# Patient Record
Sex: Female | Born: 1955 | Race: Black or African American | Hispanic: No | Marital: Married | Smoking: Former smoker
Health system: Southern US, Community
[De-identification: ages and names within clinical notes are randomized; demographics above are authoritative.]

## PROBLEM LIST (undated history)

## (undated) DIAGNOSIS — R001 Bradycardia, unspecified: Secondary | ICD-10-CM

## (undated) DIAGNOSIS — I2510 Atherosclerotic heart disease of native coronary artery without angina pectoris: Secondary | ICD-10-CM

## (undated) DIAGNOSIS — E785 Hyperlipidemia, unspecified: Secondary | ICD-10-CM

## (undated) DIAGNOSIS — M549 Dorsalgia, unspecified: Secondary | ICD-10-CM

## (undated) DIAGNOSIS — I10 Essential (primary) hypertension: Secondary | ICD-10-CM

## (undated) DIAGNOSIS — G4730 Sleep apnea, unspecified: Secondary | ICD-10-CM

## (undated) DIAGNOSIS — H409 Unspecified glaucoma: Secondary | ICD-10-CM

## (undated) DIAGNOSIS — M25552 Pain in left hip: Secondary | ICD-10-CM

## (undated) DIAGNOSIS — R51 Headache: Secondary | ICD-10-CM

## (undated) DIAGNOSIS — M25522 Pain in left elbow: Secondary | ICD-10-CM

## (undated) DIAGNOSIS — G8929 Other chronic pain: Secondary | ICD-10-CM

## (undated) DIAGNOSIS — R519 Headache, unspecified: Secondary | ICD-10-CM

## (undated) DIAGNOSIS — K219 Gastro-esophageal reflux disease without esophagitis: Secondary | ICD-10-CM

## (undated) DIAGNOSIS — M1990 Unspecified osteoarthritis, unspecified site: Secondary | ICD-10-CM

## (undated) DIAGNOSIS — F329 Major depressive disorder, single episode, unspecified: Secondary | ICD-10-CM

## (undated) DIAGNOSIS — F32A Depression, unspecified: Secondary | ICD-10-CM

## (undated) HISTORY — DX: Headache: R51

## (undated) HISTORY — DX: Atherosclerotic heart disease of native coronary artery without angina pectoris: I25.10

## (undated) HISTORY — DX: Hyperlipidemia, unspecified: E78.5

## (undated) HISTORY — DX: Sleep apnea, unspecified: G47.30

## (undated) HISTORY — DX: Unspecified osteoarthritis, unspecified site: M19.90

## (undated) HISTORY — DX: Other chronic pain: G89.29

## (undated) HISTORY — DX: Headache, unspecified: R51.9

## (undated) HISTORY — PX: ABDOMINAL HYSTERECTOMY: SHX81

## (undated) HISTORY — PX: APPENDECTOMY: SHX54

## (undated) HISTORY — DX: Gastro-esophageal reflux disease without esophagitis: K21.9

## (undated) HISTORY — DX: Major depressive disorder, single episode, unspecified: F32.9

## (undated) HISTORY — DX: Depression, unspecified: F32.A

## (undated) HISTORY — DX: Bradycardia, unspecified: R00.1

---

## 1997-08-31 ENCOUNTER — Emergency Department (HOSPITAL_COMMUNITY): Admission: EM | Admit: 1997-08-31 | Discharge: 1997-08-31 | Payer: Self-pay | Admitting: Internal Medicine

## 1997-09-15 ENCOUNTER — Emergency Department (HOSPITAL_COMMUNITY): Admission: EM | Admit: 1997-09-15 | Discharge: 1997-09-15 | Payer: Self-pay | Admitting: Emergency Medicine

## 1997-09-26 ENCOUNTER — Other Ambulatory Visit: Admission: RE | Admit: 1997-09-26 | Discharge: 1997-09-26 | Payer: Self-pay | Admitting: Family Medicine

## 1999-06-27 ENCOUNTER — Encounter: Payer: Self-pay | Admitting: Emergency Medicine

## 1999-06-27 ENCOUNTER — Emergency Department (HOSPITAL_COMMUNITY): Admission: EM | Admit: 1999-06-27 | Discharge: 1999-06-27 | Payer: Self-pay | Admitting: Emergency Medicine

## 2000-05-03 ENCOUNTER — Encounter: Payer: Self-pay | Admitting: Emergency Medicine

## 2000-05-03 ENCOUNTER — Emergency Department (HOSPITAL_COMMUNITY): Admission: EM | Admit: 2000-05-03 | Discharge: 2000-05-03 | Payer: Self-pay | Admitting: Emergency Medicine

## 2000-05-11 ENCOUNTER — Encounter: Admission: RE | Admit: 2000-05-11 | Discharge: 2000-07-02 | Payer: Self-pay | Admitting: Orthopedic Surgery

## 2000-06-23 ENCOUNTER — Ambulatory Visit (HOSPITAL_COMMUNITY): Admission: RE | Admit: 2000-06-23 | Discharge: 2000-06-23 | Payer: Self-pay

## 2001-10-30 ENCOUNTER — Emergency Department (HOSPITAL_COMMUNITY): Admission: EM | Admit: 2001-10-30 | Discharge: 2001-10-30 | Payer: Self-pay | Admitting: Emergency Medicine

## 2001-11-14 ENCOUNTER — Emergency Department (HOSPITAL_COMMUNITY): Admission: EM | Admit: 2001-11-14 | Discharge: 2001-11-14 | Payer: Self-pay | Admitting: Emergency Medicine

## 2001-12-06 ENCOUNTER — Encounter: Payer: Self-pay | Admitting: Family Medicine

## 2001-12-06 ENCOUNTER — Ambulatory Visit (HOSPITAL_COMMUNITY): Admission: RE | Admit: 2001-12-06 | Discharge: 2001-12-06 | Payer: Self-pay | Admitting: Family Medicine

## 2001-12-29 ENCOUNTER — Emergency Department (HOSPITAL_COMMUNITY): Admission: EM | Admit: 2001-12-29 | Discharge: 2001-12-29 | Payer: Self-pay | Admitting: *Deleted

## 2002-06-27 ENCOUNTER — Emergency Department (HOSPITAL_COMMUNITY): Admission: EM | Admit: 2002-06-27 | Discharge: 2002-06-27 | Payer: Self-pay | Admitting: Emergency Medicine

## 2002-06-28 ENCOUNTER — Ambulatory Visit: Admission: RE | Admit: 2002-06-28 | Discharge: 2002-06-28 | Payer: Self-pay | Admitting: Emergency Medicine

## 2002-08-03 ENCOUNTER — Encounter: Payer: Self-pay | Admitting: Emergency Medicine

## 2002-08-04 ENCOUNTER — Inpatient Hospital Stay (HOSPITAL_COMMUNITY): Admission: EM | Admit: 2002-08-04 | Discharge: 2002-08-08 | Payer: Self-pay | Admitting: Emergency Medicine

## 2002-11-05 ENCOUNTER — Emergency Department (HOSPITAL_COMMUNITY): Admission: EM | Admit: 2002-11-05 | Discharge: 2002-11-05 | Payer: Self-pay | Admitting: Emergency Medicine

## 2003-04-04 ENCOUNTER — Ambulatory Visit (HOSPITAL_COMMUNITY): Admission: RE | Admit: 2003-04-04 | Discharge: 2003-04-04 | Payer: Self-pay | Admitting: Family Medicine

## 2003-08-07 ENCOUNTER — Inpatient Hospital Stay (HOSPITAL_COMMUNITY): Admission: EM | Admit: 2003-08-07 | Discharge: 2003-08-10 | Payer: Self-pay | Admitting: Emergency Medicine

## 2003-12-18 ENCOUNTER — Ambulatory Visit: Payer: Self-pay | Admitting: Family Medicine

## 2003-12-26 ENCOUNTER — Ambulatory Visit (HOSPITAL_COMMUNITY): Admission: RE | Admit: 2003-12-26 | Discharge: 2003-12-26 | Payer: Self-pay | Admitting: Family Medicine

## 2004-01-01 ENCOUNTER — Ambulatory Visit: Payer: Self-pay | Admitting: *Deleted

## 2004-02-18 ENCOUNTER — Ambulatory Visit: Payer: Self-pay | Admitting: Family Medicine

## 2004-04-08 ENCOUNTER — Ambulatory Visit: Payer: Self-pay | Admitting: Family Medicine

## 2004-04-10 ENCOUNTER — Ambulatory Visit: Payer: Self-pay | Admitting: *Deleted

## 2004-04-10 ENCOUNTER — Ambulatory Visit: Payer: Self-pay | Admitting: Family Medicine

## 2004-04-23 ENCOUNTER — Ambulatory Visit: Payer: Self-pay | Admitting: Internal Medicine

## 2004-04-23 ENCOUNTER — Ambulatory Visit (HOSPITAL_BASED_OUTPATIENT_CLINIC_OR_DEPARTMENT_OTHER): Admission: RE | Admit: 2004-04-23 | Discharge: 2004-04-23 | Payer: Self-pay | Admitting: Family Medicine

## 2004-04-30 ENCOUNTER — Ambulatory Visit: Payer: Self-pay | Admitting: Family Medicine

## 2004-05-16 ENCOUNTER — Ambulatory Visit: Payer: Self-pay | Admitting: Family Medicine

## 2004-05-26 ENCOUNTER — Ambulatory Visit (HOSPITAL_COMMUNITY): Admission: RE | Admit: 2004-05-26 | Discharge: 2004-05-26 | Payer: Self-pay | Admitting: Family Medicine

## 2004-06-02 ENCOUNTER — Ambulatory Visit: Payer: Self-pay | Admitting: Family Medicine

## 2004-06-02 DIAGNOSIS — L439 Lichen planus, unspecified: Secondary | ICD-10-CM

## 2004-06-23 ENCOUNTER — Ambulatory Visit: Payer: Self-pay | Admitting: Family Medicine

## 2004-09-24 ENCOUNTER — Observation Stay (HOSPITAL_COMMUNITY): Admission: EM | Admit: 2004-09-24 | Discharge: 2004-09-24 | Payer: Self-pay | Admitting: Emergency Medicine

## 2004-10-06 ENCOUNTER — Ambulatory Visit: Payer: Self-pay | Admitting: Family Medicine

## 2004-10-08 ENCOUNTER — Ambulatory Visit: Payer: Self-pay | Admitting: Family Medicine

## 2004-11-17 ENCOUNTER — Ambulatory Visit: Payer: Self-pay | Admitting: Family Medicine

## 2005-01-19 ENCOUNTER — Ambulatory Visit: Payer: Self-pay | Admitting: Family Medicine

## 2005-05-06 ENCOUNTER — Ambulatory Visit: Payer: Self-pay | Admitting: Family Medicine

## 2005-06-04 ENCOUNTER — Ambulatory Visit: Payer: Self-pay | Admitting: Family Medicine

## 2005-07-06 ENCOUNTER — Ambulatory Visit: Payer: Self-pay | Admitting: Family Medicine

## 2005-09-02 ENCOUNTER — Ambulatory Visit: Payer: Self-pay | Admitting: Family Medicine

## 2005-09-03 ENCOUNTER — Ambulatory Visit: Payer: Self-pay | Admitting: *Deleted

## 2005-10-05 ENCOUNTER — Ambulatory Visit: Payer: Self-pay | Admitting: Family Medicine

## 2005-10-27 ENCOUNTER — Ambulatory Visit: Payer: Self-pay | Admitting: Family Medicine

## 2005-10-27 LAB — CONVERTED CEMR LAB: Pap Smear: NORMAL

## 2005-11-05 ENCOUNTER — Ambulatory Visit: Payer: Self-pay | Admitting: Family Medicine

## 2005-11-12 ENCOUNTER — Ambulatory Visit (HOSPITAL_COMMUNITY): Admission: RE | Admit: 2005-11-12 | Discharge: 2005-11-12 | Payer: Self-pay | Admitting: Family Medicine

## 2005-12-14 ENCOUNTER — Ambulatory Visit: Payer: Self-pay | Admitting: Family Medicine

## 2005-12-16 ENCOUNTER — Ambulatory Visit: Payer: Self-pay | Admitting: Family Medicine

## 2006-06-14 ENCOUNTER — Ambulatory Visit: Payer: Self-pay | Admitting: Family Medicine

## 2006-08-30 ENCOUNTER — Ambulatory Visit: Payer: Self-pay | Admitting: Family Medicine

## 2006-12-03 ENCOUNTER — Encounter (INDEPENDENT_AMBULATORY_CARE_PROVIDER_SITE_OTHER): Payer: Self-pay | Admitting: Family Medicine

## 2006-12-03 DIAGNOSIS — I10 Essential (primary) hypertension: Secondary | ICD-10-CM | POA: Insufficient documentation

## 2006-12-08 ENCOUNTER — Encounter (INDEPENDENT_AMBULATORY_CARE_PROVIDER_SITE_OTHER): Payer: Self-pay | Admitting: *Deleted

## 2006-12-21 ENCOUNTER — Ambulatory Visit: Payer: Self-pay | Admitting: Family Medicine

## 2006-12-23 ENCOUNTER — Ambulatory Visit: Payer: Self-pay | Admitting: Family Medicine

## 2007-01-18 ENCOUNTER — Ambulatory Visit: Payer: Self-pay | Admitting: Family Medicine

## 2007-01-18 DIAGNOSIS — J45909 Unspecified asthma, uncomplicated: Secondary | ICD-10-CM | POA: Insufficient documentation

## 2007-01-18 DIAGNOSIS — F329 Major depressive disorder, single episode, unspecified: Secondary | ICD-10-CM

## 2007-01-18 DIAGNOSIS — E119 Type 2 diabetes mellitus without complications: Secondary | ICD-10-CM

## 2007-01-18 DIAGNOSIS — G4733 Obstructive sleep apnea (adult) (pediatric): Secondary | ICD-10-CM

## 2007-03-19 ENCOUNTER — Emergency Department (HOSPITAL_COMMUNITY): Admission: EM | Admit: 2007-03-19 | Discharge: 2007-03-20 | Payer: Self-pay | Admitting: Emergency Medicine

## 2007-05-17 ENCOUNTER — Telehealth (INDEPENDENT_AMBULATORY_CARE_PROVIDER_SITE_OTHER): Payer: Self-pay | Admitting: *Deleted

## 2007-05-18 ENCOUNTER — Ambulatory Visit: Payer: Self-pay | Admitting: Nurse Practitioner

## 2007-05-18 DIAGNOSIS — H6690 Otitis media, unspecified, unspecified ear: Secondary | ICD-10-CM | POA: Insufficient documentation

## 2007-07-25 ENCOUNTER — Ambulatory Visit: Payer: Self-pay | Admitting: Family Medicine

## 2007-07-25 DIAGNOSIS — M2550 Pain in unspecified joint: Secondary | ICD-10-CM | POA: Insufficient documentation

## 2007-07-25 LAB — CONVERTED CEMR LAB
ALT: 20 units/L (ref 0–35)
Basophils Relative: 0 % (ref 0–1)
Blood Glucose, Fingerstick: 101
Chloride: 107 meq/L (ref 96–112)
Cholesterol: 145 mg/dL (ref 0–200)
Eosinophils Absolute: 0.1 10*3/uL (ref 0.0–0.7)
Eosinophils Relative: 2 % (ref 0–5)
Hemoglobin: 12.3 g/dL (ref 12.0–15.0)
Hgb A1c MFr Bld: 6.3 %
Lymphocytes Relative: 44 % (ref 12–46)
Neutro Abs: 2.7 10*3/uL (ref 1.7–7.7)
Neutrophils Relative %: 49 % (ref 43–77)
RBC: 4.34 M/uL (ref 3.87–5.11)
RDW: 15.9 % — ABNORMAL HIGH (ref 11.5–15.5)
Rhuematoid fact SerPl-aCnc: 28 intl units/mL — ABNORMAL HIGH (ref 0–20)
TSH: 1.163 microintl units/mL (ref 0.350–5.50)
Total Bilirubin: 0.3 mg/dL (ref 0.3–1.2)
Total Protein: 7.3 g/dL (ref 6.0–8.3)
VLDL: 13 mg/dL (ref 0–40)

## 2007-08-08 ENCOUNTER — Encounter (INDEPENDENT_AMBULATORY_CARE_PROVIDER_SITE_OTHER): Payer: Self-pay | Admitting: Family Medicine

## 2007-08-29 ENCOUNTER — Ambulatory Visit: Payer: Self-pay | Admitting: Family Medicine

## 2007-08-29 LAB — CONVERTED CEMR LAB: Blood Glucose, Fingerstick: 103

## 2007-09-06 ENCOUNTER — Encounter (INDEPENDENT_AMBULATORY_CARE_PROVIDER_SITE_OTHER): Payer: Self-pay | Admitting: Family Medicine

## 2007-09-12 LAB — CONVERTED CEMR LAB
Sed Rate: 40 mm/hr — ABNORMAL HIGH (ref 0–22)
Total CK: 66 units/L (ref 7–177)

## 2007-12-12 ENCOUNTER — Telehealth (INDEPENDENT_AMBULATORY_CARE_PROVIDER_SITE_OTHER): Payer: Self-pay | Admitting: *Deleted

## 2007-12-13 ENCOUNTER — Ambulatory Visit: Payer: Self-pay | Admitting: Family Medicine

## 2007-12-13 ENCOUNTER — Telehealth (INDEPENDENT_AMBULATORY_CARE_PROVIDER_SITE_OTHER): Payer: Self-pay | Admitting: Family Medicine

## 2007-12-13 DIAGNOSIS — R1084 Generalized abdominal pain: Secondary | ICD-10-CM | POA: Insufficient documentation

## 2007-12-13 DIAGNOSIS — M6740 Ganglion, unspecified site: Secondary | ICD-10-CM | POA: Insufficient documentation

## 2007-12-13 LAB — CONVERTED CEMR LAB: Blood Glucose, Fingerstick: 99

## 2007-12-15 ENCOUNTER — Encounter (INDEPENDENT_AMBULATORY_CARE_PROVIDER_SITE_OTHER): Payer: Self-pay | Admitting: Family Medicine

## 2008-01-11 ENCOUNTER — Ambulatory Visit: Payer: Self-pay | Admitting: Family Medicine

## 2008-01-13 ENCOUNTER — Ambulatory Visit: Payer: Self-pay | Admitting: Family Medicine

## 2008-01-20 ENCOUNTER — Emergency Department (HOSPITAL_COMMUNITY): Admission: EM | Admit: 2008-01-20 | Discharge: 2008-01-21 | Payer: Self-pay | Admitting: Emergency Medicine

## 2008-01-23 ENCOUNTER — Telehealth (INDEPENDENT_AMBULATORY_CARE_PROVIDER_SITE_OTHER): Payer: Self-pay | Admitting: *Deleted

## 2008-04-07 ENCOUNTER — Emergency Department (HOSPITAL_COMMUNITY): Admission: EM | Admit: 2008-04-07 | Discharge: 2008-04-08 | Payer: Self-pay | Admitting: Emergency Medicine

## 2008-04-09 ENCOUNTER — Telehealth (INDEPENDENT_AMBULATORY_CARE_PROVIDER_SITE_OTHER): Payer: Self-pay | Admitting: *Deleted

## 2008-05-22 ENCOUNTER — Ambulatory Visit: Payer: Self-pay | Admitting: Family Medicine

## 2008-05-22 DIAGNOSIS — I498 Other specified cardiac arrhythmias: Secondary | ICD-10-CM | POA: Insufficient documentation

## 2008-05-23 ENCOUNTER — Ambulatory Visit: Payer: Self-pay | Admitting: Internal Medicine

## 2008-05-23 ENCOUNTER — Ambulatory Visit: Payer: Self-pay

## 2008-05-23 ENCOUNTER — Encounter: Payer: Self-pay | Admitting: Internal Medicine

## 2008-05-23 DIAGNOSIS — R5381 Other malaise: Secondary | ICD-10-CM

## 2008-05-23 DIAGNOSIS — R5383 Other fatigue: Secondary | ICD-10-CM

## 2008-05-23 DIAGNOSIS — R002 Palpitations: Secondary | ICD-10-CM | POA: Insufficient documentation

## 2008-05-24 ENCOUNTER — Encounter (INDEPENDENT_AMBULATORY_CARE_PROVIDER_SITE_OTHER): Payer: Self-pay | Admitting: Family Medicine

## 2008-07-25 ENCOUNTER — Ambulatory Visit: Payer: Self-pay | Admitting: Nurse Practitioner

## 2008-07-25 DIAGNOSIS — N760 Acute vaginitis: Secondary | ICD-10-CM | POA: Insufficient documentation

## 2008-07-25 LAB — CONVERTED CEMR LAB
Blood in Urine, dipstick: NEGATIVE
Nitrite: NEGATIVE
Protein, U semiquant: NEGATIVE
Urobilinogen, UA: 0.2
WBC Urine, dipstick: NEGATIVE

## 2008-08-13 ENCOUNTER — Ambulatory Visit: Payer: Self-pay | Admitting: Family Medicine

## 2008-08-14 ENCOUNTER — Encounter (INDEPENDENT_AMBULATORY_CARE_PROVIDER_SITE_OTHER): Payer: Self-pay | Admitting: Family Medicine

## 2008-08-14 LAB — CONVERTED CEMR LAB
AST: 15 units/L (ref 0–37)
Albumin: 4.1 g/dL (ref 3.5–5.2)
Basophils Relative: 0 % (ref 0–1)
CO2: 24 meq/L (ref 19–32)
Chloride: 105 meq/L (ref 96–112)
Cholesterol: 144 mg/dL (ref 0–200)
HCT: 42.7 % (ref 36.0–46.0)
HDL: 42 mg/dL (ref >39)
Hemoglobin: 13.1 g/dL (ref 12.0–15.0)
MCHC: 30.7 g/dL (ref 30.0–36.0)
Neutro Abs: 2.2 10*3/uL (ref 1.7–7.7)
Neutrophils Relative %: 47 % (ref 43–77)
RDW: 15.9 % — ABNORMAL HIGH (ref 11.5–15.5)
Sodium: 141 meq/L (ref 135–145)
Total Bilirubin: 0.3 mg/dL (ref 0.3–1.2)
Total Protein: 6.9 g/dL (ref 6.0–8.3)
Triglycerides: 82 mg/dL (ref <150)
VLDL: 16 mg/dL (ref 0–40)

## 2008-08-17 ENCOUNTER — Ambulatory Visit (HOSPITAL_COMMUNITY): Admission: RE | Admit: 2008-08-17 | Discharge: 2008-08-17 | Payer: Self-pay | Admitting: Family Medicine

## 2008-12-11 ENCOUNTER — Emergency Department (HOSPITAL_COMMUNITY): Admission: EM | Admit: 2008-12-11 | Discharge: 2008-12-12 | Payer: Self-pay | Admitting: Emergency Medicine

## 2008-12-20 ENCOUNTER — Ambulatory Visit: Payer: Self-pay | Admitting: Nurse Practitioner

## 2008-12-20 DIAGNOSIS — R55 Syncope and collapse: Secondary | ICD-10-CM

## 2008-12-20 LAB — CONVERTED CEMR LAB
Bilirubin Urine: NEGATIVE
Blood Glucose, Fingerstick: 82
Glucose, Urine, Semiquant: NEGATIVE
Ketones, urine, test strip: NEGATIVE
Microalb, Ur: 0.75 mg/dL (ref 0.00–1.89)
Nitrite: NEGATIVE
Specific Gravity, Urine: 1.025
Urobilinogen, UA: 0.2
WBC Urine, dipstick: NEGATIVE

## 2008-12-21 ENCOUNTER — Encounter (INDEPENDENT_AMBULATORY_CARE_PROVIDER_SITE_OTHER): Payer: Self-pay | Admitting: Nurse Practitioner

## 2009-01-09 ENCOUNTER — Ambulatory Visit: Payer: Self-pay | Admitting: Nurse Practitioner

## 2009-01-11 ENCOUNTER — Ambulatory Visit: Payer: Self-pay | Admitting: Nurse Practitioner

## 2009-05-01 ENCOUNTER — Telehealth (INDEPENDENT_AMBULATORY_CARE_PROVIDER_SITE_OTHER): Payer: Self-pay | Admitting: Nurse Practitioner

## 2009-05-07 ENCOUNTER — Ambulatory Visit: Payer: Self-pay | Admitting: Nurse Practitioner

## 2009-05-07 DIAGNOSIS — I8390 Asymptomatic varicose veins of unspecified lower extremity: Secondary | ICD-10-CM

## 2009-05-07 LAB — CONVERTED CEMR LAB
Bilirubin Urine: NEGATIVE
Protein, U semiquant: NEGATIVE
Urobilinogen, UA: 0.2

## 2009-05-08 ENCOUNTER — Encounter (INDEPENDENT_AMBULATORY_CARE_PROVIDER_SITE_OTHER): Payer: Self-pay | Admitting: Nurse Practitioner

## 2009-06-27 ENCOUNTER — Telehealth (INDEPENDENT_AMBULATORY_CARE_PROVIDER_SITE_OTHER): Payer: Self-pay | Admitting: Nurse Practitioner

## 2009-07-08 ENCOUNTER — Encounter (INDEPENDENT_AMBULATORY_CARE_PROVIDER_SITE_OTHER): Payer: Self-pay | Admitting: *Deleted

## 2009-07-22 ENCOUNTER — Ambulatory Visit: Payer: Self-pay | Admitting: Nurse Practitioner

## 2009-07-22 DIAGNOSIS — K219 Gastro-esophageal reflux disease without esophagitis: Secondary | ICD-10-CM | POA: Insufficient documentation

## 2009-09-25 ENCOUNTER — Ambulatory Visit: Payer: Self-pay | Admitting: Nurse Practitioner

## 2009-09-25 DIAGNOSIS — M5430 Sciatica, unspecified side: Secondary | ICD-10-CM | POA: Insufficient documentation

## 2009-10-16 ENCOUNTER — Ambulatory Visit: Payer: Self-pay | Admitting: Nurse Practitioner

## 2009-10-16 DIAGNOSIS — E669 Obesity, unspecified: Secondary | ICD-10-CM

## 2009-10-16 LAB — CONVERTED CEMR LAB
ALT: 21 units/L (ref 0–35)
AST: 13 units/L (ref 0–37)
Albumin: 4.2 g/dL (ref 3.5–5.2)
Alkaline Phosphatase: 56 units/L (ref 39–117)
BUN: 15 mg/dL (ref 6–23)
Basophils Absolute: 0 10*3/uL (ref 0.0–0.1)
Basophils Relative: 1 % (ref 0–1)
Blood Glucose, Fingerstick: 107
Eosinophils Absolute: 0.1 10*3/uL (ref 0.0–0.7)
MCHC: 32.3 g/dL (ref 30.0–36.0)
MCV: 90.2 fL (ref 78.0–100.0)
Neutrophils Relative %: 45 % (ref 43–77)
Platelets: 360 10*3/uL (ref 150–400)
Potassium: 4.1 meq/L (ref 3.5–5.3)
RDW: 15.5 % (ref 11.5–15.5)
Sodium: 145 meq/L (ref 135–145)
Total Protein: 7.1 g/dL (ref 6.0–8.3)

## 2009-10-22 ENCOUNTER — Encounter (INDEPENDENT_AMBULATORY_CARE_PROVIDER_SITE_OTHER): Payer: Self-pay | Admitting: Nurse Practitioner

## 2009-11-13 ENCOUNTER — Ambulatory Visit: Payer: Self-pay | Admitting: Nurse Practitioner

## 2009-11-13 LAB — CONVERTED CEMR LAB
HDL: 43 mg/dL (ref >39)
Total CHOL/HDL Ratio: 3.3

## 2009-11-14 ENCOUNTER — Encounter (INDEPENDENT_AMBULATORY_CARE_PROVIDER_SITE_OTHER): Payer: Self-pay | Admitting: Nurse Practitioner

## 2009-11-21 ENCOUNTER — Ambulatory Visit (HOSPITAL_COMMUNITY): Admission: RE | Admit: 2009-11-21 | Discharge: 2009-11-21 | Payer: Self-pay | Admitting: Internal Medicine

## 2010-01-08 ENCOUNTER — Ambulatory Visit: Payer: Self-pay | Admitting: Nurse Practitioner

## 2010-01-10 ENCOUNTER — Ambulatory Visit: Payer: Self-pay | Admitting: Nurse Practitioner

## 2010-01-27 ENCOUNTER — Telehealth (INDEPENDENT_AMBULATORY_CARE_PROVIDER_SITE_OTHER): Payer: Self-pay | Admitting: Nurse Practitioner

## 2010-02-11 ENCOUNTER — Ambulatory Visit: Payer: Self-pay | Admitting: Nurse Practitioner

## 2010-02-11 LAB — CONVERTED CEMR LAB: Blood Glucose, Fingerstick: 97

## 2010-04-13 ENCOUNTER — Encounter: Payer: Self-pay | Admitting: Occupational Therapy

## 2010-04-15 ENCOUNTER — Emergency Department (HOSPITAL_COMMUNITY)
Admission: EM | Admit: 2010-04-15 | Discharge: 2010-04-15 | Payer: Self-pay | Source: Home / Self Care | Admitting: Emergency Medicine

## 2010-04-20 LAB — CONVERTED CEMR LAB: Blood Glucose, Fingerstick: 103

## 2010-04-22 NOTE — Letter (Signed)
Summary: *HSN Results Follow up  [facility]  [address] [place], [place] [address]   Phone: [phone]  Fax: [phone]      07/08/2009   [facility] [address] [place], [place]  [address]   Dear  Ms. Tammy [name],                            ____S.Drinkard,FNP   ____D. Gore,FNP       ____B. McPherson,MD   ____V. Rankins,MD    ____E. Mulberry,MD    ____N. [name], FNP  ____D. Reche Dixon, MD    ____K. Philipp Deputy, MD    ____Other     This letter is to inform you that your recent test(s):  _______Pap Smear    _______Lab Test     _______X-ray    _______ is within acceptable limits  _______ requires a medication change  _______ requires a follow-up lab visit  _______ requires a follow-up visit with your provider   Comments:  We have tried to reach you at [phone].  Please contact the office at your earliest convenience if you have any questions.       _________________________________________________________ If you have any questions, please contact our office                     Sincerely,  [name] [facility]

## 2010-04-22 NOTE — Assessment & Plan Note (Signed)
Summary: HTN/Diabetes   Vital Signs:  Patient profile:   55 year old female Menstrual status:  partial hysterectomy Weight:      243.1 pounds BMI:     42.54 Temp:     97.1 degrees F oral Pulse rate:   80 / minute Pulse rhythm:   regular Resp:     20 per minute BP sitting:   126 / 76  (left arm) Cuff size:   large  Vitals Entered By: [name] (February 11, 2010 9:03 AM)  Nutrition Counseling: Patient's BMI is greater than 25 and therefore counseled on weight management options. CC: follow-up visit, Hypertension Management, Abdominal Pain Is Patient Diabetic? Yes Pain Assessment Patient in pain? no      CBG Result 97 CBG Device ID B  Does patient need assistance? Functional Status Self care Ambulation Normal   Primary Care Provider:  [name]  CC:  follow-up visit, Hypertension Management, and Abdominal Pain.  History of Present Illness:  Pt into the office for follow up on blood pressure. Pt is taking blood pressure medications as ordered.  Pt did not bring meds into office with her - she was advised to bring all meds into the office with her.  Asthma History    Initial Asthma Severity Rating:    Age range: 12+ years    Symptoms: 0-2 days/week    Nighttime Awakenings: 0-2/month    Interferes w/ normal activity: no limitations    SABA use (not for EIB): 0-2 days/week    Exacerbations requiring oral systemic steroids: 0-1/year    Asthma Severity Assessment: Intermittent  Dyspepsia History:      She has no alarm features of dyspepsia including no history of melena, hematochezia, dysphagia, persistent vomiting, or involuntary weight loss > 5%.  There is a prior history of GERD.  The patient does not have a prior history of documented ulcer disease.  The dominant symptom is not heartburn or acid reflux.  An H-2 blocker medication is not currently being taken.  She has no history of a positive H. Pylori serology.    Hypertension History:      She  denies headache, chest pain, and palpitations.  She notes no problems with any antihypertensive medication side effects.  pt admits that she is taking medications as ordered.        Positive major cardiovascular risk factors include diabetes and hypertension.  Negative major cardiovascular risk factors include female age less than 54 years old, negative family history for ischemic heart disease, and non-tobacco-user status.        Further assessment for target organ damage reveals no history of ASHD, cardiac end-organ damage (CHF/LVH), stroke/TIA, peripheral vascular disease, renal insufficiency, or hypertensive retinopathy.       Allergies (verified): No Known Drug Allergies  Review of Systems General:  Complains of fatigue. CV:  Denies chest pain or discomfort. Resp:  Denies cough. GI:  Denies abdominal pain, nausea, and vomiting. MS:  Reports a fall last week while she was walking the dog.  "I didn't hurt anything". Neuro:  Complains of headaches; sometime she wakes with headache.  Physical Exam  General:  alert.  obese Head:  normocephalic.   Ears:  ear piercing(s) noted.   Lungs:  normal breath sounds.   Heart:  normal rate and regular rhythm.   Abdomen:  normal bowel sounds.   Neurologic:  alert & oriented X3.   Skin:  color normal.   Psych:  Oriented X3.  Diabetes Management Exam:    Foot Exam (with socks and/or shoes not present):       Sensory-Monofilament:          Left foot: normal          Right foot: normal   Impression & Recommendations:  Problem # 1:  HYPERTENSION (ICD-401.9) BP is doing much better. Fatigue - ? if due to clonidine Her updated medication list for this problem includes:    Furosemide 40 Mg Tabs (Furosemide) ..... One tablet by mouth daily    Lisinopril 40 Mg Tabs (Lisinopril) ..... One tablet by mouth daily for blood pressure    Clonidine Hcl 0.2 Mg Tabs (Clonidine hcl) ..... One tablet by mouth two times a day for blood pressure  **  Problem # 2:  GERD (ICD-530.81) symptoms stable Her updated medication list for this problem includes:    Protonix 40 Mg Tbec (Pantoprazole sodium) ..... One tablet by mouth daily before breakfast    Bentyl 20 Mg Tabs (Dicyclomine hcl) ..... One tablet by mouth three times a day before meals for stomach  Problem # 3:  OBSTRUCTIVE SLEEP APNEA (ICD-327.23) pt admits that she is still using c-pap machine and recently had it serviced  Problem # 4:  DIABETES MELLITUS, TYPE II (ICD-250.00) Blood sugar is doing much better  Her updated medication list for this problem includes:    Lisinopril 40 Mg Tabs (Lisinopril) ..... One tablet by mouth daily for blood pressure  Orders: Capillary Blood Glucose/CBG (82948) Hemoglobin A1C (83036)  Problem # 5:  ASTHMA NOS W/O STATUS ASTHMATICUS (ICD-493.90) MDI as needed  Her updated medication list for this problem includes:    Proventil Hfa 108 (90 Base) Mcg/act Aers (Albuterol sulfate) .Marland Kitchen... 2 puffs q 4-6h as needed 5 refills  Complete Medication List: 1)  Furosemide 40 Mg Tabs (Furosemide) .... One tablet by mouth daily 2)  Proventil Hfa 108 (90 Base) Mcg/act Aers (Albuterol sulfate) .... 2 puffs q 4-6h as needed 5 refills 3)  Potassium Chloride Crys Cr 20 Meq Cr-tabs (Potassium chloride crys cr) .... One tablet by mouth daily for potassium 4)  Lisinopril 40 Mg Tabs (Lisinopril) .... One tablet by mouth daily for blood pressure 5)  Nasacort Aq 55 Mcg/act Aers (Triamcinolone acetonide(nasal)) .Marland Kitchen.. 1 squirt q nostril once daily 5 refills 6)  Protonix 40 Mg Tbec (Pantoprazole sodium) .... One tablet by mouth daily before breakfast 7)  Entric Coated Aspirin 81mg   .... Once daily 5 refills 8)  Nystatin-triamcinolone 100000-0.1 Unit/gm-% Oint (Nystatin-triamcinolone) .Marland Kitchen.. 1 application topically two times a day for irritation 9)  Clonidine Hcl 0.2 Mg Tabs (Clonidine hcl) .... One tablet by mouth two times a day for blood pressure ** 10)  Bentyl 20 Mg  Tabs (Dicyclomine hcl) .... One tablet by mouth three times a day before meals for stomach 11)  Neurontin 300 Mg Caps (Gabapentin) .... One capsule by mouth nightly for hip pain  Hypertension Assessment/Plan:      The patient's hypertensive risk group is category C: Target organ damage and/or diabetes.  Her calculated 10 year risk of coronary heart disease is 13 %.  Today's blood pressure is 126/76.  Her blood pressure goal is < 130/80.   Patient Instructions: 1)  Blood pressure- much better today. 2)  Fatigue - may actually be due to clonidine (blood pressure medication) This is a side effect of the medication.  Hopefully it will improve.  If fatigue continues or is too much then we can consider  changing medications.  Do not STOP abruptly taking it as it is doing well for your blood pressure but that is a consideration if fatigue does not improve. 3)  Diabetes - doing well.   4)  You have declined the flu vaccine today.  Remember this is recommended for diabetes and asthmatic.  If you change your mind return to this office for nurse visit. 5)  Follow up in 3 months for blood pressure or sooner if necessary   Orders Added: 1)  Capillary Blood Glucose/CBG [82948] 2)  Est. Patient Level III [56213] 3)  Hemoglobin A1C [83036]     Last LDL:                                                 85 (11/13/2009 9:50:00 PM)          Diabetic Foot Exam Last Podiatry Exam Date: 08/13/2008    10-g (5.07) Semmes-Weinstein Monofilament Test Performed by: [name]          Right Foot          Left Foot Visual Inspection               Test Control      normal         normal Site 1         normal         normal Site 2         normal         normal Site 3         normal         normal Site 4         normal         normal Site 5         normal         normal Site 6         normal         normal Site 7         normal         normal Site 8         normal         normal Site 9          normal         normal Site 10         normal         normal  Impression      normal         normal   Laboratory Results   Blood Tests   Date/Time Received: February 11, 2010 9:31 AM   HGBA1C: 5.9%   (Normal Range: Non-Diabetic - 3-6%   Control Diabetic - 6-8%) CBG Random:: 97     Prevention & Chronic Care Immunizations   Influenza vaccine: Refused  (02/11/2010)   Influenza vaccine deferral: Refused  (12/20/2008)    Tetanus booster: 11/22/2003: Historical    Pneumococcal vaccine: Not documented   Pneumococcal vaccine deferral: Refused  (12/20/2008)  Colorectal Screening   Hemoccult: Not documented    Colonoscopy: Not documented  Other Screening   Pap smear:  Specimen Adequacy: Satisfactory for evaluation.   Interpretation/Result:Negative for intraepithelial Lesion or Malignancy.     (10/16/2009)   Pap smear due: 10/2010    Mammogram: ASSESSMENT: Negative - BI-RADS  1^MM DIGITAL SCREENING  (11/21/2009)   Smoking status: quit  (10/16/2009)  Diabetes Mellitus   HgbA1C: 5.9  (02/11/2010)    Eye exam: Not documented    Foot exam: yes  (02/11/2010)   High risk foot: Not documented   Foot care education: Not documented    Urine microalbumin/creatinine ratio: Not documented  Lipids   Total Cholesterol: 140  (11/13/2009)   LDL: 85  (11/13/2009)   LDL Direct: Not documented   HDL: 43  (11/13/2009)   Triglycerides: 58  (11/13/2009)  Hypertension   Last Blood Pressure: 126 / 76  (02/11/2010)   Serum creatinine: 0.51  (10/16/2009)   Serum potassium 4.1  (10/16/2009)  Self-Management Support :    Diabetes self-management support: Not documented    Hypertension self-management support: Not documented

## 2010-04-22 NOTE — Letter (Signed)
Summary: Handout Printed  Printed Handout:  - Sciatica 

## 2010-04-22 NOTE — Letter (Signed)
Summary: *HSN Results Follow up  [facility]  [address] [place], [place] [address]   Phone: [phone]  Fax: [phone]      10/22/2009   [facility] [address] [place], [place]  [address]   Dear  Ms. Ashlley Lefevers,                            ____S.Drinkard,FNP   ____D. Gore,FNP       ____B. McPherson,MD   ____V. Rankins,MD    ____E. Mulberry,MD    _X___N. [name], FNP  ____D. Reche Dixon, MD    ____K. Philipp Deputy, MD    ____Other     This letter is to inform you that your recent test(s):  ___X____Pap Smear    ___X____Lab Test     _______X-ray    ___X____ is within acceptable limits  _______ requires a medication change  _______ requires a follow-up lab visit  _______ requires a follow-up visit with your provider   Comments: Labs and Pap Smear done during recent office visit are normal.        _________________________________________________________ If you have any questions, please contact our office [phone].                    Sincerely,    [name] [facility]

## 2010-04-22 NOTE — Progress Notes (Signed)
Summary: TIGHTNESS IN CHEST  Phone Note Call from Patient   Summary of Call: please call Patient she is having respiratory problems. phone [phone] & [phone] Initial call taken by: Domenic Polite,  January 27, 2010 3:11 PM  Follow-up for Phone Call        called [phone].  The call you are trying to make does not except unidentified callers. [name]  January 27, 2010 5:13 PM   Additional Follow-up for Phone Call Additional follow up Details #1::        Feels better today --  had been having tightness in chest and trouble catching her breath.  Has episodes  on and off recently.   Started about three months ago, is sometimes set off by walking the dogs or just laying down, no set pattern.  Sometimes has chest pain, under her left breast or midsternal, non-radiating.  Has occasional nausea with these episodes.    Is currently asymptomatic.  Advised that if symptoms reoccur, to call 911 -- not to wait for a call back- verbalized understanding.  Confirmed appt. for 02/11/10. Additional Follow-up by: Dutch Quint RN,  January 28, 2010 3:24 PM    Additional Follow-up for Phone Call Additional follow up Details #2::    Review pt's history & meds -sleep apnea: does she have a c-pap and is she wearing it?   -Asthma/reactive airway: does she have her inhaler?  she should be using it when she feel she chest tightness. change of weather may be precipitating this  Follow-up by: [name],  January 29, 2010 8:01 AM  Additional Follow-up for Phone Call Additional follow up Details #3:: Details for Additional Follow-up Action Taken: Line is busy.  Dutch Quint RN  January 29, 2010 9:47 AM  Left message with female for pt. to return call.  Dutch Quint RN  February 04, 2010 12:54 PM  Using c-pap, having no problems with it.  Advised of provider's response -- Does not know where her inhaler is, hasn't been using it.  I called [place] pharmacy -- still has refills, requested  filling for pt.  Pt. aware of refill available.  Dutch Quint RN  February 05, 2010 10:55 AM

## 2010-04-22 NOTE — Assessment & Plan Note (Signed)
Summary: 4 WEEK FU ON BP///KT   Nurse Visit   Vital Signs:  Patient profile:   55 year old female Menstrual status:  partial hysterectomy Temp:     97 degrees F oral Pulse rate:   80 / minute Pulse rhythm:   regular Resp:     20 per minute BP sitting:   144 / 80  (right arm)  Vitals Entered By: Dutch Quint RN (November 13, 2009 3:25 PM)  Patient Instructions: 1)  Reviewed with Jesse Fall FNP 2)  Blood pressure is much better -- was 144/80 today; goal is less than 140/85. 3)  Watch salt intake, decrease as much as possible 4)  Increase intentional exercise 5)  No change in medications, continue to take them as ordered. 6)  Follow-up with Jesse Fall in 3 months.   Primary Care Provider:  [name]  CC:  f/u BP check and labs.  History of Present Illness: States she took her meds this morning and is fasting.  Last visit had clonidine increased, denies any problems.     Review of Systems       states she has swollen ankles intermittently.  Says headaches come and go, no precipitating factors noted. General:  Complains of sleep disorder; has sleep apnea.   Physical Exam  General:  alert, well-developed, well-nourished, well-hydrated, and overweight-appearing.     Impression & Recommendations:  Problem # 1:  HYPERTENSION (ICD-401.9)  Her updated medication list for this problem includes:    Furosemide 40 Mg Tabs (Furosemide) ..... One tablet by mouth daily    Lisinopril 40 Mg Tabs (Lisinopril) ..... One tablet by mouth daily for blood pressure    Clonidine Hcl 0.2 Mg Tabs (Clonidine hcl) ..... One tablet by mouth two times a day for blood pressure **  Complete Medication List: 1)  Furosemide 40 Mg Tabs (Furosemide) .... One tablet by mouth daily 2)  Proventil Hfa 108 (90 Base) Mcg/act Aers (Albuterol sulfate) .... 2 puffs q 4-6h as needed 5 refills 3)  Potassium Chloride Crys Cr 20 Meq Cr-tabs (Potassium chloride crys cr) .... One tablet by mouth daily for  potassium 4)  Lisinopril 40 Mg Tabs (Lisinopril) .... One tablet by mouth daily for blood pressure 5)  Nasacort Aq 55 Mcg/act Aers (Triamcinolone acetonide(nasal)) .Marland Kitchen.. 1 squirt q nostril once daily 5 refills 6)  Protonix 40 Mg Tbec (Pantoprazole sodium) .... One tablet by mouth daily before breakfast 7)  Entric Coated Aspirin 81mg   .... Once daily 5 refills 8)  Nystatin-triamcinolone 100000-0.1 Unit/gm-% Oint (Nystatin-triamcinolone) .Marland Kitchen.. 1 application topically two times a day for irritation 9)  Clonidine Hcl 0.2 Mg Tabs (Clonidine hcl) .... One tablet by mouth two times a day for blood pressure ** 10)  Bentyl 20 Mg Tabs (Dicyclomine hcl) .... One tablet by mouth three times a day before meals for stomach 11)  Neurontin 300 Mg Caps (Gabapentin) .... One capsule by mouth nightly for hip pain  Other Orders: T-Lipid Profile (04540-98119)  CC: f/u BP check and labs Pain Assessment Patient in pain? yes     Location: rt leg, hip Intensity: 5 Type: throbbing Onset of pain  Constant  Does patient need assistance? Functional Status Self care Ambulation Normal   Allergies: No Known Drug Allergies  Orders Added: 1)  Est. Patient Level I [14782] 2)  T-Lipid Profile [[id number]]

## 2010-04-22 NOTE — Letter (Signed)
Summary: Lipid Letter  [facility]  [address] [place], [place] [address]   Phone: [phone]  Fax: [phone]    11/14/2009  [facility] D[facility] [address] [place], [place]  [address]  Dear Nolberto Hanlon:  We have carefully reviewed your last lipid profile from 11/13/2009 and the results are noted below with a summary of recommendations for lipid management.    Cholesterol:       140     Goal: less than 200   HDL "good" Cholesterol:   43     Goal: greater than 40   LDL "bad" Cholesterol:   85     Goal: less than 130   Triglycerides:       58     Goal: less than 150    Cholesterol labs are ok.     Current Medications: 1)    Furosemide 40 Mg  Tabs (Furosemide) .... One tablet by mouth daily 2)    Proventil Hfa 108 (90 Base) Mcg/act  Aers (Albuterol sulfate) .... 2 puffs q 4-6h as needed 5 refills 3)    Potassium Chloride Crys Cr 20 Meq Cr-tabs (Potassium chloride crys cr) .... One tablet by mouth daily for potassium 4)    Lisinopril 40 Mg Tabs (Lisinopril) .... One tablet by mouth daily for blood pressure 5)    Nasacort Aq 55 Mcg/act  Aers (Triamcinolone acetonide(nasal)) .Marland Kitchen.. 1 squirt q nostril once daily 5 refills 6)    Protonix 40 Mg  Tbec (Pantoprazole sodium) .... One tablet by mouth daily before breakfast 7)    Entric Coated Aspirin 81mg   .... Once daily 5 refills 8)    Nystatin-triamcinolone 100000-0.1 Unit/gm-% Oint (Nystatin-triamcinolone) .Marland Kitchen.. 1 application topically two times a day for irritation 9)    Clonidine Hcl 0.2 Mg Tabs (Clonidine hcl) .... One tablet by mouth two times a day for blood pressure ** 10)    Bentyl 20 Mg Tabs (Dicyclomine hcl) .... One tablet by mouth three times a day before meals for stomach 11)    Neurontin 300 Mg Caps (Gabapentin) .... One capsule by mouth nightly for hip pain  If you have any questions, please call. We appreciate being able to work with you.   Sincerely,    [facility] [name]

## 2010-04-22 NOTE — Progress Notes (Signed)
Summary: Office Visit//DEPRESSION SCREENING  Office Visit//DEPRESSION SCREENING   Imported By: Arta Bruce 10/17/2009 15:38:41  _____________________________________________________________________  External Attachment:    Type:   Image     Comment:   External Document

## 2010-04-22 NOTE — Progress Notes (Signed)
Summary: BLEEDING   Phone Note Call from Patient Call back at Home Phone [phone] Call back at [phone] CELL   Summary of Call: Tammy [name] PT. Tammy [name] SAYS THAT SHE HAD A HYSTEROCTEMY SOME YEARS AGO AND JUST TODAY SHE WAS FEELING PRESSURE AND SHE WENT TO THE BATHROOM AND SHE IS NOW BLEEDING. THE PRESSURE IS AT THE BOTTOM OF HER STOMACH IT FEELS LIKE SHE HAS TO PEE Initial call taken by: Leodis Rains,  May 01, 2009 1:03 PM  Follow-up for Phone Call        [name]  May 02, 2009 4:31 PM   tried calling pt but no answer .Marland KitchenMarland KitchenArmenia Shannon  May 03, 2009 4:28 PM  [name]  May 06, 2009 12:57 PM  called [phone] no answer.  called [phone] the number you have entered is not valid.  Pt is coming in tomorrow for this issue @ 11:45  Additional Follow-up for Phone Call Additional follow up Details #1::        will see pt at appt on 05/07/2009 since unable to reach by phone Additional Follow-up by: [name],  May 06, 2009 1:49 PM

## 2010-04-22 NOTE — Progress Notes (Signed)
Summary: Lasix refill  Medications Added FUROSEMIDE 40 MG  TABS (FUROSEMIDE) One tablet by mouth daily       Phone Note Call from Patient Call back at [facility] Phone [phone] Call back at [phone]   Summary of Call: The pt needs more refills from thefurosemide 40 mg [facility]) [name] FNP Initial call taken by: [name],  June 27, 2009 2:54 PM  Follow-up for Phone Call        forward to N. [name], fnp last filled 08/13/2008 #30 with 5 refills Follow-up by: [name],  June 28, 2009 8:44 AM  Additional Follow-up for Phone Call Additional follow up Details #1::        refill sent to the pharmacy  notify pt to check there for availability Additional Follow-up by: [name],  June 28, 2009 10:31 AM    Additional Follow-up for Phone Call Additional follow up Details #2::    no answer................Marland KitchenMikey College CMA  June 28, 2009 11:37 AM no answer unable to leae msg. [name]  July 04, 2009 10:19 AM   [name]  July 08, 2009 5:10 PM called [phone] no answer will mail letter. Follow-up by: [name],  July 08, 2009 5:10 PM  New/Updated Medications: FUROSEMIDE 40 MG  TABS (FUROSEMIDE) One tablet by mouth daily Prescriptions: FUROSEMIDE 40 MG  TABS (FUROSEMIDE) One tablet by mouth daily  #30 x 5   Entered and Authorized by:   [name]   Signed by:   [name] on 06/28/2009   Method used:   Faxed to ...       [facility] (retail)       [address] [place], [place]  [address]       Ph: [phone]       Fax: [phone]   RxID:   [id number]

## 2010-04-22 NOTE — Assessment & Plan Note (Signed)
Summary: Acute - Sciatica   Vital Signs:  Patient profile:   55 year old female Menstrual status:  partial hysterectomy Weight:      245.9 pounds BMI:     43.03 Temp:     97.6 degrees F oral Pulse rate:   74 / minute Pulse rhythm:   regular Resp:     16 per minute BP sitting:   181 / 86  (left arm) Cuff size:   large  Vitals Entered By: [name] (September 25, 2009 3:22 PM) CC: pain in upper thigh into buttocks, stinging and burning. x 3 weeks...lower back numbness and tightness feels as if she is swelling, Hypertension Management, Abdominal Pain Is Patient Diabetic? No Pain Assessment Patient in pain? no       Does patient need assistance? Functional Status Self care Ambulation Normal   Primary Care Provider:  [name]  CC:  pain in upper thigh into buttocks, stinging and burning. x 3 weeks...lower back numbness and tightness feels as if she is swelling, Hypertension Management, and Abdominal Pain.  History of Present Illness:  Pt into the office with complaints of r right leg pain started 3 weeks ago sharp pain that starts in the buttocks (right) and radiates down the leg Never had this problems before but pt has had low back pain Left leg is not affected Pt is employed in healthcare and position requires bending.  no lifing of pt Admits she does sleep on her right side at night Denies crossing legs Denies wallet of placing anything in her back pockets  Dyspepsia History:      She has no alarm features of dyspepsia including no history of melena, hematochezia, dysphagia, persistent vomiting, or involuntary weight loss > 5%.  There is a prior history of GERD.  The patient does not have a prior history of documented ulcer disease.  The dominant symptom is heartburn or acid reflux.  An H-2 blocker medication is not currently being taken.  She has no history of a positive H. Pylori serology.  No previous upper endoscopy has been done.    Hypertension  History:      She denies headache, chest pain, and palpitations.  She notes no problems with any antihypertensive medication side effects.  Pt reports that she has been takig her blood pressure medications as ordered.        Positive major cardiovascular risk factors include diabetes and hypertension.  Negative major cardiovascular risk factors include female age less than 7 years old, negative family history for ischemic heart disease, and non-tobacco-user status.      Allergies (verified): No Known Drug Allergies  Review of Systems CV:  Denies chest pain or discomfort. Resp:  Denies cough. GI:  Denies abdominal pain, nausea, and vomiting. Neuro:  Complains of tingling; right posterior thigh with radiation down into the leg.  Physical Exam  General:  alert.   Head:  normocephalic.   Eyes:  pupils reactive to light.   Lungs:  normal breath sounds.   Heart:  normal rate and regular rhythm.   Abdomen:  normal bowel sounds.   Msk:  up to the exam table no limits Neurologic:  alert & oriented X3.   limping gait   Detailed Back/Spine Exam  General:    obese.    Lumbosacral Exam:  Sitting Straight Leg Raise:    Right:  positive    Left:  negative   Impression & Recommendations:  Problem # 1:  SCIATICA, RIGHT (  ICD-724.3) advised pt of dx will need to start neurontin read handout  Problem # 2:  HYPERTENSION (ICD-401.9) BP is very elevated today pt to come for recheck on next week Her updated medication list for this problem includes:    Furosemide 40 Mg Tabs (Furosemide) ..... One tablet by mouth daily    Lisinopril 40 Mg Tabs (Lisinopril) ..... One tablet by mouth daily for blood pressure    Clonidine Hcl 0.1 Mg Tabs (Clonidine hcl) .Marland Kitchen... Take 1 tablet by mouth every 12 hours  Complete Medication List: 1)  Furosemide 40 Mg Tabs (Furosemide) .... One tablet by mouth daily 2)  Proventil Hfa 108 (90 Base) Mcg/act Aers (Albuterol sulfate) .... 2 puffs q 4-6h as needed  5 refills 3)  Potassium Chloride Crys Cr 20 Meq Cr-tabs (Potassium chloride crys cr) .... One tablet by mouth daily for potassium 4)  Lisinopril 40 Mg Tabs (Lisinopril) .... One tablet by mouth daily for blood pressure 5)  Nasacort Aq 55 Mcg/act Aers (Triamcinolone acetonide(nasal)) .Marland Kitchen.. 1 squirt q nostril once daily 5 refills 6)  Protonix 40 Mg Tbec (Pantoprazole sodium) .... One tablet by mouth daily before breakfast 7)  Entric Coated Aspirin 81mg   .... Once daily 5 refills 8)  Nystatin-triamcinolone 100000-0.1 Unit/gm-% Oint (Nystatin-triamcinolone) .Marland Kitchen.. 1 application topically two times a day for irritation 9)  Clonidine Hcl 0.1 Mg Tabs (Clonidine hcl) .... Take 1 tablet by mouth every 12 hours 10)  Bentyl 20 Mg Tabs (Dicyclomine hcl) .... One tablet by mouth three times a day before meals for stomach 11)  Neurontin 300 Mg Caps (Gabapentin) .... One capsule by mouth nightly for hip pain  Hypertension Assessment/Plan:      The patient's hypertensive risk group is category C: Target organ damage and/or diabetes.  Her calculated 10 year risk of coronary heart disease is 20 %.  Today's blood pressure is 181/86.  Her blood pressure goal is < 130/80.  Patient Instructions: 1)  Schedule an appointment for a complete physical exam 2)  Come fasting before this appointment for labs. 3)  will need vaginal exam, mammogram, u/a, PHQ-9 score 4)  Follow up in 1 week for a blood pressure check. Take your medications before this visit Prescriptions: NEURONTIN 300 MG CAPS (GABAPENTIN) One capsule by mouth nightly for hip pain  #30 x 0   Entered and Authorized by:   [name]   Signed by:   [name] on 09/25/2009   Method used:   Print then Give to Patient   RxID:   [id number]

## 2010-04-22 NOTE — Assessment & Plan Note (Signed)
Summary: Acute Cystitis   Vital Signs:  Patient profile:   55 year old female Menstrual status:  partial hysterectomy Weight:      237.9 pounds BMI:     41.63 BSA:     2.10 Temp:     97.7 degrees F oral Pulse rate:   67 / minute Pulse rhythm:   regular Resp:     16 per minute BP sitting:   149 / 93  (left arm) Cuff size:   large  Vitals Entered By: [name] (May 07, 2009 11:54 AM) CC: refill on medications...last week pt had blood come from vagina area with clots and feeling pressure felt like she had to urinate with no urine..pain inleft knee Is Patient Diabetic? No Pain Assessment Patient in pain? no       Does patient need assistance? Functional Status Self care Ambulation Normal     Menstrual Status partial hysterectomy Last PAP Result Normal   Primary Care Provider:  Beverley Fiedler MD  CC:  refill on medications...last week pt had blood come from vagina area with clots and feeling pressure felt like she had to urinate with no urine..pain inleft knee.  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 CV:  Denies chest pain or discomfort. Resp:  Denies cough. GI:  Complains of abdominal pain, nausea, and vomiting. GU:  Complains of dysuria; denies incontinence and nocturia. Derm:  Complains of itching and lesion(s); lower  extremities.  Physical Exam  General:  alert.   Head:  normocephalic.   Lungs:  normal breath sounds.   Heart:  normal rate and regular rhythm.   Abdomen:  normal bowel sounds.  no tenderness with palpation Skin:  varicosities to lower extremities Psych:  Oriented X3.     Impression & Recommendations:  Problem # 1:  ACUTE CYSTITIS (ICD-595.0) pt instructed to drink plenty of water will send urine for culture if continues advised pt that she will need a vaginal exam Her updated medication list for this problem includes:    Macrobid 100 Mg Caps (Nitrofurantoin monohyd macro) ..... One capsule by mouth two times a day for infection  Orders: UA Dipstick w/o Micro (automated)  (81003) T-Culture, Urine ([id number])  Problem # 2:  VARICOSE VEINS, LOWER EXTREMITIES (ICD-454.9) ted hose (large) given to pt with instructions for use  Complete Medication List: 1)  Furosemide 40 Mg Tabs (Furosemide) .... Once daily 5 refills 2)  Proventil Hfa 108 (90 Base) Mcg/act Aers (Albuterol sulfate) .... 2 puffs q 4-6h as needed 5 refills 3)  Potassium Chloride Crys Cr 20 Meq Cr-tabs (Potassium chloride crys cr) .... One tablet by mouth daily for potassium 4)  Lisinopril 40 Mg Tabs (Lisinopril) .... One tablet by mouth daily for blood pressure 5)  Nasacort Aq 55 Mcg/act Aers (Triamcinolone acetonide(nasal)) .Marland Kitchen.. 1 squirt q nostril once daily 5 refills 6)  Protonix 40 Mg Tbec (Pantoprazole sodium) .... One tablet by mouth daily before breakfast 7)  Entric Coated Aspirin 81mg   .... Once daily 5 refills 8)  Nystatin-triamcinolone 100000-0.1 Unit/gm-% Oint (Nystatin-triamcinolone) .Marland Kitchen.. 1 application topically two times a day for irritation 9)  Clonidine Hcl 0.1 Mg Tabs (Clonidine hcl) .... Take 1 tablet by mouth every 12 hours (per dr.klein). 10)  Bentyl 20 Mg Tabs (Dicyclomine hcl) .... One tablet by mouth three times a day before meals for stomach 11)  Macrobid 100 Mg Caps (Nitrofurantoin monohyd  macro) .... One capsule by mouth two times a day for infection  Patient Instructions: 1)  Your urine will be sent to the lab to determine the type of infection. 2)  If you need additional medication you will be notified. 3)  Wear the TED hose daily while you are up working. You may remove at night 4)  Follow up if bleeding continues or worsens after you take the medication Prescriptions: MACROBID 100 MG CAPS (NITROFURANTOIN MONOHYD MACRO) One capsule by mouth two times a day for infection  #14 x 0   Entered and Authorized by:   [name]   Signed by:   [name] on 05/07/2009   Method used:   Faxed to ...       [facility] (retail)       [address] [place], [place]  [address]       Ph: [phone]       Fax: [phone]   RxID:   [id number]   Laboratory Results   Urine Tests  Date/Time Received: May 07, 2009 12:10 PM  Date/Time Reported: May 07, 2009 12:10 PM   Routine Urinalysis   Color: lt. yellow Appearance: Clear Glucose: negative   (Normal Range: Negative) Bilirubin: negative   (Normal Range: Negative) Ketone: negative   (Normal Range: Negative) Spec. Gravity: 1.020   (Normal Range: 1.003-1.035) Blood: trace-lysed   (Normal Range: Negative) pH: 5.0   (Normal Range: 5.0-8.0) Protein: negative   (Normal Range: Negative) Urobilinogen: 0.2   (Normal Range: 0-1) Nitrite: negative   (Normal Range: Negative) Leukocyte Esterace: trace   (Normal Range: Negative)

## 2010-04-22 NOTE — Assessment & Plan Note (Signed)
Summary: Complete Physical Exam   Vital Signs:  Patient profile:   55 year old female Menstrual status:  partial hysterectomy Height:      63.50 inches Weight:      244.3 pounds BMI:     42.75 Temp:     97.8 degrees F oral Pulse rate:   69 / minute Pulse rhythm:   regular Resp:     16 per minute BP sitting:   158 / 88  (left arm) Cuff size:   large  Vitals Entered By: [name] (October 16, 2009 3:34 PM)  Nutrition Counseling: Patient's BMI is greater than 25 and therefore counseled on weight management options. CC: CPP, Hypertension Management, Abdominal Pain Is Patient Diabetic? Yes Pain Assessment Patient in pain? no      CBG Result 107 CBG Device ID A  Does patient need assistance? Functional Status Self care Ambulation Normal   Primary Care Provider:  [name]  CC:  CPP, Hypertension Management, and Abdominal Pain.  History of Present Illness:  Pt into the office for a complete physical exam  PAP - many years since she had a pap smear no family hx of cervical cancer Post menopausal   Mammogram - no mammogram in many years No family history of breast cancer  Optho - last eye exam over 2 years ago. she was prescribed glasses but she does not wear them.  Dental - no recent dental exam  Diabetes Management History:      The patient is a 55 years old female who comes in for evaluation of DM Type 2.  She has not been enrolled in the "Diabetic Education Program".  She states understanding of dietary principles and is following her diet appropriately.  No sensory loss is reported.  Self foot exams are not being performed.  She is checking home blood sugars.  She says that she is not exercising regularly.        Hypoglycemic symptoms are not occurring.  No hyperglycemic symptoms are reported.        No changes have been made to her treatment plan since last visit.    Dyspepsia History:      She has no alarm features of dyspepsia including no  history of melena, hematochezia, dysphagia, persistent vomiting, or involuntary weight loss > 5%.  There is a prior history of GERD.  The patient does not have a prior history of documented ulcer disease.  The dominant symptom is heartburn or acid reflux.  An H-2 blocker medication is not currently being taken.  She has no history of a positive H. Pylori serology.  No previous upper endoscopy has been done.    Hypertension History:      She denies headache, chest pain, and palpitations.  She notes no problems with any antihypertensive medication side effects.        Positive major cardiovascular risk factors include diabetes and hypertension.  Negative major cardiovascular risk factors include female age less than 71 years old, negative family history for ischemic heart disease, and non-tobacco-user status.        Further assessment for target organ damage reveals no history of ASHD, cardiac end-organ damage (CHF/LVH), stroke/TIA, peripheral vascular disease, renal insufficiency, or hypertensive retinopathy.       Habits & Providers  Alcohol-Tobacco-Diet     Alcohol drinks/day: 0     Tobacco Status: quit     Year Quit: 2004     Passive Smoke Exposure: yes  Exercise-Depression-Behavior  Does Patient Exercise: no     Drug Use: no     Seat Belt Use: 100     Sun Exposure: occasionally  Comments: PHQ-9 score = 0  Allergies: No Known Drug Allergies  Review of Systems General:  Denies fever. Eyes:  Denies blurring. ENT:  Denies earache. CV:  Denies chest pain or discomfort. Resp:  Denies cough. GI:  Denies abdominal pain, nausea, and vomiting. GU:  Denies dysuria. MS:  Denies joint pain. Derm:  Denies itching. Neuro:  Denies headaches. Psych:  Denies depression.  Physical Exam  General:  alert and overweight-appearing.   Head:  normocephalic.   Eyes:  pupils equal, pupils round, and pupils reactive to light.   Ears:  bil TM with bony landmarks present Nose:  no nasal  discharge.   Mouth:  pharynx pink and moist and fair dentition.   Neck:  supple.   Chest Wall:  no mass.   Breasts:  skin/areolae normal, no masses, and no abnormal thickening.   Lungs:  normal breath sounds.   Heart:  normal rate and regular rhythm.   Abdomen:  soft, non-tender, and normal bowel sounds.  obese Rectal:  REFUSED Msk:  normal ROM.   Pulses:  R dorsalis pedis normal and L dorsalis pedis normal.   Extremities:  no edema Neurologic:  alert & oriented X3 and gait normal.   Skin:  color normal.   Psych:  Oriented X3.    Pelvic Exam  Vulva:      normal appearance.   Urethra and Bladder:      Urethra--normal.  Bladder--normal.   Vagina:      physiologic discharge, post-menopausal.   Cervix:      midposition.   Uterus:      smooth.   Adnexa:      nontender bilaterally.   Rectum:      REFUSED  Diabetes Management Exam:    Foot Exam (with socks and/or shoes not present):       Inspection:          Left foot: normal          Right foot: normal       Nails:          Left foot: normal          Right foot: normal    Impression & Recommendations:  Problem # 1:  ROUTINE GYNECOLOGICAL EXAMINATION (ICD-V72.31) labs done except fasting PAP done rec optho exam and dental exam Orders: Hemoccult Guaiac-1 spec.(in office) ([address]2270) Rapid HIV  ([address]) T- GC Chlamydia ([address])  Problem # 2:  UNSPECIFIED BREAST SCREENING (ICD-V76.10) self breast exam placcard given mammogram given Orders: Mammogram (Screening) (Mammo)  Problem # 3:  OBESITY (ICD-278.00)  Orders: T-TSH ([id number])  Problem # 4:  HYPERTENSION (ICD-401.9) BP elevated will increase clonidine Her updated medication list for this problem includes:    Furosemide 40 Mg Tabs (Furosemide) ..... One tablet by mouth daily    Lisinopril 40 Mg Tabs (Lisinopril) ..... One tablet by mouth daily for blood pressure    Clonidine Hcl 0.2 Mg Tabs (Clonidine hcl) ..... One tablet by mouth two times a day for  blood pressure **  Orders: T-Comprehensive Metabolic Panel ([id number]) T-CBC w/Diff ([id number])  Complete Medication List: 1)  Furosemide 40 Mg Tabs (Furosemide) .... One tablet by mouth daily 2)  Proventil Hfa 108 (90 Base) Mcg/act Aers (Albuterol sulfate) .... 2 puffs q 4-6h as needed 5 refills 3)  Potassium Chloride Crys Cr 20 Meq  Cr-tabs (Potassium chloride crys cr) .... One tablet by mouth daily for potassium 4)  Lisinopril 40 Mg Tabs (Lisinopril) .... One tablet by mouth daily for blood pressure 5)  Nasacort Aq 55 Mcg/act Aers (Triamcinolone acetonide(nasal)) .Marland Kitchen.. 1 squirt q nostril once daily 5 refills 6)  Protonix 40 Mg Tbec (Pantoprazole sodium) .... One tablet by mouth daily before breakfast 7)  Entric Coated Aspirin 81mg   .... Once daily 5 refills 8)  Nystatin-triamcinolone 100000-0.1 Unit/gm-% Oint (Nystatin-triamcinolone) .Marland Kitchen.. 1 application topically two times a day for irritation 9)  Clonidine Hcl 0.2 Mg Tabs (Clonidine hcl) .... One tablet by mouth two times a day for blood pressure ** 10)  Bentyl 20 Mg Tabs (Dicyclomine hcl) .... One tablet by mouth three times a day before meals for stomach 11)  Neurontin 300 Mg Caps (Gabapentin) .... One capsule by mouth nightly for hip pain  Diabetes Management Assessment/Plan:      Her blood pressure goal is < 130/80.    Hypertension Assessment/Plan:      The patient's hypertensive risk group is category C: Target organ damage and/or diabetes.  Her calculated 10 year risk of coronary heart disease is 17 %.  Today's blood pressure is 158/88.  Her blood pressure goal is < 130/80.  Patient Instructions: 1)  Blood pressure is still elevated. 2)  Will increase clonidine to 0.2mg  by mouth daily 3)  All other medications will stay the same 4)  Keep your appointment for mammogram 5)  Follow up for Nurse Triage with Aggie Cosier - Blood pressure check and labs in 4 weeks. Do not eat after midnight before this appointment.  You will need  lipids. 6)  Take your medications before this visit 7)  Goal <140/85 Prescriptions: PROVENTIL HFA 108 (90 BASE) MCG/ACT  AERS (ALBUTEROL SULFATE) 2 puffs q 4-6h as needed 5 refills  #1 x 2   Entered and Authorized by:   [name]   Signed by:   [name] on 10/16/2009   Method used:   Faxed to ...       [facility] - [facility] (retail)       [address] [place], [place]  [address]       Ph: [phone]       Fax: [phone]   RxID:   [id number] BENTYL 20 MG TABS (DICYCLOMINE HCL) One tablet by mouth three times a day before meals for stomach  #90 x 5   Entered and Authorized by:   [name]   Signed by:   [name] on 10/16/2009   Method used:   Faxed to ...       [facility] - [facility] (retail)       [address] [place], [place]  [address]       Ph: [phone]       Fax: [phone]   RxID:   [id number] PROTONIX 40 MG  TBEC (PANTOPRAZOLE SODIUM) One tablet by mouth daily before breakfast  #30 x 5   Entered and Authorized by:   [name]   Signed by:   [name] on 10/16/2009   Method used:   Faxed to ...       [facility] - [facility] (retail)       [address] [place], [place]  [address]  Ph: [phone]       Fax: [phone]   RxID:   [id number] LISINOPRIL 40 MG TABS (LISINOPRIL) One tablet by mouth daily for blood pressure  #30 x 5   Entered and Authorized by:   [name]   Signed by:   [name] on 10/16/2009   Method used:   Faxed to ...       [facility] - Pharmac (retail)       [address] [place], [place]  [address]       Ph: [phone]       Fax: [phone]   RxID:   [id number] POTASSIUM CHLORIDE CRYS CR 20 MEQ CR-TABS (POTASSIUM CHLORIDE CRYS CR) One tablet by mouth daily for potassium  #30 x 5    Entered and Authorized by:   [name]   Signed by:   [name] on 10/16/2009   Method used:   Faxed to ...       [facility] - Pharmac (retail)       [address] [place], [place]  [address]       Ph: [phone]       Fax: [phone]   RxID:   [id number] CLONIDINE HCL 0.2 MG TABS (CLONIDINE HCL) One tablet by mouth two times a day for blood pressure **  #60 x 5   Entered and Authorized by:   [name]   Signed by:   [name] on 10/16/2009   Method used:   Faxed to ...       [facility] - Pharmac (retail)       [address] [place], [place]  [address]       Ph: [phone]       Fax: [phone]   RxID:   [id number]   Appended Document: Complete Physical Exam     Clinical Lists Changes  Observations: Added new observation of LAB RESULTS: REFUSED RECTAL EXAM (10/17/2009 13:43) Added new observation of KOH PREP: Negative (10/17/2009 13:43)      Laboratory Results    [place] Source: vaginal WBC/hpf: 1-5 Bacteria/hpf: rare Clue cells/hpf: none Yeast/hpf: none [place] KOH: Negative Trichomonas/hpf: none Comments: REFUSED RECTAL EXAM   Appended Document: Complete Physical Exam     Allergies: No Known Drug Allergies   Complete Medication List: 1)  Furosemide 40 Mg Tabs (Furosemide) .... One tablet by mouth daily 2)  Proventil Hfa 108 (90 Base) Mcg/act Aers (Albuterol sulfate) .... 2 puffs q 4-6h as needed 5 refills 3)  Potassium Chloride Crys Cr 20 Meq Cr-tabs (Potassium chloride crys cr) .... One tablet by mouth daily for potassium 4)  Lisinopril 40 Mg Tabs (Lisinopril) .... One tablet by mouth daily for blood pressure 5)  Nasacort Aq 55 Mcg/act Aers (Triamcinolone acetonide(nasal)) .Marland Kitchen.. 1 squirt q nostril once daily 5 refills 6)  Protonix 40 Mg Tbec (Pantoprazole sodium) .... One tablet by mouth daily before breakfast 7)   Entric Coated Aspirin 81mg   .... Once daily 5 refills 8)  Nystatin-triamcinolone 100000-0.1 Unit/gm-% Oint (Nystatin-triamcinolone) .Marland Kitchen.. 1 application topically two times a day for irritation 9)  Clonidine Hcl 0.2 Mg Tabs (Clonidine hcl) .... One tablet by mouth two times a day for blood pressure ** 10)  Bentyl 20 Mg Tabs (Dicyclomine hcl) .... One tablet by mouth three times a day  before meals for stomach 11)  Neurontin 300 Mg Caps (Gabapentin) .... One capsule by mouth nightly for hip pain   Laboratory Results  Date/Time Received: October 17, 2009 3:32 PM   Other Tests  Rapid HIV: negative

## 2010-04-22 NOTE — Assessment & Plan Note (Signed)
Summary: Chest pain   Vital Signs:  Patient profile:   55 year old female Menstrual status:  partial hysterectomy Weight:      239.1 pounds BMI:     41.84 BSA:     2.10 Temp:     98.0 degrees F oral Pulse rate:   72 / minute Pulse rhythm:   regular Resp:     16 per minute BP sitting:   114 / 73  (left arm) Cuff size:   large  Vitals Entered By: [name] (Jul 22, 2009 11:44 AM) CC: hurting in chest area every now and then, it feels like it is a little lump in her chest x February that is getting worse, Hypertension Management, Abdominal Pain Is Patient Diabetic? No Pain Assessment Patient in pain? yes     Location: chest  Intensity: 3 CBG Result 103  Does patient need assistance? Functional Status Self care Ambulation Normal   Primary Care Provider:  Beverley Fiedler MD  CC:  hurting in chest area every now and then, it feels like it is a little lump in her chest x February that is getting worse, Hypertension Management, and Abdominal Pain.  History of Present Illness:  Pt into the office with complaints of mid-chest tenderness. Started back in February 2011 but is increasing. no radiation into the arms Denies any spicy foods No tomato sauces No Tobacco No ETOH  Dyspepsia History:      She has no alarm features of dyspepsia including no history of melena, hematochezia, dysphagia, persistent vomiting, or involuntary weight loss > 5%.  The patient does not have a prior history of documented ulcer disease.  An H-2 blocker medication is not currently being taken.  No previous upper endoscopy has been done.    Hypertension History:      She denies headache, chest pain, and palpitations.  She notes no problems with any antihypertensive medication side effects.  Pt is taking medications as ordered.        Positive major cardiovascular risk factors include diabetes and hypertension.  Negative major cardiovascular risk factors include female age less than 2 years  old, negative family history for ischemic heart disease, and non-tobacco-user status.       Habits & Providers  Alcohol-Tobacco-Diet     Alcohol drinks/day: 0     Tobacco Status: quit     Year Quit: 2004     Passive Smoke Exposure: yes  Exercise-Depression-Behavior     Does Patient Exercise: no     Have you felt down or hopeless? no     Have you felt little pleasure in things? no     Drug Use: no     Seat Belt Use: 100     Sun Exposure: occasionally  Allergies (verified): No Known Drug Allergies  Review of Systems CV:  Complains of chest pain or discomfort. Resp:  Denies cough. GI:  Denies abdominal pain, nausea, and vomiting. GU:  Complains of dysuria and urinary frequency. Neuro:  Complains of headaches.  Physical Exam  General:  alert.  obese Head:  normocephalic.   Chest Wall:  mid-sternal tenderness Lungs:  normal breath sounds.   Heart:  normal rate and regular rhythm.   Neurologic:  alert & oriented X3.   Skin:  color normal.   Psych:  Oriented X3.     Impression & Recommendations:  Problem # 1:  DIABETES MELLITUS, TYPE II (ICD-250.00) diet controlled hgba1c = 6.3 today; continue current management Her updated medication list  for this problem includes:    Lisinopril 40 Mg Tabs (Lisinopril) ..... One tablet by mouth daily for blood pressure  Orders: Capillary Blood Glucose/CBG (82948) Hemoglobin A1C (62952)  Problem # 2:  GERD (ICD-530.81) advised pt to take protonix twice daily for one week then back to once daily booklet given on foods to avoid Her updated medication list for this problem includes:    Protonix 40 Mg Tbec (Pantoprazole sodium) ..... One tablet by mouth daily before breakfast    Bentyl 20 Mg Tabs (Dicyclomine hcl) ..... One tablet by mouth three times a day before meals for stomach  Problem # 3:  HYPERTENSION (ICD-401.9) stable continue current meds Her updated medication list for this problem includes:    Furosemide 40 Mg Tabs  (Furosemide) ..... One tablet by mouth daily    Lisinopril 40 Mg Tabs (Lisinopril) ..... One tablet by mouth daily for blood pressure    Clonidine Hcl 0.1 Mg Tabs (Clonidine hcl) .Marland Kitchen... Take 1 tablet by mouth every 12 hours  Orders: EKG w/ Interpretation (93000)  Complete Medication List: 1)  Furosemide 40 Mg Tabs (Furosemide) .... One tablet by mouth daily 2)  Proventil Hfa 108 (90 Base) Mcg/act Aers (Albuterol sulfate) .... 2 puffs q 4-6h as needed 5 refills 3)  Potassium Chloride Crys Cr 20 Meq Cr-tabs (Potassium chloride crys cr) .... One tablet by mouth daily for potassium 4)  Lisinopril 40 Mg Tabs (Lisinopril) .... One tablet by mouth daily for blood pressure 5)  Nasacort Aq 55 Mcg/act Aers (Triamcinolone acetonide(nasal)) .Marland Kitchen.. 1 squirt q nostril once daily 5 refills 6)  Protonix 40 Mg Tbec (Pantoprazole sodium) .... One tablet by mouth daily before breakfast 7)  Entric Coated Aspirin 81mg   .... Once daily 5 refills 8)  Nystatin-triamcinolone 100000-0.1 Unit/gm-% Oint (Nystatin-triamcinolone) .Marland Kitchen.. 1 application topically two times a day for irritation 9)  Clonidine Hcl 0.1 Mg Tabs (Clonidine hcl) .... Take 1 tablet by mouth every 12 hours 10)  Bentyl 20 Mg Tabs (Dicyclomine hcl) .... One tablet by mouth three times a day before meals for stomach  Hypertension Assessment/Plan:      The patient's hypertensive risk group is category C: Target organ damage and/or diabetes.  Her calculated 10 year risk of coronary heart disease is 8 %.  Today's blood pressure is 114/73.  Her blood pressure goal is < 130/80.  Patient Instructions: 1)  EKG is ok. 2)  Pre-diabetes is still borderline.  No need for medications at this time just continue to monitor 3)  Chest pain - EKG is ok.   4)  Monitor spicy foods, caffiene (soda,coffee, tea), chocholate 5)  Take protonix twice daily for one week then return to once daily 6)  Follow up as needed  Laboratory Results   Blood Tests   Date/Time Received:  Jul 22, 2009 12:57 PM   HGBA1C: 6.3%   (Normal Range: Non-Diabetic - 3-6%   Control Diabetic - 6-8%) CBG Random:: 103mg /dL       EKG  Procedure date:  07/22/2009  Findings:      NSR

## 2010-04-24 ENCOUNTER — Encounter (INDEPENDENT_AMBULATORY_CARE_PROVIDER_SITE_OTHER): Payer: Self-pay | Admitting: Nurse Practitioner

## 2010-04-24 ENCOUNTER — Ambulatory Visit (HOSPITAL_COMMUNITY)
Admission: RE | Admit: 2010-04-24 | Discharge: 2010-04-24 | Disposition: A | Discharge status: Home / Self Care | Payer: No Typology Code available for payment source | Source: Ambulatory Visit | Attending: Nurse Practitioner | Admitting: Nurse Practitioner

## 2010-04-24 ENCOUNTER — Encounter: Payer: Self-pay | Admitting: Nurse Practitioner

## 2010-04-24 ENCOUNTER — Other Ambulatory Visit: Payer: Self-pay | Admitting: Nurse Practitioner

## 2010-04-24 DIAGNOSIS — R51 Headache: Secondary | ICD-10-CM | POA: Insufficient documentation

## 2010-04-24 DIAGNOSIS — M629 Disorder of muscle, unspecified: Secondary | ICD-10-CM | POA: Insufficient documentation

## 2010-04-24 DIAGNOSIS — M542 Cervicalgia: Secondary | ICD-10-CM | POA: Insufficient documentation

## 2010-04-24 DIAGNOSIS — R519 Headache, unspecified: Secondary | ICD-10-CM | POA: Insufficient documentation

## 2010-04-24 DIAGNOSIS — S139XXA Sprain of joints and ligaments of unspecified parts of neck, initial encounter: Secondary | ICD-10-CM

## 2010-04-24 DIAGNOSIS — M2420 Disorder of ligament, unspecified site: Secondary | ICD-10-CM | POA: Insufficient documentation

## 2010-04-24 LAB — CONVERTED CEMR LAB
Blood in Urine, dipstick: NEGATIVE
Ketones, urine, test strip: NEGATIVE
Nitrite: NEGATIVE
Urobilinogen, UA: 1

## 2010-04-25 ENCOUNTER — Telehealth (INDEPENDENT_AMBULATORY_CARE_PROVIDER_SITE_OTHER): Payer: Self-pay | Admitting: Nurse Practitioner

## 2010-04-28 ENCOUNTER — Encounter (INDEPENDENT_AMBULATORY_CARE_PROVIDER_SITE_OTHER): Payer: Self-pay | Admitting: Nurse Practitioner
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
## 2010-04-30 NOTE — Letter (Signed)
Summary: Handout Printed  Printed Handout:  - Cervical Sprain and Strain 

## 2010-05-08 ENCOUNTER — Ambulatory Visit: Payer: No Typology Code available for payment source | Attending: Nurse Practitioner | Admitting: Physical Therapy

## 2010-05-08 DIAGNOSIS — M25519 Pain in unspecified shoulder: Secondary | ICD-10-CM | POA: Insufficient documentation

## 2010-05-08 DIAGNOSIS — IMO0001 Reserved for inherently not codable concepts without codable children: Secondary | ICD-10-CM | POA: Insufficient documentation

## 2010-05-08 DIAGNOSIS — M2560 Stiffness of unspecified joint, not elsewhere classified: Secondary | ICD-10-CM | POA: Insufficient documentation

## 2010-05-08 DIAGNOSIS — M542 Cervicalgia: Secondary | ICD-10-CM | POA: Insufficient documentation
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
## 2010-05-13 ENCOUNTER — Ambulatory Visit: Payer: No Typology Code available for payment source

## 2010-05-14 ENCOUNTER — Encounter (INDEPENDENT_AMBULATORY_CARE_PROVIDER_SITE_OTHER): Payer: Self-pay | Admitting: Nurse Practitioner

## 2010-05-14 ENCOUNTER — Encounter: Payer: Self-pay | Admitting: Nurse Practitioner

## 2010-05-14 DIAGNOSIS — R229 Localized swelling, mass and lump, unspecified: Secondary | ICD-10-CM | POA: Insufficient documentation

## 2010-05-14 LAB — CONVERTED CEMR LAB
Blood Glucose, Fingerstick: 122
Blood in Urine, dipstick: NEGATIVE
Ketones, urine, test strip: NEGATIVE
Nitrite: NEGATIVE
Protein, U semiquant: NEGATIVE
Urobilinogen, UA: 0.2
pH: 6.5

## 2010-05-15 ENCOUNTER — Ambulatory Visit: Payer: No Typology Code available for payment source

## 2010-05-20 ENCOUNTER — Ambulatory Visit: Payer: No Typology Code available for payment source | Admitting: Physical Therapy

## 2010-05-20 NOTE — Letter (Signed)
Summary: *Referral Letter  [facility]  [address] [place], [place] [address]   Phone: [phone]  Fax: [phone]    05/14/2010   Kayliee Atienza [address] [place], [place]  [address]  Phone: [phone]  Tammy [name] was seen and examined in the office. She has full range of motion to the right upper extremity.  There is a palpable enlargement of the biceps but it is nontender.  Brusing also apparent.  She has been diagnosed with a superficial throbosis.  This should resolve over time.  She has been instructed to continue her current treatment regimen. I would advise no direct pressure, lifting or extreme exercise to the right upper arm for at least the next week.   Current Medical Problems: 1)  LOCALIZED SUPERFICIAL SWELLING MASS OR LUMP (ICD-782.2)   Thank you again for agreeing to see our patient; please contact us if you have any further questions or need additional information.  Sincerely,   [name] [facility]

## 2010-05-20 NOTE — Letter (Signed)
Summary: Handout Printed  Printed Handout:  - Thrombophlebitis, Superficial
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
## 2010-05-20 NOTE — Miscellaneous (Signed)
Summary: Rehab Report//INITIAL SUMMARY//FAXED  Rehab Report//INITIAL SUMMARY//FAXED   Imported By: Arta Bruce 05/14/2010 08:38:23  _____________________________________________________________________  External Attachment:    Type:   Image     Comment:   External Document

## 2010-05-22 ENCOUNTER — Ambulatory Visit: Payer: No Typology Code available for payment source | Attending: Nurse Practitioner | Admitting: Physical Therapy

## 2010-05-22 DIAGNOSIS — M25519 Pain in unspecified shoulder: Secondary | ICD-10-CM | POA: Insufficient documentation

## 2010-05-22 DIAGNOSIS — M542 Cervicalgia: Secondary | ICD-10-CM | POA: Insufficient documentation

## 2010-05-22 DIAGNOSIS — M2560 Stiffness of unspecified joint, not elsewhere classified: Secondary | ICD-10-CM | POA: Insufficient documentation

## 2010-05-22 DIAGNOSIS — IMO0001 Reserved for inherently not codable concepts without codable children: Secondary | ICD-10-CM | POA: Insufficient documentation

## 2010-05-27 ENCOUNTER — Ambulatory Visit: Payer: No Typology Code available for payment source | Admitting: Physical Therapy

## 2010-05-28 ENCOUNTER — Emergency Department (HOSPITAL_COMMUNITY): Payer: No Typology Code available for payment source

## 2010-05-28 ENCOUNTER — Emergency Department (HOSPITAL_COMMUNITY)
Admission: EM | Admit: 2010-05-28 | Discharge: 2010-05-29 | Disposition: A | Discharge status: Home / Self Care | Payer: No Typology Code available for payment source | Attending: Emergency Medicine | Admitting: Emergency Medicine

## 2010-05-28 ENCOUNTER — Telehealth (INDEPENDENT_AMBULATORY_CARE_PROVIDER_SITE_OTHER): Payer: Self-pay | Admitting: Nurse Practitioner

## 2010-05-28 DIAGNOSIS — I10 Essential (primary) hypertension: Secondary | ICD-10-CM | POA: Insufficient documentation

## 2010-05-28 DIAGNOSIS — M62838 Other muscle spasm: Secondary | ICD-10-CM | POA: Insufficient documentation

## 2010-05-28 DIAGNOSIS — E119 Type 2 diabetes mellitus without complications: Secondary | ICD-10-CM | POA: Insufficient documentation

## 2010-05-28 DIAGNOSIS — R51 Headache: Secondary | ICD-10-CM | POA: Insufficient documentation

## 2010-05-29 ENCOUNTER — Encounter (HOSPITAL_COMMUNITY): Payer: Self-pay

## 2010-05-29 ENCOUNTER — Ambulatory Visit: Payer: No Typology Code available for payment source | Admitting: Physical Therapy

## 2010-06-03 ENCOUNTER — Ambulatory Visit: Payer: No Typology Code available for payment source | Admitting: Physical Therapy

## 2010-06-05 ENCOUNTER — Encounter (INDEPENDENT_AMBULATORY_CARE_PROVIDER_SITE_OTHER): Payer: Self-pay | Admitting: Nurse Practitioner

## 2010-06-05 ENCOUNTER — Ambulatory Visit: Payer: No Typology Code available for payment source | Admitting: Physical Therapy

## 2010-06-05 ENCOUNTER — Encounter: Payer: Self-pay | Admitting: Nurse Practitioner

## 2010-06-05 DIAGNOSIS — M25519 Pain in unspecified shoulder: Secondary | ICD-10-CM | POA: Insufficient documentation

## 2010-06-05 LAB — CONVERTED CEMR LAB: Hgb A1c MFr Bld: 6.1 %

## 2010-06-10 ENCOUNTER — Ambulatory Visit: Payer: No Typology Code available for payment source
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
## 2010-06-10 NOTE — Progress Notes (Signed)
Summary: Knot in neck  Phone Note Call from Patient   Caller: Patient Reason for Call: Talk to Nurse Summary of Call: Patient left message on voicemail stating she has a knot on the back of her neck (left side). Patient would like a call back. Initial call taken by: Sharen Heck RN,  May 28, 2010 6:18 PM  Follow-up for Phone Call        No answer.  Dutch Quint RN  June 02, 2010 6:11 PM  Left message with female at home for pt. to return call.  Dutch Quint RN  June 03, 2010 3:55 PM  No answer. Pt. has OV 06/05/10.  Dutch Quint RN  June 04, 2010 2:16 PM

## 2010-06-10 NOTE — Miscellaneous (Signed)
Summary: Rehab Report//PROGRESS NOTE//FAXED  Rehab Report//PROGRESS NOTE//FAXED   Imported By: Arta Bruce 06/05/2010 15:47:42  _____________________________________________________________________  External Attachment:    Type:   Image     Comment:   External Document

## 2010-06-12 ENCOUNTER — Encounter (INDEPENDENT_AMBULATORY_CARE_PROVIDER_SITE_OTHER): Payer: Self-pay | Admitting: Nurse Practitioner

## 2010-06-13 ENCOUNTER — Ambulatory Visit: Payer: No Typology Code available for payment source | Admitting: Physical Therapy

## 2010-06-17 ENCOUNTER — Ambulatory Visit: Payer: No Typology Code available for payment source | Admitting: Physical Therapy

## 2010-06-19 ENCOUNTER — Ambulatory Visit: Payer: No Typology Code available for payment source | Admitting: Physical Therapy

## 2010-06-19 NOTE — Letter (Signed)
Summary: MEDICAL MODALITIES//FAXED  MEDICAL MODALITIES//FAXED   Imported By: Arta Bruce 06/12/2010 10:54:16  _____________________________________________________________________  External Attachment:    Type:   Image     Comment:   External Document

## 2010-06-27 LAB — POCT I-STAT, CHEM 8
Calcium, Ion: 1.1 mmol/L — ABNORMAL LOW (ref 1.12–1.32)
Glucose, Bld: 116 mg/dL — ABNORMAL HIGH (ref 70–99)
HCT: 40 % (ref 36.0–46.0)
Hemoglobin: 13.6 g/dL (ref 12.0–15.0)
TCO2: 24 mmol/L (ref 0–100)

## 2010-06-27 LAB — CBC
MCHC: 33.5 g/dL (ref 30.0–36.0)
RBC: 4.4 MIL/uL (ref 3.87–5.11)
RDW: 15.7 % — ABNORMAL HIGH (ref 11.5–15.5)

## 2010-06-27 LAB — CK TOTAL AND CKMB (NOT AT ARMC)
Relative Index: INVALID (ref 0.0–2.5)
Total CK: 78 U/L (ref 7–177)

## 2010-06-27 LAB — GLUCOSE, CAPILLARY: Glucose-Capillary: 114 mg/dL — ABNORMAL HIGH (ref 70–99)

## 2010-06-27 LAB — URINALYSIS, ROUTINE W REFLEX MICROSCOPIC
Bilirubin Urine: NEGATIVE
Nitrite: NEGATIVE
Specific Gravity, Urine: 1.017 (ref 1.005–1.030)
Urobilinogen, UA: 0.2 mg/dL (ref 0.0–1.0)

## 2010-06-27 LAB — POCT CARDIAC MARKERS
CKMB, poc: <1 ng/mL — ABNORMAL LOW (ref 1.0–8.0)
Myoglobin, poc: 38.9 ng/mL (ref 12–200)

## 2010-06-27 LAB — DIFFERENTIAL
Basophils Absolute: 0 10*3/uL (ref 0.0–0.1)
Basophils Relative: 1 % (ref 0–1)
Eosinophils Relative: 2 % (ref 0–5)
Monocytes Absolute: 0.3 10*3/uL (ref 0.1–1.0)
Neutro Abs: 2.4 10*3/uL (ref 1.7–7.7)

## 2010-07-07 LAB — DIFFERENTIAL
Basophils Relative: 1 % (ref 0–1)
Lymphs Abs: 2.8 10*3/uL (ref 0.7–4.0)
Monocytes Relative: 5 % (ref 3–12)
Neutro Abs: 3.1 10*3/uL (ref 1.7–7.7)
Neutrophils Relative %: 49 % (ref 43–77)

## 2010-07-07 LAB — POCT I-STAT, CHEM 8
Chloride: 107 mEq/L (ref 96–112)
Glucose, Bld: 106 mg/dL — ABNORMAL HIGH (ref 70–99)
HCT: 41 % (ref 36.0–46.0)
Potassium: 3.8 mEq/L (ref 3.5–5.1)
Sodium: 144 mEq/L (ref 135–145)

## 2010-07-07 LAB — CBC
MCHC: 33.2 g/dL (ref 30.0–36.0)
RBC: 4.34 MIL/uL (ref 3.87–5.11)
WBC: 6.3 10*3/uL (ref 4.0–10.5)

## 2010-08-08 NOTE — Cardiovascular Report (Signed)
   NAME:  Tammy [name], Tammy [name]                        ACCOUNT NO.:  [id number]   MEDICAL RECORD NO.:  [id number]                   PATIENT TYPE:  OUT   LOCATION:  CATH                                 FACILITY:  [facility]   PHYSICIAN:  Rollene Rotunda, M.D.                DATE OF BIRTH:  1955/09/15   DATE OF PROCEDURE:  08/07/2002  DATE OF DISCHARGE:                              CARDIAC CATHETERIZATION   PRIMARY CARE PHYSICIAN:  Turkey R. Rankins, M.D., [facility].   PROCEDURE:  Left heart catheterization/coronary arteriography   INDICATIONS:  Evaluate patient with chest pain.   DESCRIPTION OF PROCEDURE:  The left heart catheterization performed via the  right femoral artery.  The artery was cannulated with a Smart needle.  The  sheath was inserted via the modified Seldinger technique.  Preformed Judkins  and a pigtail catheter were utilized.  The patient tolerated the procedure  well and left the lab in stable condition.   HEMODYNAMICS:  1. LV:  163/15.  2. AO:  159/87.   CORONARIES:  1. The left main is normal.  2. The LAD is normal.  There are two small diagonals.  3. The circumflex has ostial 25% stenosis.  The vessel is essentially a     large mid-obtuse marginal.  4. The right coronary artery is a dominant vessel.  There is a long proximal     30% stenosis.  There was diffuse mild spasm.  After intravenous     nitroglycerin, the vessel was seen to be at least moderate in size.   LEFT VENTRICULOGRAM:  The left ventriculogram was obtained in the RAO  projection.  EF 65% with normal wall motion.   CONCLUSION:  1. Nonobstructive coronary disease.  2. Normal left ventricular function.    PLAN:  The patient will have medical management.  We will add Norvasc for  the possibility of coronary spasm, although her pain is somewhat atypical  for this.  She needs to have primary risk reduction and will follow with Dr.  Barbaraann Barthel for management of her hypertension and diabetes.                                                Rollene Rotunda, M.D.    JH/MEDQ  D:  08/07/2002  T:  08/07/2002  Job:  [id number]   cc:   Fanny Dance. Rankins, M.D.  [address]  [place]  [place] [address]  Fax: [phone]

## 2010-08-08 NOTE — Procedures (Signed)
[name], Tammy              ACCOUNT NO.:  [id number]   MEDICAL RECORD NO.:  [id number]          PATIENT TYPE:  OUT   LOCATION:  SLEEP CENTER                 FACILITY[facility]  [facility]   PHYSICIAN:  Clinton D. Maple Hudson, M.D. DATE OF BIRTH:  1955/11/20   DATE OF STUDY:  04/23/2004                              NOCTURNAL POLYSOMNOGRAM   REFERRING PHYSICIAN:  Beverley Fiedler, MD   INDICATIONS FOR STUDY:  Hypersomnia with sleep apnea. Epworth sleepiness  score 14/24, BMI 39, weight 243 pounds.   SLEEP ARCHITECTURE:  Short total sleep time of 272 minutes with sleep  efficiency of 62%. Stage I was 12%, stage II was 67%, stages III and IV were  12%, REM was 9% of total sleep time. Latency to sleep onset 9 minutes.  Latency to REM 348 minutes. Awake after sleep onset 160 minutes. Arousal  index 18.   RESPIRATORY DATA:  Split study protocol. RDI 28.6 obstructive events per  hour indicating moderate obstructive sleep apnea/hypopnea syndrome before  CPAP. This included 14 obstructive apneas and 44 hypopneas before CPAP. The  events were not positional. REM RDI 5 per hour. CPAP was titrated to 15 CWP,  RDI 1.7 per hour using a small Comfort Gel mask with heated humidifier. The  technician also added a chin strap initially, but this is not likely to be  necessary at home.   OXYGEN DATA:  Moderate to loud snoring with oxygen desaturation to a nadir  of 78% before CPAP. After CPAP control, saturation held 95% to 98% on room  air.   CARDIAC DATA:  Normal sinus rhythm.   MOVEMENT/PARASOMNIA:  Occasional leg jerk with insignificant effect on  sleep. Bathroom times three.   IMPRESSION/RECOMMENDATIONS:  1.  Moderate obstructive sleep apnea/hypopnea syndrome, respiratory      disturbance index 28.6 per hour with moderate to loud snoring and oxygen      desaturation to 78%.  2.  Successful CPAP titration to 15 CWP, RDI 1.7 per hour using a small      Comfort Gel mask and heated humidifier.      CDY/MEDQ  D:  05/04/2004 09:52:24  T:  05/04/2004 18:56:51  Job:  [id number]

## 2010-08-08 NOTE — H&P (Signed)
NAME:  Tammy [name], Tammy [name]                        ACCOUNT NO.:  [id number]   MEDICAL RECORD NO.:  [id number]                   PATIENT TYPE:  INP   LOCATION:  0164                                 FACILITY:  [facility]   PHYSICIAN:  Jesse Sans. Wall, M.D.                DATE OF BIRTH:  [birth year]   DATE OF ADMISSION:  08/03/2002  DATE OF DISCHARGE:                                HISTORY & PHYSICAL   HISTORY OF PRESENTING ILLNESS:  I was asked by the emergency room physician  Dr. Margretta Ditty at [facility] to evaluate the patient, a 55 year old  black female with substernal chest pressure since about 8:30 this evening.  She came to the emergency room around 10 o'clock.  Her discomfort was  relieved partially with sublingual nitroglycerin, heparin and aspirin.  Her  EKG was normal. Chest x-ray shows no acute disease.  Initial enzymes x1 are  negative.   CARDIAC RISK FACTORS:  1. Hypertension.  2. Obesity.  3. Sedentary lifestyle.  4. Positive family history.   ALLERGIES:  No known drug allergies.   MEDICATIONS AT HOME:  1. Lasix 40 mg daily.  2. Protonix 40 mg daily.  3. Potassium 20 mEq daily.  4. Lisinopril 5 mg daily.   PAST MEDICAL HISTORY:  She has no history of diabetes, does not smoke, and  does not know her lipid status.  She has a history of gastroesophageal  reflux disease.   PAST SURGICAL HISTORY:  She has no previous surgeries.   SOCIAL HISTORY:  She lives in [place].  Her family is with her tonight.   FAMILY HISTORY:  Positive for brothers who have had coronary disease.   PHYSICAL EXAMINATION:  GENERAL:  She is complaining of chest discomfort,  though she is partially lethargic, I guess from sedation.  VITAL SIGNS:  Initial blood pressure was 172/99, pulse 86 and regular,  respiratory rate 28, O2 saturation 100%.  SKIN:  Warm and dry.  HEENT:  Exam is unremarkable.  NECK:  Carotid upstrokes are equal bilaterally without bruits.  There is no  thyromegaly.  LUNGS:  Clear to auscultation.  HEART:  Reveals a soft S1, S2 without murmurs, rubs or gallops.  S2 does  split.  ABDOMEN:  Very protuberant and makes organomegaly difficult to assess.  There is no obvious tenderness.  Bowel sounds are present.  EXTREMITIES:  Reveal good pulses, dorsalis pedis, posterior tibial.  There  is no edema.  There is no sign of calf tenderness and no sign of any DVT.   LABORATORY DATA:  EKG is essentially normal.  Her other blood work is  normal.   ASSESSMENT:  1. Chest pain rule out unstable angina versus non-Q wave myocardial     infarction.  2. Hypertension.  3. Obesity.  4. Positive family history.   PLAN:  1. Check serial enzymes.  2. TSH.  3. EKG in the morning.  4. Fasting lipid panel and hemoglobin A1c.  5. Treat with heparin, nitroglycerin, aspirin and beta-blocker.  6. May need catheterization.  7. Encouraged weight loss and increased activity.                                               Thomas C. Wall, M.D.    TCW/MEDQ  D:  08/03/2002  T:  08/04/2002  Job:  [id number]   cc:   Fanny Dance. Rankins, M.D.  [address]  [place]  [place] [address]  Fax: [phone]   Jesse Sans. Wall, M.D.
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
## 2010-08-08 NOTE — Discharge Summary (Signed)
NAME:  Tammy [name], Tammy [name] NO.:  [id number]   MEDICAL RECORD NO.:  [id number]                   PATIENT TYPE:  INP   LOCATION:                                       FACILITY:  [facility]   PHYSICIAN:  Rollene Rotunda, M.D.                DATE OF BIRTH:  [birth year]   DATE OF ADMISSION:  08/04/2002  DATE OF DISCHARGE:  08/08/2002                           DISCHARGE SUMMARY - REFERRING   SUMMARY OF HISTORY:  The patient is a 55 year old black female who presented  to the emergency room at [facility] for evaluation of substernal  chest discomfort.  She came to the emergency room approximately 10 p.m.  because of prolonged chest discomfort since approximately 8:30 p.m.  Her  discomfort was partially relieved with sublingual nitroglycerin, heparin and  aspirin.  Initial EKG and enzymes were negative.   PAST MEDICAL HISTORY:  1. Hypertension.  2. Obesity.  3. Sedentary lifestyle.  4. Early family history.   LABORATORY DATA:  TSH on admission was 2.894.  Fasting lipids showed a total  cholesterol of 100, triglycerides of 85, HDL of 38, LDL of 45.  CK's and  Troponin's times four were negative for a myocardial infarction.  Hemoglobin  A1c was slightly elevated at 6.8.  Admission sodium 139, potassium 3.7, BUN  12, creatinine 0.8, glucose 101 and normal LFTs.  Subsequent chemistries  were essentially unremarkable except for occasional elevated glucose in the  120's.  Admission hemoglobin and hematocrit were 12.6 and 37.0 with normal  indices, platelets 358,000, WBC 7.2.  Subsequent hematologies were  unremarkable.  PT was 12.7 and PTT was 27.0.   Chest x-ray did not show any active disease.  Cardiac size is accentuated by  technique and patient body habitus.   EKG's showed normal sinus rhythm, left axis deviation and early R wave  progression.   HOSPITAL COURSE:  The patient was admitted to [facility] to rule  out a myocardial infarction.   Overnight, she continued to have chest  discomfort with some mild shortness of breath.  However, enzymes, Troponin's  and EKG's were negative for myocardial infarction.  It was noted that her  hemoglobin A1c was slightly elevated.  It was felt with her multiple risk  factors that she should undergo cardiac catheterization and this was  scheduled for Monday.  Dr. Tenny Craw noted that she complained of several  different types of chest discomfort.  Case management also participated in  her care.  Over the weekend she had continued mild episodes of chest  discomfort with normal EKG's.  Lorre Munroe, PA-C discussed diet and  weight loss with the patient in regards to her hyperglycemia and elevated  hemoglobin A1c.  It was felt by Dr. Antoine Poche that she should begin a strict  ADA diet and hopefully she will not need to be placed on medications for her  diabetes.  On Aug 07, 2002 she underwent cardiac catheterization by Dr.  Antoine Poche.  According to his note, she had an ostial 25% circumflex lesion  and a 30% proximal RCA lesion with diffuse mild spasm.  Her ejection  fraction was 65% without wall motion abnormalities.  Dr. Antoine Poche felt that  she had non-obstructive coronary artery disease and normal left ventricular  function and that continued medical management and risk factor modification  should be pursued. On May 18 she had not had any further problems.  The  catheterization site did show some slight ecchymosis.  She was ambulating in  the halls without difficulty and after review with Dr. Antoine Poche, it was felt  that she could be discharged home.  Prior to discharge we will have diabetes  management arranged, dietary teaching and outpatient meetings.    DISCHARGE DIAGNOSES:  1. Non-cardiac chest discomfort.  2. Non-obstructive coronary artery disease.  3. New diagnosis of adult onset diabetes mellitus, anticipate diet control.  4. History as previously.   DISPOSITION:  She was discharged  home.   DISCHARGE MEDICATIONS:  1. Lasix 40 mg daily.  2. Protonix 40 mg daily.  3. K-Dur 20 mEq daily.  4. Lisinopril 5 mg daily.  5. Enteric coated aspirin 325 mg daily.   ACTIVITY:  She was advised no lifting, driving, sexual activity or heavy  exertion for two days.   DIET:  Maintain low salt, low fat, low cholesterol, ADA diet.   SPECIAL INSTRUCTIONS:  1. If she notes problems with her catheterization site she is asked to call     the office.  2. She was asked to consider an exercise weight loss program.  Her husband     states that she does not wish to follow-up with [facility] and that he     will establish her with a primary care physician.  If he does not do this     in the next one to two weeks, she was asked to follow-up with [facility]     for further diabetic management and aggressive cardiac risk factor     modifications.  At the time of follow-up with her permanent  physician or     [facility], consideration should be given to placing the patient on a     statin with non-obstructive coronary artery disease and low HDL.     Joellyn Rued, P.A. [facility]                    Rollene Rotunda, M.D.    EW/MEDQ  D:  08/08/2002  T:  08/08/2002  Job:  [id number]   cc:   [facility]

## 2010-08-08 NOTE — Op Note (Signed)
Tammy [name], BRACH              ACCOUNT NO.:  [id number]   MEDICAL RECORD NO.:  [id number]          PATIENT TYPE:  INP   LOCATION:  1319                         FACILITY:  [facility]   PHYSICIAN:  Burnard Bunting, M.D.    DATE OF BIRTH:  1955/09/02   DATE OF PROCEDURE:  09/24/2004  DATE OF DISCHARGE:                                 OPERATIVE REPORT   PREOPERATIVE DIAGNOSIS:  Right foot foreign body, deep.   POSTOPERATIVE DIAGNOSIS:  Right foot foreign body, deep.   PROCEDURE:  Removal of right foot foreign body with debridement.   ANESTHESIA:  General endotracheal.   ESTIMATED BLOOD LOSS:  5 cc.   DRAINS:  Iodoform gauze 1/4 inch x1.   CULTURES:  One.   PROCEDURE IN DETAIL:  Patient was brought to the operating room were general  endotracheal anesthesia was induced.  Preoperative antibiotics were  administered.  The right foot was prepped with DuraPrep solution and draped  in a sterile manner.  Under fluoroscopic guidance, the location of the  foreign body was localized in two planes.  After applying an ankle esmarch,  the incision was made on the medial border over the MTP joint.  Skin and  subcutaneous tissue was sharply divided, including muscle and fascia.  The  tip of the needle was identified.  Necrotic and potentially infected-  appearing tissue was debrided.  Cultures were obtained.  The foreign body  was removed.  Confirmation was made under fluoroscopy.  The incision was  thoroughly irrigated and closed using two 3-0 nylon sutures over 1/4 inch  iodoform gauze. Bulky dressing well-appearing applied.  The patient  tolerated the procedure well without any complications.       GSD/MEDQ  D:  09/24/2004  T:  09/24/2004  Job:  [id number]

## 2010-08-14 ENCOUNTER — Inpatient Hospital Stay (INDEPENDENT_AMBULATORY_CARE_PROVIDER_SITE_OTHER)
Admission: RE | Admit: 2010-08-14 | Discharge: 2010-08-14 | Disposition: A | Discharge status: Home / Self Care | Payer: Self-pay | Source: Ambulatory Visit | Attending: Emergency Medicine | Admitting: Emergency Medicine

## 2010-08-14 DIAGNOSIS — S61209A Unspecified open wound of unspecified finger without damage to nail, initial encounter: Secondary | ICD-10-CM

## 2010-11-24 ENCOUNTER — Emergency Department (HOSPITAL_COMMUNITY): Payer: Medicaid Other

## 2010-11-24 ENCOUNTER — Inpatient Hospital Stay (HOSPITAL_COMMUNITY)
Admission: EM | Admit: 2010-11-24 | Discharge: 2010-11-26 | DRG: 312 | Disposition: A | Discharge status: Home / Self Care | Payer: Medicaid Other | Attending: Internal Medicine | Admitting: Internal Medicine

## 2010-11-24 DIAGNOSIS — T7492XA Unspecified child maltreatment, confirmed, initial encounter: Secondary | ICD-10-CM | POA: Diagnosis present

## 2010-11-24 DIAGNOSIS — R55 Syncope and collapse: Principal | ICD-10-CM | POA: Diagnosis present

## 2010-11-24 DIAGNOSIS — S0083XA Contusion of other part of head, initial encounter: Secondary | ICD-10-CM | POA: Diagnosis present

## 2010-11-24 DIAGNOSIS — IMO0002 Reserved for concepts with insufficient information to code with codable children: Secondary | ICD-10-CM | POA: Diagnosis present

## 2010-11-24 DIAGNOSIS — E876 Hypokalemia: Secondary | ICD-10-CM | POA: Diagnosis present

## 2010-11-24 DIAGNOSIS — T7411XA Adult physical abuse, confirmed, initial encounter: Secondary | ICD-10-CM | POA: Diagnosis present

## 2010-11-24 DIAGNOSIS — I2510 Atherosclerotic heart disease of native coronary artery without angina pectoris: Secondary | ICD-10-CM | POA: Diagnosis present

## 2010-11-24 DIAGNOSIS — I10 Essential (primary) hypertension: Secondary | ICD-10-CM | POA: Diagnosis present

## 2010-11-24 DIAGNOSIS — S0003XA Contusion of scalp, initial encounter: Secondary | ICD-10-CM | POA: Diagnosis present

## 2010-11-24 DIAGNOSIS — Y92009 Unspecified place in unspecified non-institutional (private) residence as the place of occurrence of the external cause: Secondary | ICD-10-CM

## 2010-11-24 DIAGNOSIS — T7491XA Unspecified adult maltreatment, confirmed, initial encounter: Secondary | ICD-10-CM | POA: Diagnosis present

## 2010-11-24 DIAGNOSIS — E669 Obesity, unspecified: Secondary | ICD-10-CM | POA: Diagnosis present

## 2010-11-24 LAB — CBC
HCT: 34.2 % — ABNORMAL LOW (ref 36.0–46.0)
Hemoglobin: 11.4 g/dL — ABNORMAL LOW (ref 12.0–15.0)
MCH: 29.1 pg (ref 26.0–34.0)
MCHC: 33.3 g/dL (ref 30.0–36.0)
MCV: 87.2 fL (ref 78.0–100.0)
Platelets: 309 10*3/uL (ref 150–400)
RBC: 3.92 MIL/uL (ref 3.87–5.11)
RDW: 14.5 % (ref 11.5–15.5)
WBC: 8.2 10*3/uL (ref 4.0–10.5)

## 2010-11-24 LAB — BASIC METABOLIC PANEL
BUN: 16 mg/dL (ref 6–23)
CO2: 24 mEq/L (ref 19–32)
Calcium: 8.8 mg/dL (ref 8.4–10.5)
Chloride: 109 mEq/L (ref 96–112)
Creatinine, Ser: 0.58 mg/dL (ref 0.50–1.10)
GFR calc Af Amer: >60 mL/min (ref >60)
GFR calc non Af Amer: >60 mL/min (ref >60)
Glucose, Bld: 153 mg/dL — ABNORMAL HIGH (ref 70–99)
Potassium: 3.2 mEq/L — ABNORMAL LOW (ref 3.5–5.1)
Sodium: 142 mEq/L (ref 135–145)

## 2010-11-24 LAB — DIFFERENTIAL
Basophils Absolute: 0 10*3/uL (ref 0.0–0.1)
Basophils Relative: 0 % (ref 0–1)
Eosinophils Absolute: 0 10*3/uL (ref 0.0–0.7)
Eosinophils Relative: 0 % (ref 0–5)
Lymphocytes Relative: 22 % (ref 12–46)
Lymphs Abs: 1.8 10*3/uL (ref 0.7–4.0)
Monocytes Absolute: 0.3 10*3/uL (ref 0.1–1.0)
Monocytes Relative: 4 % (ref 3–12)
Neutro Abs: 6 10*3/uL (ref 1.7–7.7)
Neutrophils Relative %: 74 % (ref 43–77)

## 2010-11-24 LAB — COMPREHENSIVE METABOLIC PANEL
ALT: 19 U/L (ref 0–35)
Alkaline Phosphatase: 52 U/L (ref 39–117)
CO2: 22 mEq/L (ref 19–32)
Glucose, Bld: 93 mg/dL (ref 70–99)
Potassium: 4 mEq/L (ref 3.5–5.1)
Sodium: 143 mEq/L (ref 135–145)
Total Protein: 6.7 g/dL (ref 6.0–8.3)

## 2010-11-24 LAB — POCT I-STAT TROPONIN I: Troponin i, poc: 0 ng/mL (ref 0.00–0.08)

## 2010-11-24 LAB — RAPID URINE DRUG SCREEN, HOSP PERFORMED
Amphetamines: NOT DETECTED
Barbiturates: NOT DETECTED
Benzodiazepines: NOT DETECTED
Cocaine: NOT DETECTED
Tetrahydrocannabinol: NOT DETECTED
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
## 2010-11-25 LAB — COMPREHENSIVE METABOLIC PANEL
ALT: 18 U/L (ref 0–35)
AST: 14 U/L (ref 0–37)
Albumin: 2.9 g/dL — ABNORMAL LOW (ref 3.5–5.2)
Alkaline Phosphatase: 47 U/L (ref 39–117)
Glucose, Bld: 104 mg/dL — ABNORMAL HIGH (ref 70–99)
Potassium: 3.3 mEq/L — ABNORMAL LOW (ref 3.5–5.1)
Sodium: 140 mEq/L (ref 135–145)
Total Protein: 6.1 g/dL (ref 6.0–8.3)

## 2010-11-25 LAB — MRSA PCR SCREENING: MRSA by PCR: NEGATIVE

## 2010-11-25 LAB — CARDIAC PANEL(CRET KIN+CKTOT+MB+TROPI)
CK, MB: 2.3 ng/mL (ref 0.3–4.0)
Relative Index: 1.9 (ref 0.0–2.5)

## 2010-11-26 LAB — BASIC METABOLIC PANEL
Chloride: 106 mEq/L (ref 96–112)
GFR calc Af Amer: >60 mL/min (ref >60)
Potassium: 3.6 mEq/L (ref 3.5–5.1)
Sodium: 140 mEq/L (ref 135–145)
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
## 2010-12-04 ENCOUNTER — Emergency Department (HOSPITAL_COMMUNITY)
Admission: EM | Admit: 2010-12-04 | Discharge: 2010-12-04 | Disposition: A | Discharge status: Home / Self Care | Payer: Medicaid Other | Attending: Emergency Medicine | Admitting: Emergency Medicine

## 2010-12-04 DIAGNOSIS — Z4802 Encounter for removal of sutures: Secondary | ICD-10-CM | POA: Insufficient documentation

## 2010-12-26 LAB — RAPID STREP SCREEN (MED CTR MEBANE ONLY): Streptococcus, Group A Screen (Direct): NEGATIVE

## 2011-03-24 HISTORY — PX: CARDIAC CATHETERIZATION: SHX172

## 2011-04-17 ENCOUNTER — Encounter (HOSPITAL_COMMUNITY): Payer: Self-pay | Admitting: *Deleted

## 2011-04-17 ENCOUNTER — Emergency Department (HOSPITAL_COMMUNITY)
Admission: EM | Admit: 2011-04-17 | Discharge: 2011-04-17 | Disposition: A | Discharge status: Home / Self Care | Payer: Self-pay | Source: Home / Self Care | Attending: Emergency Medicine | Admitting: Emergency Medicine

## 2011-04-17 DIAGNOSIS — Z4802 Encounter for removal of sutures: Secondary | ICD-10-CM

## 2011-04-17 HISTORY — DX: Essential (primary) hypertension: I10

## 2011-04-17 NOTE — ED Provider Notes (Signed)
History     CSN: [id number]  Arrival date & time 04/17/11  4098   First MD Initiated Contact with Patient 04/17/11 1810      Chief Complaint  Patient presents with  . Suture / Staple Removal    (Consider location/radiation/quality/duration/timing/severity/associated sxs/prior treatment) HPI Comments: Sustained 8 cm scalp laceration on 9/3. Lac repaired with running prolene stitch but also with staples. Staples removed on 9/13. Patient states running stitch and staples were removed however, states that a "deep stitch" was still left in place. Per procedure note, 4-0 vicryl stitch was placed to ligate off a bleeding vessel. Patient today has no complaints. States that a "scab" is coming off from the wound. No fevers, redness, tenderness.    Patient is a 56 y.o. female presenting with suture removal. The history is provided by the patient.  Suture / Staple Removal  The sutures were placed more than 14 days ago. Treatments since wound repair include antibiotic ointment use. There has been no drainage from the wound. There is no redness present. There is no swelling present. The pain has no pain.    Past Medical History  Diagnosis Date  . Hypertension   . Asthma     History reviewed. No pertinent past surgical history.  History reviewed. No pertinent family history.  History  Substance Use Topics  . Smoking status: Not on file  . Smokeless tobacco: Not on file  . Alcohol Use: No    OB History    Grav Para Term Preterm Abortions TAB SAB Ect Mult Living                  Review of Systems  Constitutional: Negative for fever.  Neurological: Negative for headaches.    Allergies  Review of patient's allergies indicates no known allergies.  Home Medications   Current Outpatient Rx  Name Route Sig Dispense Refill  . ALBUTEROL SULFATE HFA 108 (90 BASE) MCG/ACT IN AERS Inhalation Inhale 2 puffs into the lungs every 6 (six) hours as needed.    . ASPIRIN 81 MG PO TABS Oral  Take 160 mg by mouth daily.    Marland Kitchen CLONIDINE HCL 0.2 MG PO TABS Oral Take 0.2 mg by mouth 2 (two) times daily.    Marland Kitchen GABAPENTIN 100 MG PO CAPS Oral Take 100 mg by mouth 3 (three) times daily.    Marland Kitchen LISINOPRIL 40 MG PO TABS Oral Take 40 mg by mouth daily.    Marland Kitchen PANTOPRAZOLE SODIUM 40 MG PO TBEC Oral Take 40 mg by mouth daily.    Marland Kitchen POTASSIUM CHLORIDE 20 MEQ/15ML (10%) PO LIQD Oral Take 20 mEq by mouth daily.      BP 158/90  Pulse 80  Temp(Src) 98.2 F (36.8 C) (Oral)  Resp 20  SpO2 97%  Physical Exam  Nursing note and vitals reviewed. Constitutional: She is oriented to person, place, and time. She appears well-developed and well-nourished. No distress.  HENT:  Head:         healed 8 cm laceration on scalp. Scab with prolene stitch.   Eyes: Conjunctivae and EOM are normal.  Neck: Normal range of motion.  Cardiovascular: Regular rhythm.   Pulmonary/Chest: Effort normal.  Abdominal: She exhibits no distension.  Musculoskeletal: Normal range of motion.  Neurological: She is alert and oriented to person, place, and time.  Skin: Skin is warm and dry.  Psychiatric: She has a normal mood and affect. Her behavior is normal. Judgment and thought content normal.  ED Course  Procedures (including critical care time)  Labs Reviewed - No data to display No results found.   1. Visit for suture removal    Pulled scab off, removed stitch with it. No signs of infection. Will have pt continue bacitracin prn.    MDM  Previous charts reviewed. As noted in hpi  Luiz Blare, MD 04/17/11 1925

## 2011-04-17 NOTE — ED Notes (Signed)
Here   For  Suture  Removal -   One  Suture  In place      Pt  States  It has  Been in  Since  Sept        Suture  Is  On  Her  Scalp     -  She  Verbalizes  No  Other  Symptoms

## 2011-05-09 ENCOUNTER — Encounter (HOSPITAL_COMMUNITY): Payer: Self-pay | Admitting: *Deleted

## 2011-05-09 ENCOUNTER — Inpatient Hospital Stay (HOSPITAL_COMMUNITY)
Admission: EM | Admit: 2011-05-09 | Discharge: 2011-05-12 | DRG: 948 | Disposition: A | Discharge status: Home / Self Care | Payer: Self-pay | Attending: Internal Medicine | Admitting: Internal Medicine

## 2011-05-09 ENCOUNTER — Emergency Department (HOSPITAL_COMMUNITY): Payer: Self-pay

## 2011-05-09 ENCOUNTER — Inpatient Hospital Stay (HOSPITAL_COMMUNITY): Payer: Self-pay

## 2011-05-09 DIAGNOSIS — M6740 Ganglion, unspecified site: Secondary | ICD-10-CM

## 2011-05-09 DIAGNOSIS — R229 Localized swelling, mass and lump, unspecified: Secondary | ICD-10-CM

## 2011-05-09 DIAGNOSIS — K219 Gastro-esophageal reflux disease without esophagitis: Secondary | ICD-10-CM

## 2011-05-09 DIAGNOSIS — R1084 Generalized abdominal pain: Secondary | ICD-10-CM

## 2011-05-09 DIAGNOSIS — G4733 Obstructive sleep apnea (adult) (pediatric): Secondary | ICD-10-CM

## 2011-05-09 DIAGNOSIS — N760 Acute vaginitis: Secondary | ICD-10-CM

## 2011-05-09 DIAGNOSIS — F3289 Other specified depressive episodes: Secondary | ICD-10-CM

## 2011-05-09 DIAGNOSIS — M5430 Sciatica, unspecified side: Secondary | ICD-10-CM

## 2011-05-09 DIAGNOSIS — I498 Other specified cardiac arrhythmias: Secondary | ICD-10-CM

## 2011-05-09 DIAGNOSIS — E119 Type 2 diabetes mellitus without complications: Secondary | ICD-10-CM

## 2011-05-09 DIAGNOSIS — M25522 Pain in left elbow: Secondary | ICD-10-CM

## 2011-05-09 DIAGNOSIS — M25552 Pain in left hip: Secondary | ICD-10-CM

## 2011-05-09 DIAGNOSIS — IMO0002 Reserved for concepts with insufficient information to code with codable children: Secondary | ICD-10-CM | POA: Diagnosis present

## 2011-05-09 DIAGNOSIS — M25559 Pain in unspecified hip: Secondary | ICD-10-CM | POA: Diagnosis present

## 2011-05-09 DIAGNOSIS — R51 Headache: Secondary | ICD-10-CM

## 2011-05-09 DIAGNOSIS — M2550 Pain in unspecified joint: Secondary | ICD-10-CM

## 2011-05-09 DIAGNOSIS — I252 Old myocardial infarction: Secondary | ICD-10-CM

## 2011-05-09 DIAGNOSIS — S139XXA Sprain of joints and ligaments of unspecified parts of neck, initial encounter: Secondary | ICD-10-CM

## 2011-05-09 DIAGNOSIS — R55 Syncope and collapse: Secondary | ICD-10-CM

## 2011-05-09 DIAGNOSIS — M25529 Pain in unspecified elbow: Secondary | ICD-10-CM | POA: Diagnosis present

## 2011-05-09 DIAGNOSIS — E669 Obesity, unspecified: Secondary | ICD-10-CM

## 2011-05-09 DIAGNOSIS — J45909 Unspecified asthma, uncomplicated: Secondary | ICD-10-CM

## 2011-05-09 DIAGNOSIS — M25519 Pain in unspecified shoulder: Secondary | ICD-10-CM

## 2011-05-09 DIAGNOSIS — R002 Palpitations: Secondary | ICD-10-CM

## 2011-05-09 DIAGNOSIS — R5381 Other malaise: Secondary | ICD-10-CM

## 2011-05-09 DIAGNOSIS — L439 Lichen planus, unspecified: Secondary | ICD-10-CM

## 2011-05-09 DIAGNOSIS — Z87891 Personal history of nicotine dependence: Secondary | ICD-10-CM

## 2011-05-09 DIAGNOSIS — I10 Essential (primary) hypertension: Secondary | ICD-10-CM

## 2011-05-09 DIAGNOSIS — I8390 Asymptomatic varicose veins of unspecified lower extremity: Secondary | ICD-10-CM

## 2011-05-09 DIAGNOSIS — H6690 Otitis media, unspecified, unspecified ear: Secondary | ICD-10-CM

## 2011-05-09 DIAGNOSIS — R4182 Altered mental status, unspecified: Principal | ICD-10-CM | POA: Diagnosis present

## 2011-05-09 DIAGNOSIS — F329 Major depressive disorder, single episode, unspecified: Secondary | ICD-10-CM

## 2011-05-09 LAB — CBC
HCT: 40.1 % (ref 36.0–46.0)
Hemoglobin: 13.3 g/dL (ref 12.0–15.0)
MCH: 28.1 pg (ref 26.0–34.0)
MCHC: 33.2 g/dL (ref 30.0–36.0)
MCV: 84.6 fL (ref 78.0–100.0)
Platelets: 363 10*3/uL (ref 150–400)
RBC: 4.74 MIL/uL (ref 3.87–5.11)
RDW: 16.1 % — ABNORMAL HIGH (ref 11.5–15.5)
WBC: 6.4 10*3/uL (ref 4.0–10.5)

## 2011-05-09 LAB — URINALYSIS, ROUTINE W REFLEX MICROSCOPIC
Bilirubin Urine: NEGATIVE
Glucose, UA: NEGATIVE mg/dL
Hgb urine dipstick: NEGATIVE
Ketones, ur: NEGATIVE mg/dL
Leukocytes, UA: NEGATIVE
Nitrite: NEGATIVE
Protein, ur: NEGATIVE mg/dL
Specific Gravity, Urine: 1.029 (ref 1.005–1.030)
Urobilinogen, UA: 0.2 mg/dL (ref 0.0–1.0)
pH: 6 (ref 5.0–8.0)

## 2011-05-09 LAB — RAPID URINE DRUG SCREEN, HOSP PERFORMED
Amphetamines: NOT DETECTED
Barbiturates: NOT DETECTED
Benzodiazepines: NOT DETECTED
Cocaine: NOT DETECTED
Opiates: NOT DETECTED
Tetrahydrocannabinol: NOT DETECTED

## 2011-05-09 LAB — BASIC METABOLIC PANEL WITH GFR
CO2: 28 meq/L (ref 19–32)
Chloride: 103 meq/L (ref 96–112)
GFR calc non Af Amer: >90 mL/min (ref >90)
Glucose, Bld: 121 mg/dL — ABNORMAL HIGH (ref 70–99)

## 2011-05-09 LAB — DIFFERENTIAL
Basophils Absolute: 0 10*3/uL (ref 0.0–0.1)
Basophils Relative: 1 % (ref 0–1)
Eosinophils Absolute: 0.1 10*3/uL (ref 0.0–0.7)
Eosinophils Relative: 2 % (ref 0–5)
Lymphocytes Relative: 44 % (ref 12–46)
Lymphs Abs: 2.8 10*3/uL (ref 0.7–4.0)
Monocytes Absolute: 0.5 K/uL (ref 0.1–1.0)
Monocytes Relative: 8 % (ref 3–12)
Neutro Abs: 3 K/uL (ref 1.7–7.7)
Neutrophils Relative %: 46 % (ref 43–77)

## 2011-05-09 LAB — BASIC METABOLIC PANEL
BUN: 13 mg/dL (ref 6–23)
Calcium: 10.2 mg/dL (ref 8.4–10.5)
Creatinine, Ser: 0.57 mg/dL (ref 0.50–1.10)
GFR calc Af Amer: >90 mL/min (ref >90)
Potassium: 3.7 mEq/L (ref 3.5–5.1)
Sodium: 141 mEq/L (ref 135–145)

## 2011-05-09 LAB — TROPONIN I: Troponin I: <0.3 ng/mL (ref <0.30)

## 2011-05-09 LAB — GLUCOSE, CAPILLARY: Glucose-Capillary: 133 mg/dL — ABNORMAL HIGH (ref 70–99)

## 2011-05-09 MED ORDER — LISINOPRIL 40 MG PO TABS
40.0000 mg | ORAL_TABLET | Freq: Every day | ORAL | Status: DC
  Administered 2011-05-09 – 2011-05-12 (×4): 40 mg via ORAL
  Filled 2011-05-09 (×5): qty 1

## 2011-05-09 MED ORDER — POLYETHYLENE GLYCOL 3350 17 G PO PACK
17.0000 g | PACK | Freq: Every day | ORAL | Status: DC | PRN
  Filled 2011-05-09: qty 1

## 2011-05-09 MED ORDER — HYDROCODONE-ACETAMINOPHEN 5-325 MG PO TABS
1.0000 | ORAL_TABLET | ORAL | Status: DC | PRN
  Administered 2011-05-09: 2 via ORAL
  Administered 2011-05-09: 1 via ORAL
  Administered 2011-05-10 (×2): 2 via ORAL
  Administered 2011-05-11: 1 via ORAL
  Administered 2011-05-12: 2 via ORAL
  Filled 2011-05-09 (×2): qty 2
  Filled 2011-05-09 (×2): qty 1
  Filled 2011-05-09 (×2): qty 2

## 2011-05-09 MED ORDER — GABAPENTIN 100 MG PO CAPS
100.0000 mg | ORAL_CAPSULE | Freq: Three times a day (TID) | ORAL | Status: DC
  Administered 2011-05-09 – 2011-05-12 (×10): 100 mg via ORAL
  Filled 2011-05-09 (×14): qty 1

## 2011-05-09 MED ORDER — ALBUTEROL SULFATE HFA 108 (90 BASE) MCG/ACT IN AERS
2.0000 | INHALATION_SPRAY | Freq: Four times a day (QID) | RESPIRATORY_TRACT | Status: DC | PRN
  Filled 2011-05-09: qty 6.7

## 2011-05-09 MED ORDER — PANTOPRAZOLE SODIUM 40 MG PO TBEC
40.0000 mg | DELAYED_RELEASE_TABLET | Freq: Every day | ORAL | Status: DC
  Administered 2011-05-09 – 2011-05-12 (×4): 40 mg via ORAL
  Filled 2011-05-09 (×5): qty 1

## 2011-05-09 MED ORDER — LABETALOL HCL 5 MG/ML IV SOLN
20.0000 mg | Freq: Once | INTRAVENOUS | Status: DC
  Filled 2011-05-09: qty 4

## 2011-05-09 MED ORDER — POTASSIUM CHLORIDE 20 MEQ/15ML (10%) PO LIQD
20.0000 meq | Freq: Every day | ORAL | Status: DC
  Administered 2011-05-09 – 2011-05-12 (×4): 20 meq via ORAL
  Filled 2011-05-09 (×5): qty 15

## 2011-05-09 MED ORDER — LABETALOL HCL 5 MG/ML IV SOLN
10.0000 mg | Freq: Once | INTRAVENOUS | Status: AC
  Administered 2011-05-09: 10 mg via INTRAVENOUS
  Filled 2011-05-09: qty 4

## 2011-05-09 MED ORDER — CLONIDINE HCL 0.2 MG PO TABS
0.2000 mg | ORAL_TABLET | Freq: Two times a day (BID) | ORAL | Status: DC
  Administered 2011-05-09 – 2011-05-12 (×6): 0.2 mg via ORAL
  Filled 2011-05-09 (×9): qty 1

## 2011-05-09 MED ORDER — HEPARIN SODIUM (PORCINE) 5000 UNIT/ML IJ SOLN
5000.0000 [IU] | Freq: Three times a day (TID) | INTRAMUSCULAR | Status: DC
  Administered 2011-05-09 – 2011-05-12 (×9): 5000 [IU] via SUBCUTANEOUS
  Filled 2011-05-09 (×14): qty 1

## 2011-05-09 MED ORDER — SODIUM CHLORIDE 0.9 % IV SOLN
INTRAVENOUS | Status: DC
  Administered 2011-05-09: 19:00:00 via INTRAVENOUS
  Administered 2011-05-10: 75 mL/h via INTRAVENOUS
  Administered 2011-05-11 – 2011-05-12 (×2): via INTRAVENOUS

## 2011-05-09 MED ORDER — LABETALOL HCL 5 MG/ML IV SOLN
10.0000 mg | INTRAVENOUS | Status: DC | PRN
  Filled 2011-05-09: qty 4

## 2011-05-09 MED ORDER — ASPIRIN 81 MG PO TABS
160.0000 mg | ORAL_TABLET | Freq: Every day | ORAL | Status: DC
  Administered 2011-05-09 – 2011-05-12 (×4): 162 mg via ORAL
  Filled 2011-05-09 (×5): qty 2

## 2011-05-09 MED ORDER — ACETAMINOPHEN 650 MG RE SUPP: 650.0000 mg | Freq: Four times a day (QID) | RECTAL | Status: DC | PRN

## 2011-05-09 MED ORDER — ACETAMINOPHEN 325 MG PO TABS
650.0000 mg | ORAL_TABLET | Freq: Four times a day (QID) | ORAL | Status: DC | PRN
  Administered 2011-05-11 (×2): 650 mg via ORAL
  Filled 2011-05-09 (×2): qty 2

## 2011-05-09 MED ORDER — SODIUM CHLORIDE 0.9 % IV SOLN
Freq: Once | INTRAVENOUS | Status: AC
  Administered 2011-05-09: 12:00:00 via INTRAVENOUS

## 2011-05-09 NOTE — ED Provider Notes (Signed)
History     CSN: [id number]  Arrival date & time 05/09/11  4098   First MD Initiated Contact with Patient 05/09/11 1008      Chief Complaint  Patient presents with  . Altered Mental Status  . Weakness    (Consider location/radiation/quality/duration/timing/severity/associated sxs/prior treatment) HPI Comments: Level 5 caveat due to either altered mental status or patient uncooperativeness.  Also, spouse is very upset and seems exacerbated by patient's condition and her not wanting to come to the hospital for past 2 weeks and also his belief that the medications that she has been on for past 10 years are causing these episodes.  The patient has a history of diabetes and hypertension poorly controlled. She has a history of a small heart attack per the spouse which did not require any intervention. The patient quit smoking about 15 years ago, does not do drugs or alcohol. Socially, the brother of the patient's spouse passed away last night from a heart attack. Spouse reports that the patient has had similar passing out episodes with generalized feeling "sick" and not eating much over the last one to 2 weeks. She has also been complaining of left upper extremity discomfort. He notes that she has been more diaphoretic than usual over the last several weeks. She denies a headache or chest pain or upper extremity pain to me at this moment. Patient with mumbling speech with occasionally clear speech. She does try to follow commands and will try to answer questions but will halt her speech and grimace at times.  Pt's daughter called spouse to come home to see her and he found her passed on on the floor.  She was breathing on her own.  He did manage to get her into his car and drove her here.    Patient is a 56 y.o. female presenting with altered mental status and weakness. The history is provided by the patient and the spouse. The history is limited by the condition of the patient.  Altered Mental  Status  Weakness The primary symptoms include altered mental status.  Additional symptoms include weakness.    Past Medical History  Diagnosis Date  . Hypertension   . Asthma   . Diabetes mellitus   . MI, old     Past Surgical History  Procedure Date  . Abdominal hysterectomy   . Appendectomy     History reviewed. No pertinent family history.  History  Substance Use Topics  . Smoking status: Former Games developer  . Smokeless tobacco: Not on file  . Alcohol Use: No    OB History    Grav Para Term Preterm Abortions TAB SAB Ect Mult Living                  Review of Systems  Unable to perform ROS Neurological: Positive for weakness.  Psychiatric/Behavioral: Positive for altered mental status.    Allergies  Review of patient's allergies indicates no known allergies.  Home Medications   Current Outpatient Rx  Name Route Sig Dispense Refill  . ALBUTEROL SULFATE HFA 108 (90 BASE) MCG/ACT IN AERS Inhalation Inhale 2 puffs into the lungs every 6 (six) hours as needed.    . ASPIRIN 81 MG PO TABS Oral Take 160 mg by mouth daily.    Marland Kitchen CLONIDINE HCL 0.2 MG PO TABS Oral Take 0.2 mg by mouth 2 (two) times daily.    Marland Kitchen GABAPENTIN 100 MG PO CAPS Oral Take 100 mg by mouth 3 (three) times daily.    Marland Kitchen  LISINOPRIL 40 MG PO TABS Oral Take 40 mg by mouth daily.    Marland Kitchen PANTOPRAZOLE SODIUM 40 MG PO TBEC Oral Take 40 mg by mouth daily.    Marland Kitchen POTASSIUM CHLORIDE 20 MEQ/15ML (10%) PO LIQD Oral Take 20 mEq by mouth daily.      BP 163/92  Pulse 70  SpO2 96%  Physical Exam  Vitals reviewed. Constitutional: She appears well-developed and well-nourished. She appears listless. She is uncooperative.  HENT:  Head: Normocephalic and atraumatic.  Eyes: Conjunctivae are normal. Pupils are equal, round, and reactive to light.  Cardiovascular: Normal rate, regular rhythm, S1 normal, S2 normal and normal pulses.   No extrasystoles are present. Exam reveals no gallop.   No murmur  heard. Pulmonary/Chest: Effort normal and breath sounds normal.  Abdominal: Soft. Normal appearance and bowel sounds are normal. There is no tenderness.  Neurological: She appears listless. GCS eye subscore is 2. GCS verbal subscore is 5. GCS motor subscore is 6.       No facial droop. Patient will keep arms raised in the air when I placed in an air. I question the patient's effort. While the patient was appearing to be passed out, the patient did display enough muscle tone not to cause self injury to her self.    ED Course  Procedures (including critical care time)  Labs Reviewed  GLUCOSE, CAPILLARY - Abnormal; Notable for the following:    Glucose-Capillary 133 (*)    All other components within normal limits  CBC - Abnormal; Notable for the following:    RDW 16.1 (*)    All other components within normal limits  BASIC METABOLIC PANEL - Abnormal; Notable for the following:    Glucose, Bld 121 (*)    All other components within normal limits  DIFFERENTIAL  TROPONIN I  URINALYSIS, ROUTINE W REFLEX MICROSCOPIC  URINE RAPID DRUG SCREEN (HOSP PERFORMED)   Ct Head Wo Contrast  05/09/2011  *RADIOLOGY REPORT*  Clinical Data: Altered mental status, hypertension.  CT HEAD WITHOUT CONTRAST  Technique:  Contiguous axial images were obtained from the base of the skull through the vertex without contrast.  Comparison: 11/24/2010  Findings: Tiny hypodensity within the right thalamus is nonspecific. There are mild subcortical and periventricular white matter hypodensities, a nonspecific finding most often seen with chronic microangiopathic changes.  There is no evidence for acute hemorrhage, overt hydrocephalus, mass lesion, or abnormal extra-axial fluid collection.  No definite CT evidence for acute cortical based (large artery) infarction.  IMPRESSION: Mild white matter hypodensities, a nonspecific finding most often seen with chronic microangiopathic change.  Tiny right thalamus hypodensity may  represent an age indeterminate lacunar infarction.  Otherwise, no definite acute intracranial abnormality identified.  Original Report Authenticated By: Waneta Martins, M.D.   I reviewed the above CT myself.  1. Altered mental status   2. Hypertension   3. Syncope     RA sat is 96% and normal.   Date: 05/09/2011  Rate: 76  Rhythm: normal sinus rhythm  QRS Axis: normal  Intervals: normal  ST/T Wave abnormalities: normal  Conduction Disutrbances: none  Narrative Interpretation:   Old EKG Reviewed: No significant changes noted from 12/11/2008     MDM  Pt with hypertension, will need head CT to assess for hemorrhage.  Clinically, I suspect that there may be a psychiatric component to symptoms, but will noto be able to fully exclude without labs, CT and likely brief admission for period of observation.  Pt did not  take meds yet today.  BP has decreased some since arrival to the ED.  I reviewed the pt's last few ED visits.       11:50 AM  I reviewed pt's CT scan.  Troponin and ECG are ok.  Pt continues to be drowsy and not fully coherent, will give a small dose of labetalol since BP is still elevated, although improved.  Will admit.    12:10 PM [facility] to see and admit.  Gavin Pound. Altha Sweitzer, MD 05/09/11 1610

## 2011-05-09 NOTE — ED Notes (Signed)
Pt states yesterday morning felt "trembly". Husband states pt has been sick at home for past 2 wks n/v/d, and c/o left arm pain. Pt denies pain at present.  Husband states his brother died yesterday at [facility] of heart attack

## 2011-05-09 NOTE — ED Notes (Addendum)
Pt in with husband, c/o n/v and weakness x2 weeks, pt unable to left head or answer all questions, able to follow commands, states her CBG this AM was 136, per husband pt has been altered to this level x1 week but refused to come to hospital, pt has been c/o left arm pain x1 week to husband
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
## 2011-05-10 ENCOUNTER — Inpatient Hospital Stay (HOSPITAL_COMMUNITY): Payer: Self-pay

## 2011-05-10 LAB — COMPREHENSIVE METABOLIC PANEL
ALT: 20 U/L (ref 0–35)
AST: 14 U/L (ref 0–37)
Albumin: 3.4 g/dL — ABNORMAL LOW (ref 3.5–5.2)
Alkaline Phosphatase: 61 U/L (ref 39–117)
CO2: 29 mEq/L (ref 19–32)
Chloride: 107 mEq/L (ref 96–112)
Creatinine, Ser: 0.56 mg/dL (ref 0.50–1.10)
GFR calc non Af Amer: >90 mL/min (ref >90)
Potassium: 4.5 mEq/L (ref 3.5–5.1)
Sodium: 141 mEq/L (ref 135–145)
Total Bilirubin: 0.1 mg/dL — ABNORMAL LOW (ref 0.3–1.2)

## 2011-05-10 LAB — RPR: RPR Ser Ql: NONREACTIVE

## 2011-05-10 LAB — T4, FREE: Free T4: 0.78 ng/dL — ABNORMAL LOW (ref 0.80–1.80)

## 2011-05-10 LAB — MAGNESIUM: Magnesium: 1.8 mg/dL (ref 1.5–2.5)

## 2011-05-10 LAB — HEMOGLOBIN A1C
Hgb A1c MFr Bld: 6.6 % — ABNORMAL HIGH (ref <5.7)
Mean Plasma Glucose: 143 mg/dL — ABNORMAL HIGH (ref <117)

## 2011-05-10 MED ORDER — ONDANSETRON HCL 4 MG/2ML IJ SOLN
4.0000 mg | Freq: Four times a day (QID) | INTRAMUSCULAR | Status: DC | PRN
  Administered 2011-05-10 – 2011-05-12 (×5): 4 mg via INTRAVENOUS
  Filled 2011-05-10 (×5): qty 2

## 2011-05-10 MED ORDER — GADOBENATE DIMEGLUMINE 529 MG/ML IV SOLN
20.0000 mL | Freq: Once | INTRAVENOUS | Status: AC | PRN
  Administered 2011-05-10: 20 mL via INTRAVENOUS
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
## 2011-05-10 NOTE — Progress Notes (Signed)
Subjective: She denies any history of seizures, wanted to go home, I explained to mother the patient needs to be evaluated with an EEG. She has now agreed to stay  Objective: Vital signs in last 24 hours: Filed Vitals:   05/09/11 1705 05/09/11 2118 05/10/11 0655 05/10/11 1005  BP: 145/84 137/80 117/74 136/85  Pulse: 70 67 82 61  Temp: 98 F (36.7 C) 97.6 F (36.4 C) 97.5 F (36.4 C) 98.5 F (36.9 C)  TempSrc: Oral Oral Oral Oral  Resp: 22 20 20 24   Height: 5\' 8"  (1.727 m)     Weight: 107.049 kg (236 lb)     SpO2: 95% 96% 97% 97%    Intake/Output Summary (Last 24 hours) at 05/10/11 1049 Last data filed at 05/10/11 0500  Gross per 24 hour  Intake   1080 ml  Output    301 ml  Net    779 ml    Weight change:   Lungs:  Clear to auscultation bilaterally, respirations unlabored   Chest Wall:  No tenderness or deformity   Heart:  Regular rate and rhythm, S1 and S2 normal, no murmur, rub or gallop   Breast Exam:  No tenderness, masses, or nipple abnormality   Abdomen:  Soft, non-tender, bowel sounds active all four quadrants,  no masses, no organomegaly   Genitalia:  Normal female without lesion, discharge or tenderness   Rectal:  Normal tone, normal prostate, no masses or tenderness;  guaiac negative stool   Extremities:  Extremities normal, atraumatic, no cyanosis or edema. No pain with external rotation of the left or right hip.   Pulses:  2+ and symmetric all extremities   Skin:  Skin color, texture, turgor normal, no rashes or lesions   Lymph nodes:  Cervical, supraclavicular, and axillary nodes normal   Neurologic:  moving all 4 extremities without any difficulties. Reflexes are intact. Cannot perform a complete neurological exam as she is not cooperating. Her left leg has limited movement secondary to pain.   Lab results:   Lab Results: Results for orders placed during the hospital encounter of 05/09/11 (from the past 24 hour(s))  URINE RAPID DRUG SCREEN (HOSP  PERFORMED)     Status: Normal   Collection Time   05/09/11 11:12 AM      Component Value Range   Opiates NONE DETECTED  NONE DETECTED    Cocaine NONE DETECTED  NONE DETECTED    Benzodiazepines NONE DETECTED  NONE DETECTED    Amphetamines NONE DETECTED  NONE DETECTED    Tetrahydrocannabinol NONE DETECTED  NONE DETECTED    Barbiturates NONE DETECTED  NONE DETECTED   HEMOGLOBIN A1C     Status: Abnormal   Collection Time   05/09/11  6:54 PM      Component Value Range   Hemoglobin A1C 6.6 (*) <5.7 (%)   Mean Plasma Glucose 143 (*) <117 (mg/dL)     Micro: No results found for this or any previous visit (from the past 240 hour(s)).  Studies/Results: Dg Hip Complete Left  05/10/2011  *RADIOLOGY REPORT*  Clinical Data: Bilateral hip pain.  LEFT HIP - COMPLETE 2+ VIEW  Comparison: None.  Findings: There is no evidence of fracture or dislocation.  Both femoral heads are seated normally within their respective acetabula.  The proximal left femur is unremarkable in appearance. No significant degenerative change is appreciated.  The sacroiliac joints are unremarkable in appearance.  The visualized bowel gas pattern is grossly unremarkable in appearance.  IMPRESSION: No evidence  of fracture or dislocation.  Original Report Authenticated By: Tonia Ghent, M.D.   Ct Head Wo Contrast  05/09/2011  *RADIOLOGY REPORT*  Clinical Data: Altered mental status, hypertension.  CT HEAD WITHOUT CONTRAST  Technique:  Contiguous axial images were obtained from the base of the skull through the vertex without contrast.  Comparison: 11/24/2010  Findings: Tiny hypodensity within the right thalamus is nonspecific. There are mild subcortical and periventricular white matter hypodensities, a nonspecific finding most often seen with chronic microangiopathic changes.  There is no evidence for acute hemorrhage, overt hydrocephalus, mass lesion, or abnormal extra-axial fluid collection.  No definite CT evidence for acute cortical  based (large artery) infarction.  IMPRESSION: Mild white matter hypodensities, a nonspecific finding most often seen with chronic microangiopathic change.  Tiny right thalamus hypodensity may represent an age indeterminate lacunar infarction.  Otherwise, no definite acute intracranial abnormality identified.  Original Report Authenticated By: Waneta Martins, M.D.   Dg Chest Port 1 View  05/09/2011  *RADIOLOGY REPORT*  Clinical Data: Shortness of breath and upper chest pain.  PORTABLE CHEST - 1 VIEW  Comparison: 12/11/2008  Findings: Single view of the chest demonstrates clear lungs. Stable appearance of the heart and mediastinum.  Trachea is midline.  Negative for a pneumothorax.  IMPRESSION: No acute chest findings.  Original Report Authenticated By: Richarda Overlie, M.D.    Medications:  Scheduled Meds:   . sodium chloride   Intravenous Once  . aspirin  162 mg Oral Daily  . cloNIDine  0.2 mg Oral BID  . gabapentin  100 mg Oral TID  . heparin  5,000 Units Subcutaneous Q8H  . labetalol  10 mg Intravenous Once  . lisinopril  40 mg Oral Daily  . pantoprazole  40 mg Oral Daily  . potassium chloride  20 mEq Oral Daily  . DISCONTD: labetalol  20 mg Intravenous Once   Continuous Infusions:   . sodium chloride 75 mL/hr (05/10/11 0316)   PRN Meds:.acetaminophen, acetaminophen, albuterol, gadobenate dimeglumine, HYDROcodone-acetaminophen, labetalol, ondansetron, polyethylene glycol   Assessment:   Altered mental state (05/09/2011) The CT scan of the head was done that showed no acute processes. As per history it seems that she is acting the same way she was last time she was in the hospital. An MRI was done results are pending at this time, she will need an EEG as well  HYPERTENSION (12/03/2006) There might be a component of pain causing her blood pressure to be high. She is complaining of left hip pain. At this time we'll continue her home meds are labetalol when necessary.   SYNCOPE  (12/20/2008) I am not sure of the reason she syncopized. She did not stool or urineted on herself. At we'll admit her to telemetry to monitor for any kind of arrhythmias we'll cycle her cardiac enzymes. CT of the head was negative. Also the possibility of seizures, but unlikely as she did not bite her tongue or lost control of her sphincters. Blood glucose with in normal. Check a HBg A1c.   Left hip pain (05/09/2011) she keeps complaining of left hip pain and her neurological exam was limited secondary to pain. We'll get an x-ray.   Elbow pain (05/09/2011) x-ray of the elbow she is able to move it but complained of pain.     LOS: 1 day   [facility] 05/10/2011, 10:49 AM

## 2011-05-10 NOTE — Progress Notes (Signed)
05-10-11  Nsg;  Pt is very withdrawn, quiet c flat affect when husband is in room.  Husband is irritable and speaking for and over pt even when I ask a direct question to pt.  Pt c/o of occassional nausea and pain in l hip.  CMS on ble is wnl, but pt requiers full one person assist to get oob to walk to br.   Pain relieved with pain medication.  Urine is clear but dark amber and ivf are running at 75 cc/hr.  Pt does not have an order for nausea medication.  May we have an anti-emetic order please.

## 2011-05-11 ENCOUNTER — Other Ambulatory Visit (HOSPITAL_COMMUNITY): Payer: Self-pay

## 2011-05-11 ENCOUNTER — Inpatient Hospital Stay (HOSPITAL_COMMUNITY): Payer: Self-pay

## 2011-05-11 MED ORDER — METHOCARBAMOL 500 MG PO TABS: 500.0000 mg | ORAL_TABLET | Freq: Four times a day (QID) | ORAL | Status: AC | PRN

## 2011-05-11 MED ORDER — GADOBENATE DIMEGLUMINE 529 MG/ML IV SOLN
20.0000 mL | Freq: Once | INTRAVENOUS | Status: AC | PRN
  Administered 2011-05-11: 20 mL via INTRAVENOUS
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
## 2011-05-11 NOTE — Evaluation (Signed)
Physical Therapy Evaluation Patient Details Name: Tammy [name] MRN: [id number] DOB: 02/09/1956 Today's Date: 05/11/2011  Problem List:  Patient Active Problem List  Diagnoses  . DIABETES MELLITUS, TYPE II  . OBESITY  . DEPRESSION  . OBSTRUCTIVE SLEEP APNEA  . OTITIS MEDIA, ACUTE, RIGHT  . HYPERTENSION  . BRADYCARDIA  . VARICOSE VEINS, LOWER EXTREMITIES  . ASTHMA NOS W/O STATUS ASTHMATICUS  . GERD  . VAGINITIS  . LICHEN PLANUS  . ARTHRALGIA  . SCIATICA, RIGHT  . GANGLION CYST, WRIST, RIGHT  . SYNCOPE  . FATIGUE  . PALPITATIONS  . ABDOMINAL PAIN, GENERALIZED  . HEADACHE  . CERVICAL STRAIN  . LOCALIZED SUPERFICIAL SWELLING MASS OR LUMP  . SHOULDER PAIN, RIGHT  . Left hip pain  . Left elbow pain  . Altered mental state    Past Medical History:  Past Medical History  Diagnosis Date  . Hypertension   . Asthma   . Diabetes mellitus   . MI, old    Past Surgical History:  Past Surgical History  Procedure Date  . Abdominal hysterectomy   . Appendectomy     PT Assessment/Plan/Recommendation PT Assessment Clinical Impression Statement: Pt presents with diagnosis of HTN, syncope, left hip pain. At time of eval pt c/o bil hip pain. Pt's mobility is significantly limited by pain but pt was able to ambulate with much effort. Pt repeatedly stating she is leaving hospital today, regardless of recommendations. Pt is reluctant to follow therapist's cues/recommendations.  Pt will benefit from skilled PT in the acute care setting to maximize independence, activity tolerance and to improve gait.  PT Recommendation/Assessment: Patient will need skilled PT in the acute care venue PT Problem List: Decreased activity tolerance;Decreased mobility;Pain;Decreased knowledge of use of DME;Decreased strength;Decreased range of motion PT Therapy Diagnosis : Difficulty walking;Abnormality of gait;Acute pain PT Plan PT Frequency: Min 3X/week PT Treatment/Interventions: DME instruction;Gait  training;Functional mobility training;Therapeutic activities;Therapeutic exercise;Patient/family education PT Recommendation Recommendations for Other Services: OT consult. May benefit from Ortho consult.  Follow Up Recommendations: Home health PT vs Outpatient PT;Supervision/Assistance - 24 hour, at least initially. Pt initially refusing follow-up PT but reluctantly agreed to HHPT. (Outpatient PT may be more beneficial, depending on progress). Equipment Recommended: Rolling walker with 5" wheels PT Goals  Acute Rehab PT Goals PT Goal Formulation: With patient Time For Goal Achievement: 7 days Pt will go Sit to Stand: with modified independence PT Goal: Sit to Stand - Progress: Goal set today Pt will go Stand to Sit: with modified independence PT Goal: Stand to Sit - Progress: Goal set today Pt will Ambulate: 51 - 150 feet;with modified independence;with least restrictive assistive device PT Goal: Ambulate - Progress: Goal set today Pt will Go Up / Down Stairs: 1-2 stairs;with least restrictive assistive device;with min assist PT Goal: Up/Down Stairs - Progress: Goal set today Pt will Perform Home Exercise Program: with supervision, verbal cues required/provided PT Goal: Perform Home Exercise Program - Progress: Goal set today  PT Evaluation Precautions/Restrictions  Precautions Precautions: Fall Prior Functioning  Home Living Lives With: Daughter Receives Help From: Family Type of Home: House Home Layout: One level Home Access: Stairs to enter Entrance Stairs-Rails: None Entrance Stairs-Number of Steps: 2 Additional Comments: has access to [facility]. Prior Function Level of Independence: Independent with basic ADLs;Independent with gait;Independent with homemaking with ambulation Cognition   Sensation/Coordination Sensation Light Touch: Appears Intact Coordination Gross Motor Movements are Fluid and Coordinated: Yes Extremity Assessment RLE Strength RLE Overall Strength  Comments: Pt able  to raise extremities against gravity-did not apply resistance due to pain LLE Strength LLE Overall Strength Comments: Able to raise extremities against gravity-did not apply resistance due to pain Mobility (including Balance) Bed Mobility Bed Mobility: Yes Supine to Sit: 6: Modified independent (Device/Increase time);HOB elevated (Comment degrees);With rails Supine to Sit Details (indicate cue type and reason): Increased time due to pain.  Sit to Supine: 6: Modified independent (Device/Increase time);HOB elevated (comment degrees);With rail Sit to Supine - Details (indicate cue type and reason): Increased time due to pain Transfers Transfers: Yes Sit to Stand Details (indicate cue type and reason): Min-guard assist. VCs safety, technique, hand placement.  Stand to Sit Details: Min-guard assist. VCs safety, technique, hand placement.  Ambulation/Gait Ambulation/Gait: Yes Ambulation/Gait Assistance Details (indicate cue type and reason): Min-guard assist. VCs safety. Increased time, slow gait speed. Antalgic gait pattern with heavy reliance on bil UEs. Fatigues easily. 4-5 standing rest breaks.  Ambulation Distance (Feet): 50 Feet Assistive device: Rolling walker Gait Pattern: Trunk flexed;Step-through pattern;Decreased step length - right;Decreased step length - left;Decreased stride length;Antalgic  Posture/Postural Control Posture/Postural Control: Postural limitations Postural Limitations: Flexed posturing intermittently but pt able to self correct Exercise    End of Session PT - End of Session Equipment Utilized During Treatment: Gait belt Activity Tolerance: Patient limited by pain Patient left: with call bell in reach;in bed;with family/visitor present General Behavior During Session: Agitated Cognition: WFL for tasks performed  Rebeca Alert [facility] 05/11/2011, 4:30 PM [phone]
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
## 2011-05-12 ENCOUNTER — Inpatient Hospital Stay (HOSPITAL_COMMUNITY)
Admit: 2011-05-12 | Discharge: 2011-05-12 | Disposition: A | Discharge status: Home / Self Care | Payer: Self-pay | Attending: Internal Medicine | Admitting: Internal Medicine

## 2011-05-12 MED ORDER — HYDROCODONE-ACETAMINOPHEN 5-325 MG PO TABS: 1.0000 | ORAL_TABLET | ORAL | Status: AC | PRN

## 2011-05-12 MED ORDER — PROMETHAZINE HCL 12.5 MG PO TABS: 12.5000 mg | ORAL_TABLET | Freq: Four times a day (QID) | ORAL | Status: DC | PRN

## 2011-05-12 MED ORDER — ASPIRIN EC 81 MG PO TBEC
162.0000 mg | DELAYED_RELEASE_TABLET | Freq: Every day | ORAL | Status: DC
  Filled 2011-05-12: qty 2

## 2011-05-12 NOTE — Progress Notes (Signed)
EEG completed.

## 2011-05-12 NOTE — Discharge Summary (Signed)
Physician Discharge Summary  Tammy [name] MRN: [id number] DOB/AGE: 05-05-1955 56 y.o.  PCP: [name], NP, NP   Admit date: 05/09/2011 Discharge date: 05/12/2011   EEG results are still pending at this time  Updated medication list  Medication List  As of 05/12/2011  1:48 PM   TAKE these medications         albuterol 108 (90 BASE) MCG/ACT inhaler   Commonly known as: PROVENTIL HFA;VENTOLIN HFA   Inhale 2 puffs into the lungs every 6 (six) hours as needed. For shortness of breath.      aspirin EC 81 MG tablet   Take 81 mg by mouth daily.      cloNIDine 0.2 MG tablet   Commonly known as: CATAPRES   Take 0.2 mg by mouth 2 (two) times daily.      furosemide 40 MG tablet   Commonly known as: LASIX   Take 40 mg by mouth daily.      gabapentin 100 MG capsule   Commonly known as: NEURONTIN   Take 100 mg by mouth 3 (three) times daily.      HYDROcodone-acetaminophen 5-325 MG per tablet   Commonly known as: NORCO   Take 1-2 tablets by mouth every 4 (four) hours as needed.      lisinopril 40 MG tablet   Commonly known as: PRINIVIL,ZESTRIL   Take 40 mg by mouth daily.      methocarbamol 500 MG tablet   Commonly known as: ROBAXIN   Take 1 tablet (500 mg total) by mouth 4 (four) times daily as needed.      pantoprazole 40 MG tablet   Commonly known as: PROTONIX   Take 40 mg by mouth daily.      potassium chloride SA 20 MEQ tablet   Commonly known as: K-DUR,KLOR-CON   Take 20 mEq by mouth daily.      promethazine 12.5 MG tablet   Commonly known as: PHENERGAN   Take 1 tablet (12.5 mg total) by mouth every 6 (six) hours as needed for nausea.          Discharge Exam:  Blood pressure 147/78, pulse 53, temperature 98.2 F (36.8 C), temperature source Oral, resp. rate 18, height 5\' 8"  (1.727 m), weight 101.5 kg (223 lb 12.3 oz), SpO2 97.00%.  Lungs:  Clear to auscultation bilaterally, respirations unlabored   Chest Wall:  No tenderness or deformity     Heart:  Regular rate and rhythm, S1 and S2 normal, no murmur, rub or gallop   Breast Exam:  No tenderness, masses, or nipple abnormality   Abdomen:  Soft, non-tender, bowel sounds active all four quadrants,  no masses, no organomegaly   Genitalia:  Normal female without lesion, discharge or tenderness   Rectal:  Normal tone, normal prostate, no masses or tenderness;  guaiac negative stool   Extremities:  Extremities normal, atraumatic, no cyanosis or edema. No pain with external rotation of the left or right hip.   Pulses:  2+ and symmetric all extremities   Skin:  Skin color, texture, turgor normal, no rashes or lesions   Lymph nodes:  Cervical, supraclavicular, and axillary nodes normal   Neurologic:  moving all 4 extremities without any difficulties. Reflexes are intact. Cannot perform a complete neurological exam as she is not cooperating. Her left leg has limited movement secondary to pain.         Discharge Orders    Future Orders Please Complete By Expires  Diet - low sodium heart healthy      Increase activity slowly      Call MD for:  severe uncontrolled pain      Call MD for:  temperature >100.4      Call MD for:  persistant nausea and vomiting         Follow-up Information    Follow up with MARTIN,NYKEDTRA, NP in 1 week.         SignedRicharda Overlie 05/12/2011, 1:48 PM

## 2011-05-13 NOTE — Procedures (Signed)
EEG NUMBER:  [id number].  HISTORY:  A 56 year old woman with syncope.  MEDICATIONS:  Neurontin.  CONDITION OF RECORDING:  This 16-lead EEG was recorded with the patient in the awake and drowsy states.  Background rhythm: background patterns in wakefulness were well organized with a well-sustained posterior dominant rhythm of 10 Hz, symmetrical and reactive to eye opening and closing.  Drowsiness was associated with mild attenuation of voltage and slowing frequencies.  Abnormal potentials: no epileptiform activity or focal slowing was noted.  ACTIVATION PROCEDURES:  Hyperventilation was not performed.  Photic stimulation was not performed.  EKG:  Single-channel of EKG monitoring was unremarkable.  IMPRESSION:  This was a normal awake and drowsy EEG.  A normal EEG does not rule out the clinical diagnosis of epilepsy.  If clinically warranted, a repeat extended EEG or ambulatory recording may be obtained for prolonged recording times and sleep capture, which may increase the  diagnostic yield.  Clinical correlation is suggested.          ______________________________ Carmell Austria, MD    OZ:HYQM D:  05/12/2011 13:13:17  T:  05/13/2011 01:38:04  Job #:  [id number]

## 2011-05-23 ENCOUNTER — Emergency Department (HOSPITAL_COMMUNITY)
Admission: EM | Admit: 2011-05-23 | Discharge: 2011-05-24 | Disposition: A | Discharge status: Home / Self Care | Payer: Self-pay | Attending: Emergency Medicine | Admitting: Emergency Medicine

## 2011-05-23 ENCOUNTER — Emergency Department (HOSPITAL_COMMUNITY): Payer: Self-pay

## 2011-05-23 ENCOUNTER — Other Ambulatory Visit: Payer: Self-pay

## 2011-05-23 ENCOUNTER — Encounter (HOSPITAL_COMMUNITY): Payer: Self-pay | Admitting: *Deleted

## 2011-05-23 DIAGNOSIS — R112 Nausea with vomiting, unspecified: Secondary | ICD-10-CM | POA: Insufficient documentation

## 2011-05-23 DIAGNOSIS — E876 Hypokalemia: Secondary | ICD-10-CM | POA: Insufficient documentation

## 2011-05-23 DIAGNOSIS — I10 Essential (primary) hypertension: Secondary | ICD-10-CM | POA: Insufficient documentation

## 2011-05-23 DIAGNOSIS — R109 Unspecified abdominal pain: Secondary | ICD-10-CM | POA: Insufficient documentation

## 2011-05-23 DIAGNOSIS — R0789 Other chest pain: Secondary | ICD-10-CM | POA: Insufficient documentation

## 2011-05-23 DIAGNOSIS — Z79899 Other long term (current) drug therapy: Secondary | ICD-10-CM | POA: Insufficient documentation

## 2011-05-23 LAB — GLUCOSE, CAPILLARY: Glucose-Capillary: 101 mg/dL — ABNORMAL HIGH (ref 70–99)

## 2011-05-23 LAB — CBC
MCH: 28.1 pg (ref 26.0–34.0)
MCHC: 33.2 g/dL (ref 30.0–36.0)
Platelets: 383 10*3/uL (ref 150–400)
RDW: 16.2 % — ABNORMAL HIGH (ref 11.5–15.5)

## 2011-05-23 LAB — POCT I-STAT TROPONIN I: Troponin i, poc: 0 ng/mL (ref 0.00–0.08)

## 2011-05-23 MED ORDER — ONDANSETRON HCL 4 MG/2ML IJ SOLN
4.0000 mg | INTRAMUSCULAR | Status: DC | PRN
  Administered 2011-05-23: 4 mg via INTRAVENOUS
  Filled 2011-05-23 (×2): qty 2

## 2011-05-23 MED ORDER — SODIUM CHLORIDE 0.9 % IV BOLUS (SEPSIS)
750.0000 mL | INTRAVENOUS | Status: AC
  Administered 2011-05-23: 750 mL via INTRAVENOUS

## 2011-05-23 MED ORDER — FAMOTIDINE IN NACL 20-0.9 MG/50ML-% IV SOLN
20.0000 mg | Freq: Once | INTRAVENOUS | Status: AC
  Administered 2011-05-24: 20 mg via INTRAVENOUS
  Filled 2011-05-23: qty 50

## 2011-05-23 MED ORDER — SODIUM CHLORIDE 0.9 % IV SOLN
INTRAVENOUS | Status: DC
  Administered 2011-05-24: 01:00:00 via INTRAVENOUS

## 2011-05-23 NOTE — ED Notes (Signed)
MD at bedside. 

## 2011-05-23 NOTE — ED Notes (Signed)
Pt c/o central CP, substernal, slightly reproducible w/ palpation. Pt w/ hx of acid reflux. Pt states n/v/d since Thursday. Pt is short of breath, presents w/ c/o nausea. Pt has hypo bowel sounds w/ firm stomach.

## 2011-05-23 NOTE — ED Provider Notes (Signed)
History     CSN: [id number]  Arrival date & time 05/23/11  2251   First MD Initiated Contact with Patient 05/23/11 2303      Chief Complaint  Patient presents with  . Chest Pain  . Nausea  . Emesis    (Consider location/radiation/quality/duration/timing/severity/associated sxs/prior treatment) HPI  Patient relates 2 nights ago she started having nausea and vomiting. She states she's had hard stools alternating with soft stools. She denies any abdominal pain. She states today about 2 PM she started having intermittent chest pain that only lasted a few seconds. She describes it as sharp or burning. She also started having some mild shortness of breath today. She denies cough, fever. She states she's had vomiting 4-6 times a day. She relates she was admitted to the hospital recently for evaluation of her chest pain that was normal. Her friend who is in the room states she's actually had nausea for the past year. She states she's never seen a gastroenterologist, she's never had endoscopy or colonoscopy.  PCP [facility], Dr. Clelia Croft has an appointment in 2 days for hospital followup for hypertension and chest pain  Past Medical History  Diagnosis Date  . Hypertension   . Acid reflux     Past Surgical History  Procedure Date  . Appendectomy     History reviewed. No pertinent family history.  History  Substance Use Topics  . Smoking status: No  . Smokeless tobacco: Not on file  . Alcohol Use: No  Lives at home with her daughter  OB History    Grav Para Term Preterm Abortions TAB SAB Ect Mult Living                  Review of Systems  All other systems reviewed and are negative.    Allergies  Review of patient's allergies indicates no known allergies.  Home Medications   Current Outpatient Rx  Name Route Sig Dispense Refill  . ALBUTEROL SULFATE HFA 108 (90 BASE) MCG/ACT IN AERS Inhalation Inhale 2 puffs into the lungs every 6 (six) hours as  needed.    . ASPIRIN EC 81 MG PO TBEC Oral Take 81 mg by mouth daily.    Marland Kitchen CLONIDINE HCL 0.2 MG PO TABS Oral Take 0.2 mg by mouth 2 (two) times daily.    . FUROSEMIDE 40 MG PO TABS Oral Take 40 mg by mouth daily.    Marland Kitchen GABAPENTIN 100 MG PO CAPS Oral Take 100 mg by mouth 3 (three) times daily.    Marland Kitchen LISINOPRIL 40 MG PO TABS Oral Take 40 mg by mouth daily.    Marland Kitchen PANTOPRAZOLE SODIUM 40 MG PO TBEC Oral Take 40 mg by mouth daily.    Marland Kitchen POTASSIUM CHLORIDE CRYS ER 20 MEQ PO TBCR Oral Take 20 mEq by mouth daily.      BP 170/99  Pulse 91  Resp 27  SpO2 99%  Vital signs normal except hypertension   Physical Exam  Nursing note and vitals reviewed. Constitutional: She is oriented to person, place, and time. She appears well-developed and well-nourished.  Non-toxic appearance. She does not appear ill. No distress.  HENT:  Head: Normocephalic and atraumatic.  Right Ear: External ear normal.  Left Ear: External ear normal.  Nose: Nose normal. No mucosal edema or rhinorrhea.  Mouth/Throat: Mucous membranes are normal. No dental abscesses or uvula swelling.       Mucous membranes dry  Eyes: Conjunctivae and EOM are normal.  Pupils are equal, round, and reactive to light.  Neck: Normal range of motion and full passive range of motion without pain. Neck supple.  Cardiovascular: Normal rate, regular rhythm and normal heart sounds.  Exam reveals no gallop and no friction rub.   No murmur heard. Pulmonary/Chest: Effort normal and breath sounds normal. No respiratory distress. She has no wheezes. She has no rhonchi. She has no rales. She exhibits no tenderness and no crepitus.  Abdominal: Soft. Normal appearance and bowel sounds are normal. She exhibits no distension. There is no tenderness. There is no rebound and no guarding.       Obese  Musculoskeletal: Normal range of motion. She exhibits no edema and no tenderness.       Moves all extremities well.   Neurological: She is alert and oriented to person,  place, and time. She has normal strength. No cranial nerve deficit.  Skin: Skin is warm, dry and intact. No rash noted. No erythema. No pallor.  Psychiatric: Her speech is normal and behavior is normal. Her mood appears not anxious.       Affect flat    ED Course  Procedures (including critical care time)   Medications  0.9 %  sodium chloride infusion (  Intravenous New Bag/Given 05/24/11 0103)  ondansetron (ZOFRAN) injection 4 mg (4 mg Intravenous Given 05/23/11 2352)  sodium chloride 0.9 % bolus 750 mL (750 mL Intravenous Given 05/23/11 2351)  famotidine (PEPCID) IVPB 20 mg (20 mg Intravenous Given 05/24/11 0102)  potassium chloride SA (K-DUR,KLOR-CON) CR tablet 40 mEq (40 mEq Oral Given 05/24/11 0150)   Pt is feeling better and is ready to go home   Results for orders placed during the hospital encounter of 05/23/11  CBC      Component Value Range   WBC 5.7  4.0 - 10.5 (K/uL)   RBC 4.59  3.87 - 5.11 (MIL/uL)   Hemoglobin 12.9  12.0 - 15.0 (g/dL)   HCT 16.1  09.6 - 04.5 (%)   MCV 84.7  78.0 - 100.0 (fL)   MCH 28.1  26.0 - 34.0 (pg)   MCHC 33.2  30.0 - 36.0 (g/dL)   RDW 40.9 (*) 81.1 - 15.5 (%)   Platelets 383  150 - 400 (K/uL)  GLUCOSE, CAPILLARY      Component Value Range   Glucose-Capillary 101 (*) 70 - 99 (mg/dL)  COMPREHENSIVE METABOLIC PANEL      Component Value Range   Sodium 139  135 - 145 (mEq/L)   Potassium 3.2 (*) 3.5 - 5.1 (mEq/L)   Chloride 105  96 - 112 (mEq/L)   CO2 24  19 - 32 (mEq/L)   Glucose, Bld 106 (*) 70 - 99 (mg/dL)   BUN 7  6 - 23 (mg/dL)   Creatinine, Ser 9.14 (*) 0.50 - 1.10 (mg/dL)   Calcium 8.9  8.4 - 78.2 (mg/dL)   Total Protein 7.4  6.0 - 8.3 (g/dL)   Albumin 3.6  3.5 - 5.2 (g/dL)   AST 20  0 - 37 (U/L)   ALT 38 (*) 0 - 35 (U/L)   Alkaline Phosphatase 55  39 - 117 (U/L)   Total Bilirubin 0.2 (*) 0.3 - 1.2 (mg/dL)   GFR calc non Af Amer >90  >90 (mL/min)   GFR calc Af Amer >90  >90 (mL/min)  LIPASE, BLOOD      Component Value Range   Lipase  22  11 - 59 (U/L)  URINALYSIS, ROUTINE W REFLEX MICROSCOPIC  Component Value Range   Color, Urine YELLOW  YELLOW    APPearance CLEAR  CLEAR    Specific Gravity, Urine 1.014  1.005 - 1.030    pH 6.0  5.0 - 8.0    Glucose, UA NEGATIVE  NEGATIVE (mg/dL)   Hgb urine dipstick NEGATIVE  NEGATIVE    Bilirubin Urine NEGATIVE  NEGATIVE    Ketones, ur NEGATIVE  NEGATIVE (mg/dL)   Protein, ur NEGATIVE  NEGATIVE (mg/dL)   Urobilinogen, UA 0.2  0.0 - 1.0 (mg/dL)   Nitrite NEGATIVE  NEGATIVE    Leukocytes, UA NEGATIVE  NEGATIVE   POCT I-STAT TROPONIN I      Component Value Range   Troponin i, poc 0.00  0.00 - 0.08 (ng/mL)   Comment 3           POCT I-STAT TROPONIN I      Component Value Range   Troponin i, poc 0.00  0.00 - 0.08 (ng/mL)   Comment 3            Dg Abd Acute W/chest  05/23/2011  *RADIOLOGY REPORT*  Clinical Data: Vomiting, nausea and abdominal pain.  ACUTE ABDOMEN SERIES (ABDOMEN 2 VIEW & CHEST 1 VIEW)  Comparison: None.  Findings: The lungs are well-aerated and clear.  There is no evidence of focal opacification, pleural effusion or pneumothorax. The cardiomediastinal silhouette is within normal limits.  The visualized bowel gas pattern is unremarkable.  Scattered fluid and air are seen within the colon; there is no evidence of small bowel dilatation to suggest obstruction.  A small amount of air and fluid are seen within the stomach.  No free intra-abdominal air is identified on the provided upright view.  No acute osseous abnormalities are seen; the sacroiliac joints are unremarkable in appearance.  IMPRESSION:  1.  Unremarkable bowel gas pattern; no free intra-abdominal air seen. 2.  No acute cardiopulmonary process identified.  Original Report Authenticated By: Tonia Ghent, M.D.     Date: 05/23/2011  Rate: 85  Rhythm: normal sinus rhythm  QRS Axis: left  Intervals: normal  ST/T Wave abnormalities: normal  Conduction Disutrbances:none  Narrative Interpretation:   Old EKG  Reviewed: none available     1. Nausea and vomiting   2. Atypical chest pain   3. Hypokalemia    New Prescriptions   POTASSIUM CHLORIDE SA (K-DUR,KLOR-CON) 20 MEQ TABLET    Take 1 tablet (20 mEq total) by mouth 2 (two) times daily.   PROMETHAZINE (PHENERGAN) 25 MG TABLET    Take 1 tablet (25 mg total) by mouth every 6 (six) hours as needed for nausea.   Plan discharge Devoria Albe, MD, FACEP    MDM          Ward Givens, MD 05/24/11 [id number]

## 2011-05-23 NOTE — ED Notes (Signed)
Patient transported to X-ray 

## 2011-05-24 LAB — URINALYSIS, ROUTINE W REFLEX MICROSCOPIC
Glucose, UA: NEGATIVE mg/dL
Leukocytes, UA: NEGATIVE
Protein, ur: NEGATIVE mg/dL
Specific Gravity, Urine: 1.014 (ref 1.005–1.030)

## 2011-05-24 LAB — COMPREHENSIVE METABOLIC PANEL
ALT: 38 U/L — ABNORMAL HIGH (ref 0–35)
AST: 20 U/L (ref 0–37)
Albumin: 3.6 g/dL (ref 3.5–5.2)
CO2: 24 mEq/L (ref 19–32)
Calcium: 8.9 mg/dL (ref 8.4–10.5)
Creatinine, Ser: 0.46 mg/dL — ABNORMAL LOW (ref 0.50–1.10)
GFR calc non Af Amer: >90 mL/min (ref >90)
Sodium: 139 mEq/L (ref 135–145)
Total Protein: 7.4 g/dL (ref 6.0–8.3)

## 2011-05-24 LAB — POCT I-STAT TROPONIN I

## 2011-05-24 MED ORDER — PROMETHAZINE HCL 25 MG PO TABS: 25.0000 mg | ORAL_TABLET | Freq: Four times a day (QID) | ORAL | Status: DC | PRN

## 2011-05-24 MED ORDER — POTASSIUM CHLORIDE CRYS ER 20 MEQ PO TBCR
40.0000 meq | EXTENDED_RELEASE_TABLET | Freq: Once | ORAL | Status: AC
  Administered 2011-05-24: 40 meq via ORAL
  Filled 2011-05-24: qty 2

## 2011-05-24 MED ORDER — POTASSIUM CHLORIDE CRYS ER 20 MEQ PO TBCR: 20.0000 meq | EXTENDED_RELEASE_TABLET | Freq: Two times a day (BID) | ORAL | Status: DC

## 2011-05-24 NOTE — ED Notes (Signed)
MD at bedside. 

## 2011-05-25 ENCOUNTER — Encounter (HOSPITAL_COMMUNITY): Payer: Self-pay | Admitting: *Deleted

## 2011-06-01 ENCOUNTER — Encounter: Payer: Self-pay | Admitting: Gastroenterology

## 2011-06-05 ENCOUNTER — Encounter: Payer: Self-pay | Admitting: *Deleted

## 2011-06-08 ENCOUNTER — Ambulatory Visit (INDEPENDENT_AMBULATORY_CARE_PROVIDER_SITE_OTHER): Payer: Self-pay | Admitting: Physician Assistant

## 2011-06-08 ENCOUNTER — Encounter: Payer: Self-pay | Admitting: Physician Assistant

## 2011-06-08 DIAGNOSIS — I10 Essential (primary) hypertension: Secondary | ICD-10-CM

## 2011-06-08 DIAGNOSIS — E119 Type 2 diabetes mellitus without complications: Secondary | ICD-10-CM

## 2011-06-08 DIAGNOSIS — K219 Gastro-esophageal reflux disease without esophagitis: Secondary | ICD-10-CM

## 2011-06-08 DIAGNOSIS — R079 Chest pain, unspecified: Secondary | ICD-10-CM

## 2011-06-08 NOTE — Patient Instructions (Signed)
Your physician has requested that you have en exercise stress myoview DX 786.50 CHEST PAIN. For further information please visit [email or web address] Please follow instruction sheet, as given.  Your physician recommends that you schedule a follow-up appointment in: 3-4 MONTHS WITH DR. Graciela Husbands  NO CHANGES TODAY

## 2011-06-08 NOTE — Progress Notes (Signed)
[address] [place], [place]  [address] Phone: [phone] Fax:  [phone]  Date:  06/08/2011   Name:  Tammy [name]       DOB:  Oct 25, 1955 MRN:  [id number]  PCP:  Dr. Norberto Sorenson at [facility] Primary Cardiologist:  Dr. Sherryl Manges    History of Present Illness: Tammy [name] is a 56 y.o. female who presents for evaluation of chest pain.    She has a history of minimal nonobstructive CAD by cardiac catheterization 5/04: ostial circumflex 25%, proximal RCA 30% with spasm and EF 65%.  Other history includes hypertension, diet-controlled diabetes, sleep apnea, asthma, depression and GERD.  She has seen Dr. Graciela Husbands in the past with bradycardia.  This was in the setting of beta blocker therapy.  Beta blocker was discontinued with resolution of her bradycardia.  Dr. Odessa Fleming plan was to see her back p.r.n.  Last echo 9/12: EF 50-55%, mild LAE.  She was admitted 04/2011 with altered mental status.  MRI of the brain was unremarkable and she underwent an EEG to assess for possible seizure disorder.  I am not aware of those results.  She was seen in the emergency room with nausea, vomiting and chest discomfort 05/23/11.  Cardiac markers were negative.  Potassium 3.2, creatinine 0.46, hemoglobin 12.9.  She has seen her PCP and followup.  It appears that she is being placed on PPI therapy and is undergoing evaluation for H. Pylori.  She is also being referred to gastroenterology.  She reports symptoms of chest tightness over the last 6 months.  Upon further questioning, she had similar symptoms prior to her cardiac catheterization 2004.  She has symptoms with exertion.  She had symptoms at rest.  She does not always get symptoms with exertion.  She sleeps with CPAP.  She denies PND or significant lower extremity edema.  She sometimes has some shortness of breath associated with her chest pain.  She also sometimes has left arm pain.  She also sometimes notes diaphoresis.  He  denies any associated nausea.  She denies syncope.  She denies any relation to meals.  She denies pleuritic symptoms.  Past Medical History  Diagnosis Date  . Asthma   . Diabetes mellitus   . MI, old   . Hypertension   . Acid reflux     Current Outpatient Prescriptions  Medication Sig Dispense Refill  . albuterol (PROVENTIL HFA;VENTOLIN HFA) 108 (90 BASE) MCG/ACT inhaler Inhale 2 puffs into the lungs every 6 (six) hours as needed. For shortness of breath.      Marland Kitchen aspirin EC 81 MG tablet Take 81 mg by mouth daily.      . cloNIDine (CATAPRES) 0.2 MG tablet Take 0.2 mg by mouth 2 (two) times daily.      . furosemide (LASIX) 40 MG tablet Take 40 mg by mouth daily.      Marland Kitchen gabapentin (NEURONTIN) 100 MG capsule Take 100 mg by mouth 3 (three) times daily.      Marland Kitchen lisinopril (PRINIVIL,ZESTRIL) 40 MG tablet Take 40 mg by mouth daily.      . pantoprazole (PROTONIX) 40 MG tablet Take 40 mg by mouth daily.      . potassium chloride SA (K-DUR,KLOR-CON) 20 MEQ tablet Take 1 tablet (20 mEq total) by mouth 2 (two) times daily.  10 tablet  0    Allergies: No Known Allergies  Social History:  Nonsmoker   ROS:  Please see the history of present illness.  She notes dysphagia as well as odynophagia.  She notes belching, water brash symptoms and indigestion.  She denies fevers, chills, melena, hematochezia.  All other systems reviewed and negative.   PHYSICAL EXAM: VS:  BP 136/83  Pulse 97  Ht 5\' 6"  (1.676 m)  Wt 244 lb (110.678 kg)  BMI 39.38 kg/m2 Well nourished, well developed, in no acute distress HEENT: normal Neck: no JVD Vascular: No carotid bruits Endocrine: No thyromegaly Cardiac:  normal S1, S2; RRR; no murmur Lungs:  clear to auscultation bilaterally, no wheezing, rhonchi or rales Abd: soft, nontender, no hepatomegaly Ext: no edema Skin: warm and dry Neuro:  CNs 2-12 intact, no focal abnormalities noted Psych: Normal affect  EKG:  Sinus rhythm, heart rate 97, leftward axis,  nonspecific ST-T wave changes  ASSESSMENT AND PLAN:  1. Chest pain  Atypical and typical features.  She had similar symptoms in 2004.  She had minimal CAD at cath in 2004 and does have Diabetes.   ECG is without acute changes.  She has GI workup pending.  Will arrange ETT-Myoview.  If neg, no further cardiac workup.  Will have her follow up with Dr. Sherryl Manges in 3-4 mos.   2. HYPERTENSION  Controlled.  Continue current therapy.    3. DIABETES MELLITUS, TYPE II  Consider statin therapy given diabetes.  Defer to primary care.   4. GERD  She remains on PPI and has GI appt pending.     Luna Glasgow, PA-C  3:42 PM 06/08/2011

## 2011-06-11 ENCOUNTER — Encounter (HOSPITAL_COMMUNITY): Payer: Self-pay | Admitting: Emergency Medicine

## 2011-06-11 ENCOUNTER — Emergency Department (HOSPITAL_COMMUNITY)
Admission: EM | Admit: 2011-06-11 | Discharge: 2011-06-11 | Disposition: A | Discharge status: Home / Self Care | Payer: Self-pay | Source: Home / Self Care

## 2011-06-11 DIAGNOSIS — M7712 Lateral epicondylitis, left elbow: Secondary | ICD-10-CM

## 2011-06-11 DIAGNOSIS — M7710 Lateral epicondylitis, unspecified elbow: Secondary | ICD-10-CM

## 2011-06-11 MED ORDER — MELOXICAM 7.5 MG PO TABS: 7.5000 mg | ORAL_TABLET | Freq: Every day | ORAL | Status: AC

## 2011-06-11 NOTE — ED Provider Notes (Signed)
History     CSN: [id number]  Arrival date & time 06/11/11  1513   None     Chief Complaint  Patient presents with  . Elbow Pain    (Consider location/radiation/quality/duration/timing/severity/associated sxs/prior treatment) HPI Comments: Lt elbow pain x over a month. No trauma. Nontender to touch, worse with movement. Reports swelling also. Has tried over the counter rubs without improvement. States she hasn't had it xrayed yet.   Past Medical History  Diagnosis Date  . Asthma   . Diabetes mellitus   . MI, old   . Hypertension   . Acid reflux     Past Surgical History  Procedure Date  . Abdominal hysterectomy   . Appendectomy     No family history on file.  History  Substance Use Topics  . Smoking status: Unknown If Ever Smoked  . Smokeless tobacco: Not on file  . Alcohol Use: No    OB History    Grav Para Term Preterm Abortions TAB SAB Ect Mult Living                  Review of Systems  Musculoskeletal: Positive for joint swelling.  Skin: Negative for color change and wound.  Neurological: Negative for numbness.    Allergies  Review of patient's allergies indicates no known allergies.  Home Medications   Current Outpatient Rx  Name Route Sig Dispense Refill  . BUPROPION HCL 100 MG PO TABS Oral Take 100 mg by mouth 2 (two) times daily.    . DESVENLAFAXINE SUCCINATE ER 50 MG PO TB24 Oral Take 50 mg by mouth 2 (two) times daily.    . ALBUTEROL SULFATE HFA 108 (90 BASE) MCG/ACT IN AERS Inhalation Inhale 2 puffs into the lungs every 6 (six) hours as needed. For shortness of breath.    . ASPIRIN EC 81 MG PO TBEC Oral Take 81 mg by mouth daily.    Marland Kitchen CLONIDINE HCL 0.2 MG PO TABS Oral Take 0.2 mg by mouth 2 (two) times daily.    . FUROSEMIDE 40 MG PO TABS Oral Take 40 mg by mouth daily.    Marland Kitchen GABAPENTIN 100 MG PO CAPS Oral Take 100 mg by mouth 3 (three) times daily.    Marland Kitchen LISINOPRIL 40 MG PO TABS Oral Take 40 mg by mouth daily.    . MELOXICAM 7.5 MG PO TABS  Oral Take 1 tablet (7.5 mg total) by mouth daily. 30 tablet 0  . PANTOPRAZOLE SODIUM 40 MG PO TBEC Oral Take 40 mg by mouth daily.    Marland Kitchen POTASSIUM CHLORIDE CRYS ER 20 MEQ PO TBCR Oral Take 1 tablet (20 mEq total) by mouth 2 (two) times daily. 10 tablet 0    BP 164/91  Pulse 82  Temp(Src) 98.8 F (37.1 C) (Oral)  Resp 18  SpO2 97%  Physical Exam  Nursing note and vitals reviewed. Constitutional: She appears well-developed and well-nourished. No distress.  Cardiovascular:  Pulses:      Radial pulses are 2+ on the right side, and 2+ on the left side.  Musculoskeletal:       Left forearm: She exhibits bony tenderness (lateral epicondyle). She exhibits no swelling, no edema and no deformity.  Neurological: She has normal strength.  Reflex Scores:      Bicep reflexes are 2+ on the right side and 2+ on the left side. Skin: Skin is warm and dry.  Psychiatric: She has a normal mood and affect.    ED Course  Procedures (  including critical care time)  Labs Reviewed - No data to display No results found.   1. Lateral epicondylitis of left elbow       MDM          Melody Comas, PA 06/11/11 1655

## 2011-06-11 NOTE — ED Notes (Signed)
Left elbow pain , onset 3 weeks ago.  No known injury

## 2011-06-11 NOTE — ED Provider Notes (Signed)
Medical screening examination/treatment/procedure(s) were performed by resident physician or non-physician practitioner and as supervising physician I was immediately available for consultation/collaboration.   Dulcinea Kinser DOUGLAS MD.    Antoinette Borgwardt D Ivie Maese, MD 06/11/11 2115 

## 2011-06-11 NOTE — Discharge Instructions (Signed)
Purchase a tennis elbow strap at the store. Wear this during daytime hours. Ice your elbow at the area of discomfort 3-4 times a day for 15-20 minutes, or more often as needed for discomfort. Follow up with Dr Clelia Croft if symptoms persist.

## 2011-06-15 ENCOUNTER — Ambulatory Visit (HOSPITAL_COMMUNITY): Payer: Self-pay | Attending: Internal Medicine | Admitting: Radiology

## 2011-06-15 DIAGNOSIS — R0602 Shortness of breath: Secondary | ICD-10-CM | POA: Insufficient documentation

## 2011-06-15 DIAGNOSIS — R079 Chest pain, unspecified: Secondary | ICD-10-CM | POA: Insufficient documentation

## 2011-06-15 DIAGNOSIS — R55 Syncope and collapse: Secondary | ICD-10-CM | POA: Insufficient documentation

## 2011-06-15 DIAGNOSIS — R61 Generalized hyperhidrosis: Secondary | ICD-10-CM | POA: Insufficient documentation

## 2011-06-15 DIAGNOSIS — E119 Type 2 diabetes mellitus without complications: Secondary | ICD-10-CM | POA: Insufficient documentation

## 2011-06-15 DIAGNOSIS — R002 Palpitations: Secondary | ICD-10-CM | POA: Insufficient documentation

## 2011-06-15 DIAGNOSIS — J45909 Unspecified asthma, uncomplicated: Secondary | ICD-10-CM | POA: Insufficient documentation

## 2011-06-15 DIAGNOSIS — R5381 Other malaise: Secondary | ICD-10-CM | POA: Insufficient documentation

## 2011-06-15 DIAGNOSIS — I10 Essential (primary) hypertension: Secondary | ICD-10-CM | POA: Insufficient documentation

## 2011-06-15 MED ORDER — TECHNETIUM TC 99M TETROFOSMIN IV KIT
33.0000 | PACK | Freq: Once | INTRAVENOUS | Status: AC | PRN
  Administered 2011-06-15: 33 via INTRAVENOUS

## 2011-06-15 NOTE — Progress Notes (Signed)
[facility] SITE 3 NUCLEAR MED [address] [place] [address] [phone]  Cardiology Nuclear Med Study  Tammy [name] is a 56 y.o. female     MRN : [id number]     DOB: [birth year]  Procedure Date: 06/15/2011  Nuclear Med Background Indication for Stress Test:  Evaluation for Ischemia and 05/23/11 Post Hospital: Chest pain, (-) enzymes History:  Asthma, 07/2002: Heart Cath: minimal N/O Dz EF: 65%, 11/2010  ECHO: EF:50-55% Cardiac Risk Factors: Hypertension and NIDDM  Symptoms:  Chest Pain, Chest Tightness, Diaphoresis, Fatigue, Palpitations, SOB and Syncope   Nuclear Pre-Procedure Caffeine/Decaff Intake:  None NPO After: 7:00pm   Lungs:  clear O2 Sat: 96% on room air. IV 0.9% NS with Angio Cath:  20g  IV Site: R Antecubital  IV Started by:  Stanton Kidney, EMT-P  Chest Size (in):  42 Cup Size: DD  Height: 5\' 6"  (1.676 m)  Weight:  247 lb (112.038 kg)  BMI:  Body mass index is 39.87 kg/(m^2). Tech Comments:  Meds were taken as directed  The patient had never walked on a treadmill before, so I let her walk for 2 minutes slow to get adjusted.    Nuclear Med Study 1 or 2 day study: 2 day  Stress Test Type:  Stress  Reading MD: Olga Millers, MD  Order Authorizing Provider:  S.Klein MD / S.Alben Spittle PA.  Resting Radionuclide: Technetium 63m Tetrofosmin  Resting Radionuclide Dose: 32.5 mCi on 06/16/11   Stress Radionuclide:  Technetium 57m Tetrofosmin  Stress Radionuclide Dose: 33.0 mCi on 06/15/11           Stress Protocol Rest HR: 68 Stress HR: 153  Rest BP: 127/89 Stress BP: 171/86  Exercise Time (min): 2:59 METS: 4.6   Predicted Max HR: 166 bpm % Max HR: 92.17 bpm Rate Pressure Product: 42595   Dose of Adenosine (mg):  n/a Dose of Lexiscan: n/a mg  Dose of Atropine (mg): n/a Dose of Dobutamine: n/a mcg/kg/min (at max HR)  Stress Test Technologist: Milana Na, EMT-P  Nuclear Technologist:  Doyne Keel, CNMT     Rest Procedure:  Myocardial perfusion  imaging was performed at rest 45 minutes following the intravenous administration of Technetium 41m Tetrofosmin. Rest ECG: NSR - Normal EKG  Stress Procedure:  The patient performed treadmill exercise using a Bruce  Protocol for 2:59 minutes. The patient stopped due to fatigue, sob, and denied any chest pain.  There were non specific ST-T wave changes and rare pvcs.  Technetium 57m Tetrofosmin was injected at peak exercise and myocardial perfusion imaging was performed after a brief delay. Stress ECG: No significant ST segment change suggestive of ischemia.  QPS Raw Data Images:  There is a breast shadow. Stress Images:  There is decreased uptake in the anterior wall. Rest Images:  There is decreased uptake in the anterior wall, less prominent compared to the stress images. Subtraction (SDS):  These findings are consistent with breast attenuation and mild anterior ischemia. Transient Ischemic Dilatation (Normal <1.22):  0.76 Lung/Heart Ratio (Normal <0.45):  0.22  Quantitative Gated Spect Images QGS EDV:  78 ml QGS ESV:  26 ml  Impression Exercise Capacity:  Poor exercise capacity. BP Response:  Normal blood pressure response. Clinical Symptoms:  There is chest pain. ECG Impression:  No significant ST segment change suggestive of ischemia. Comparison with Prior Nuclear Study: No images to compare  Overall Impression:  Abnormal stress nuclear study with a small, moderate intensity, partially reversible anterior defect  consistent with breast attenuation and mild anterior ischemia.  LV Ejection Fraction: 66%.  LV Wall Motion:  NL LV Function; NL Wall Motion    Olga Millers

## 2011-06-16 ENCOUNTER — Ambulatory Visit (HOSPITAL_COMMUNITY): Payer: Self-pay | Attending: Cardiology

## 2011-06-16 DIAGNOSIS — R0989 Other specified symptoms and signs involving the circulatory and respiratory systems: Secondary | ICD-10-CM

## 2011-06-16 MED ORDER — TECHNETIUM TC 99M TETROFOSMIN IV KIT
33.0000 | PACK | Freq: Once | INTRAVENOUS | Status: AC | PRN
  Administered 2011-06-16: 33 via INTRAVENOUS

## 2011-06-17 ENCOUNTER — Telehealth: Payer: Self-pay | Admitting: *Deleted

## 2011-06-17 MED ORDER — NITROGLYCERIN 0.4 MG SL SUBL: 0.4000 mg | SUBLINGUAL_TABLET | SUBLINGUAL | Status: DC | PRN

## 2011-06-17 NOTE — Telephone Encounter (Signed)
pt notified of stress test results and recommendation to go to ED if worse b4 appt 06/25/11 w/SW same day SK in office, pt educated on how, when to use NTG, rx NTG sent in today. [name]

## 2011-06-17 NOTE — Telephone Encounter (Signed)
Message copied by Tarri Fuller on Wed Jun 17, 2011  2:15 PM ------      Message from: [place], [place] T      Created: Wed Jun 17, 2011  1:20 PM       Stress test abnormal.      She needs follow up in the office in the next 1-2 weeks to discuss.      Make sure she has NTG prn Rx.      If there is no reason why she cannot take an ASA, have her take ASA 81 mg QD      If she has recurrent pain/worsening pain, go to the ED.      She saw Dr. Sherryl Manges once in the past.  Can be on a day he is in the office.  Ok if nothing available to see me on day he is not here.      Tereso Newcomer, PA-C  1:20 PM 06/17/2011

## 2011-06-24 ENCOUNTER — Telehealth: Payer: Self-pay | Admitting: Internal Medicine

## 2011-06-24 ENCOUNTER — Other Ambulatory Visit: Payer: Self-pay | Admitting: Gastroenterology

## 2011-06-24 ENCOUNTER — Ambulatory Visit (INDEPENDENT_AMBULATORY_CARE_PROVIDER_SITE_OTHER): Payer: Self-pay | Admitting: Gastroenterology

## 2011-06-24 ENCOUNTER — Telehealth: Payer: Self-pay | Admitting: Gastroenterology

## 2011-06-24 ENCOUNTER — Encounter: Payer: Self-pay | Admitting: Gastroenterology

## 2011-06-24 VITALS — BP 130/70 | HR 68 | Ht 66.0 in | Wt 243.4 lb

## 2011-06-24 DIAGNOSIS — R1310 Dysphagia, unspecified: Secondary | ICD-10-CM | POA: Insufficient documentation

## 2011-06-24 DIAGNOSIS — Z800 Family history of malignant neoplasm of digestive organs: Secondary | ICD-10-CM

## 2011-06-24 DIAGNOSIS — K219 Gastro-esophageal reflux disease without esophagitis: Secondary | ICD-10-CM

## 2011-06-24 MED ORDER — PEG-KCL-NACL-NASULF-NA ASC-C 100 G PO SOLR: 1.0000 | Freq: Once | ORAL | Status: DC

## 2011-06-24 MED ORDER — DEXLANSOPRAZOLE 60 MG PO CPDR: 60.0000 mg | DELAYED_RELEASE_CAPSULE | Freq: Every day | ORAL | Status: DC

## 2011-06-24 NOTE — Patient Instructions (Addendum)
You have been scheduled for a colonoscopy with propofol and an Endoscopy W/Dil. Please follow written instructions given to you at your visit today.  Please pick up your prep kit at the pharmacy within the next 1-3 days.  We have sent the following medications to your pharmacy for you to pick up at your convenience: Moviprep  You have been given samples of Dexilant

## 2011-06-24 NOTE — Assessment & Plan Note (Signed)
Plan upper endoscopy to rule out an esophageal stricture 

## 2011-06-24 NOTE — Assessment & Plan Note (Signed)
Plan screening colonoscopy 

## 2011-06-24 NOTE — Progress Notes (Signed)
History of Present Illness: Tammy [name] is a 23 at [place] female referred at the request of Dr. Clelia Croft for evaluation of nausea and pyrosis.  Despite taking Protonix daily she has frequent bouts of nausea and regurgitant  of gastric contents. This is accompanied by pyrosis. She also complains of mild dysphagia to solids. She takes Mobic.  Tammy [name] also complains of erratic bowels characterized by alternating episodes of loose stools and hard stools. There is no history of bleeding. Family history is pertinent for her brother who had colon cancer at 27.  Aunt also had colon cancer.    Past Medical History  Diagnosis Date  . Asthma   . Diabetes mellitus   . MI, old   . Hypertension   . Acid reflux   . Arthritis   . Chronic headaches   . Depression    Past Surgical History  Procedure Date  . Abdominal hysterectomy   . Appendectomy    family history includes Colon cancer in her brother and paternal aunt; Heart disease in her brother and father; and Stomach cancer in her paternal grandmother. Current Outpatient Prescriptions  Medication Sig Dispense Refill  . albuterol (PROVENTIL HFA;VENTOLIN HFA) 108 (90 BASE) MCG/ACT inhaler Inhale 2 puffs into the lungs every 6 (six) hours as needed. For shortness of breath.      Marland Kitchen aspirin EC 81 MG tablet Take 81 mg by mouth daily.      Marland Kitchen buPROPion (WELLBUTRIN) 100 MG tablet Take 100 mg by mouth 2 (two) times daily.      . cloNIDine (CATAPRES) 0.2 MG tablet Take 0.2 mg by mouth 2 (two) times daily.      Marland Kitchen desvenlafaxine (PRISTIQ) 50 MG 24 hr tablet Take 50 mg by mouth 2 (two) times daily.      . furosemide (LASIX) 40 MG tablet Take 40 mg by mouth daily.      Marland Kitchen gabapentin (NEURONTIN) 100 MG capsule Take 100 mg by mouth 3 (three) times daily.      Marland Kitchen lisinopril (PRINIVIL,ZESTRIL) 40 MG tablet Take 40 mg by mouth daily.      . meloxicam (MOBIC) 7.5 MG tablet Take 1 tablet (7.5 mg total) by mouth daily.  30 tablet  0  . nitroGLYCERIN (NITROSTAT) 0.4 MG SL  tablet Place 1 tablet (0.4 mg total) under the tongue every 5 (five) minutes as needed for chest pain.  25 tablet  3  . pantoprazole (PROTONIX) 40 MG tablet Take 40 mg by mouth daily.      . potassium chloride SA (K-DUR,KLOR-CON) 20 MEQ tablet Take 1 tablet (20 mEq total) by mouth 2 (two) times daily.  10 tablet  0  . promethazine (PHENERGAN) 12.5 MG tablet Take 1 tablet (12.5 mg total) by mouth every 6 (six) hours as needed for nausea.  30 tablet  0  . promethazine (PHENERGAN) 25 MG tablet Take 1 tablet (25 mg total) by mouth every 6 (six) hours as needed for nausea.  10 tablet  0   Allergies as of 06/24/2011  . (No Known Allergies)    reports that she has quit smoking. She has never used smokeless tobacco. She reports that she does not drink alcohol or use illicit drugs.     Review of Systems: She complains of arthritic and back pain. She suffers from depression. She has frequent muscle cramps and complaints of insomnia. Pertinent positive and negative review of systems were noted in the above HPI section. All other review of systems were otherwise  negative.  Vital signs were reviewed in today's medical record Physical Exam: General: Well developed , well nourished, no acute distress Head: Normocephalic and atraumatic Eyes:  sclerae anicteric, EOMI Ears: Normal auditory acuity Mouth: No deformity or lesions Neck: Supple, no masses or thyromegaly Lungs: Clear throughout to auscultation Heart: Regular rate and rhythm; no murmurs, rubs or bruits Abdomen: Soft, non tender and non distended. No masses, hepatosplenomegaly or hernias noted. Normal Bowel sounds. There is no succussion splash Rectal:deferred Musculoskeletal: Symmetrical with no gross deformities  Skin: No lesions on visible extremities Pulses:  Normal pulses noted Extremities: No clubbing, cyanosis, edema or deformities noted Neurological: Alert oriented x 4, grossly nonfocal Cervical Nodes:  No significant cervical  adenopathy Inguinal Nodes: No significant inguinal adenopathy Psychological:  Alert and cooperative. Normal mood and affect

## 2011-06-24 NOTE — Assessment & Plan Note (Addendum)
She is symptomatic despite taking daily Protonix. Mobic may be contributing. Gastroparesis is also a concern.  Recommendations #1 trial of Dexilant  60 mg daily #2 gastric emptying scan if she's not improved with dexilant #3 to consider holding Mobic

## 2011-06-24 NOTE — Telephone Encounter (Signed)
Resent pt's prep for colonoscopy to [facility].

## 2011-06-25 ENCOUNTER — Telehealth: Payer: Self-pay | Admitting: Gastroenterology

## 2011-06-25 ENCOUNTER — Encounter: Payer: Self-pay | Admitting: Physician Assistant

## 2011-06-25 ENCOUNTER — Ambulatory Visit (INDEPENDENT_AMBULATORY_CARE_PROVIDER_SITE_OTHER): Payer: Self-pay | Admitting: Physician Assistant

## 2011-06-25 ENCOUNTER — Encounter: Payer: Self-pay | Admitting: *Deleted

## 2011-06-25 VITALS — BP 130/80 | HR 99 | Ht 66.0 in | Wt 240.0 lb

## 2011-06-25 DIAGNOSIS — R9430 Abnormal result of cardiovascular function study, unspecified: Secondary | ICD-10-CM

## 2011-06-25 DIAGNOSIS — R079 Chest pain, unspecified: Secondary | ICD-10-CM

## 2011-06-25 DIAGNOSIS — I2510 Atherosclerotic heart disease of native coronary artery without angina pectoris: Secondary | ICD-10-CM

## 2011-06-25 MED ORDER — METOPROLOL TARTRATE 25 MG PO TABS: 12.5000 mg | ORAL_TABLET | Freq: Two times a day (BID) | ORAL | Status: DC

## 2011-06-25 NOTE — Telephone Encounter (Signed)
Pt requested that rx be sent to [facility] pharmacy. Called [facility] and they do not carry Movi-prep. Pt notified that the rx had been sent to [facility]. Pt aware.

## 2011-06-25 NOTE — Progress Notes (Signed)
[address] [place], [place]  [address] Phone: [phone] Fax:  [phone]  Date:  06/25/2011   Name:  Tammy [name]       DOB:  1955/12/24 MRN:  [id number]  PCP:  Dr. Norberto Sorenson at [facility] Primary Cardiologist:  Dr. Sherryl Manges    History of Present Illness: Tammy [name] is a 56 y.o. female who presents for follow up of an abnormal stress test.    She has a history of minimal nonobstructive CAD by cardiac catheterization 5/04: ostial circumflex 25%, proximal RCA 30% with spasm and EF 65%.  Other history includes hypertension, diet-controlled diabetes, sleep apnea, asthma, depression and GERD.  She was seen Dr. Graciela Husbands in the past with bradycardia in the setting of beta blocker therapy.  Beta blocker was discontinued with resolution of her bradycardia.  Dr. Odessa Fleming plan was to see her back p.r.n.  Last echo 9/12: EF 50-55%, mild LAE.  She was admitted 04/2011 with altered mental status.  MRI of the brain was unremarkable and she underwent an EEG to assess for possible seizure disorder.  I am not aware of those results.  She was seen in the emergency room with nausea, vomiting and chest discomfort 05/23/11.  Cardiac markers were negative.  She was placed on PPI therapy and is undergoing evaluation for H. Pylori and is also being referred to gastroenterology.  I saw her 3/18 in follow up.  She continued to have chest discomfort and I arranged an ETT-Myoview.  This was done 06/15/11:  Abnormal stress nuclear study with a small, moderate intensity, partially reversible anterior defect consistent with breast attenuation and mild anterior ischemia.  LV Ejection Fraction: 66%. LV Wall Motion: normal.  She was brought in today to discuss the results.  She notes symptoms of exertional chest tightness and assoc dyspnea.  She is not sure how long this has gone on.  She notes some radiation to her arms and legs.  Also notes nausea and diaphoresis.  No syncope.  No orthopnea,  PND or edema.  Saw Dr. Arlyce Dice yesterday and plan is for EGD/colo.     Past Medical History  Diagnosis Date  . Asthma   . Diabetes mellitus   . CAD (coronary artery disease)     catheterization 5/04: ostial circumflex 25%, proximal RCA 30% with spasm and EF 65%.;  Myoview 3/13: mild ant ischemia, EF 66%  . Hypertension   . GERD (gastroesophageal reflux disease)   . Arthritis   . Chronic headaches   . Depression   . Bradycardia     2/2 beta blockers  . Sleep apnea     Current Outpatient Prescriptions  Medication Sig Dispense Refill  . albuterol (PROVENTIL HFA;VENTOLIN HFA) 108 (90 BASE) MCG/ACT inhaler Inhale 2 puffs into the lungs every 6 (six) hours as needed. For shortness of breath.      Marland Kitchen aspirin EC 81 MG tablet Take 81 mg by mouth daily.      Marland Kitchen buPROPion (WELLBUTRIN) 100 MG tablet Take 100 mg by mouth 2 (two) times daily.      . cloNIDine (CATAPRES) 0.2 MG tablet Take 0.2 mg by mouth 2 (two) times daily.      Marland Kitchen desvenlafaxine (PRISTIQ) 50 MG 24 hr tablet Take 50 mg by mouth 2 (two) times daily.      Marland Kitchen dexlansoprazole (DEXILANT) 60 MG capsule Take 1 capsule (60 mg total) by mouth daily.  60 capsule  0  . furosemide (LASIX) 40  MG tablet Take 40 mg by mouth daily.      Marland Kitchen gabapentin (NEURONTIN) 100 MG capsule Take 100 mg by mouth 3 (three) times daily.      Marland Kitchen lisinopril (PRINIVIL,ZESTRIL) 40 MG tablet Take 40 mg by mouth daily.      . meloxicam (MOBIC) 7.5 MG tablet Take 1 tablet (7.5 mg total) by mouth daily.  30 tablet  0  . nitroGLYCERIN (NITROSTAT) 0.4 MG SL tablet Place 1 tablet (0.4 mg total) under the tongue every 5 (five) minutes as needed for chest pain.  25 tablet  3  . pantoprazole (PROTONIX) 40 MG tablet Take 40 mg by mouth daily.      . peg 3350 powder (MOVIPREP) SOLR Take 1 kit (100 g total) by mouth once.  1 kit  0  . potassium chloride SA (K-DUR,KLOR-CON) 20 MEQ tablet Take 1 tablet (20 mEq total) by mouth 2 (two) times daily.  10 tablet  0  . promethazine  (PHENERGAN) 12.5 MG tablet Take 1 tablet (12.5 mg total) by mouth every 6 (six) hours as needed for nausea.  30 tablet  0  . promethazine (PHENERGAN) 25 MG tablet Take 1 tablet (25 mg total) by mouth every 6 (six) hours as needed for nausea.  10 tablet  0    Allergies: No Known Allergies  Social History:  Nonsmoker  Family History  Problem Relation Age of Onset  . Colon cancer Brother   . Colon cancer Paternal Aunt   . Stomach cancer Paternal Grandmother   . Heart disease Father   . Heart disease Brother     x3      ROS:  Please see the history of present illness.  All other systems reviewed and negative.   PHYSICAL EXAM: VS:  BP 130/80  Pulse 99  Ht 5\' 6"  (1.676 m)  Wt 240 lb (108.863 kg)  BMI 38.74 kg/m2 Well nourished, well developed, in no acute distress HEENT: normal Neck: no JVD Vascular: No carotid bruits; no FA bruits Endocrine: No thyromegaly Cardiac:  normal S1, S2; RRR; no murmur Lungs:  clear to auscultation bilaterally, no wheezing, rhonchi or rales Abd: soft, nontender, no hepatomegaly Ext: no edema Skin: warm and dry Neuro:  CNs 2-12 intact, no focal abnormalities noted Psych: Normal affect  EKG:  NSR, HR 99, normal axis, PRWP, no acute changes, no change from prior  ASSESSMENT AND PLAN:  1.  Chest Pain She has symptoms that sound c/w exertional angina.  She also has GERD and her symptoms may be caused by this.  However, she has a h/o nonobs CAD by cath in 2004, diabetes and an abnormal myoview with mild anterior ischemia.  I have recommended cardiac cath to her today.  Discussed with Dr. Sherryl Manges who agrees.  Risks and benefits of cardiac catheterization have been discussed with the patient.  These include bleeding, infection, kidney damage, stroke, heart attack, death.  The patient understands these risks and is willing to proceed.  I have suggested she have this done prior to her EGD/colo.  She will continue ASA, NTG prn.  Start low dose metoprolol  12.5 mg bid.  Of note, she had symptomatic brady on higher dose beta blockers in the past.  If obstructive CAD noted, she will need statin therapy initiated.    2.  Hypertension Controlled.  Continue current therapy.   3.  GERD Followed by Dr. Arlyce Dice and EGD is planned.  Signed, Tereso Newcomer, PA-C  2:44 PM 06/25/2011

## 2011-06-25 NOTE — Patient Instructions (Signed)
Your physician has recommended you make the following change in your medication: START Metoprolol 12.5 mg twice daily Your physician has requested that you have a cardiac catheterization. Cardiac catheterization is used to diagnose and/or treat various heart conditions. Doctors may recommend this procedure for a number of different reasons. The most common reason is to evaluate chest pain. Chest pain can be a symptom of coronary artery disease (CAD), and cardiac catheterization can show whether plaque is narrowing or blocking your heart's arteries. This procedure is also used to evaluate the valves, as well as measure the blood flow and oxygen levels in different parts of your heart. For further information please visit [email or web address] Please follow instruction sheet, as given.

## 2011-06-25 NOTE — H&P (Signed)
History and Physical Date:  06/25/2011    Name:  Tammy [name]                                                        DOB:  December 18, 1955 MRN:  [id number]  PCP:  Dr. Norberto Sorenson at [facility] Primary Cardiologist:  Dr. Sherryl Manges      History of Present Illness: Tammy [name] is a 56 y.o. female who presents for follow up of an abnormal stress test.    She has a history of minimal nonobstructive CAD by cardiac catheterization 5/04: ostial circumflex 25%, proximal RCA 30% with spasm and EF 65%.  Other history includes hypertension, diet-controlled diabetes, sleep apnea, asthma, depression and GERD.  She was seen Dr. Graciela Husbands in the past with bradycardia in the setting of beta blocker therapy.  Beta blocker was discontinued with resolution of her bradycardia.  Dr. Odessa Fleming plan was to see her back p.r.n.  Last echo 9/12: EF 50-55%, mild LAE.  She was admitted 04/2011 with altered mental status.  MRI of the brain was unremarkable and she underwent an EEG to assess for possible seizure disorder.  I am not aware of those results.  She was seen in the emergency room with nausea, vomiting and chest discomfort 05/23/11.  Cardiac markers were negative.  She was placed on PPI therapy and is undergoing evaluation for H. Pylori and is also being referred to gastroenterology.  I saw her 3/18 in follow up.  She continued to have chest discomfort and I arranged an ETT-Myoview.  This was done 06/15/11:  Abnormal stress nuclear study with a small, moderate intensity, partially reversible anterior defect consistent with breast attenuation and mild anterior ischemia.  LV Ejection Fraction: 66%. LV Wall Motion: normal.  She was brought in today to discuss the results.  She notes symptoms of exertional chest tightness and assoc dyspnea.  She is not sure how long this has gone on.  She notes some radiation to her arms and legs.  Also notes nausea and diaphoresis.  No syncope.  No orthopnea, PND or edema.  Saw Dr. Arlyce Dice yesterday  and plan is for EGD/colo.       Past Medical History   Diagnosis  Date   .  Asthma     .  Diabetes mellitus     .  CAD (coronary artery disease)         catheterization 5/04: ostial circumflex 25%, proximal RCA 30% with spasm and EF 65%.;  Myoview 3/13: mild ant ischemia, EF 66%   .  Hypertension     .  GERD (gastroesophageal reflux disease)     .  Arthritis     .  Chronic headaches     .  Depression     .  Bradycardia         2/2 beta blockers   .  Sleep apnea         Current Outpatient Prescriptions   Medication  Sig  Dispense  Refill   .  albuterol (PROVENTIL HFA;VENTOLIN HFA) 108 (90 BASE) MCG/ACT inhaler  Inhale 2 puffs into the lungs every 6 (six) hours as needed. For shortness of breath.         Marland Kitchen  aspirin EC 81 MG tablet  Take 81 mg by mouth daily.         Marland Kitchen  buPROPion (WELLBUTRIN) 100 MG tablet  Take 100 mg by mouth 2 (two) times daily.         .  cloNIDine (CATAPRES) 0.2 MG tablet  Take 0.2 mg by mouth 2 (two) times daily.         Marland Kitchen  desvenlafaxine (PRISTIQ) 50 MG 24 hr tablet  Take 50 mg by mouth 2 (two) times daily.         Marland Kitchen  dexlansoprazole (DEXILANT) 60 MG capsule  Take 1 capsule (60 mg total) by mouth daily.   60 capsule   0   .  furosemide (LASIX) 40 MG tablet  Take 40 mg by mouth daily.         Marland Kitchen  gabapentin (NEURONTIN) 100 MG capsule  Take 100 mg by mouth 3 (three) times daily.         Marland Kitchen  lisinopril (PRINIVIL,ZESTRIL) 40 MG tablet  Take 40 mg by mouth daily.         .  meloxicam (MOBIC) 7.5 MG tablet  Take 1 tablet (7.5 mg total) by mouth daily.   30 tablet   0   .  nitroGLYCERIN (NITROSTAT) 0.4 MG SL tablet  Place 1 tablet (0.4 mg total) under the tongue every 5 (five) minutes as needed for chest pain.   25 tablet   3   .  pantoprazole (PROTONIX) 40 MG tablet  Take 40 mg by mouth daily.         .  peg 3350 powder (MOVIPREP) SOLR  Take 1 kit (100 g total) by mouth once.   1 kit   0   .  potassium chloride SA (K-DUR,KLOR-CON) 20 MEQ tablet  Take 1 tablet (20 mEq  total) by mouth 2 (two) times daily.   10 tablet   0   .  promethazine (PHENERGAN) 12.5 MG tablet  Take 1 tablet (12.5 mg total) by mouth every 6 (six) hours as needed for nausea.   30 tablet   0   .  promethazine (PHENERGAN) 25 MG tablet  Take 1 tablet (25 mg total) by mouth every 6 (six) hours as needed for nausea.   10 tablet   0      Allergies: No Known Allergies  Social History:  Nonsmoker    Family History   Problem  Relation  Age of Onset   .  Colon cancer  Brother     .  Colon cancer  Paternal Aunt     .  Stomach cancer  Paternal Grandmother     .  Heart disease  Father     .  Heart disease  Brother         x3                ROS:  Please see the history of present illness.  All other systems reviewed and negative.    PHYSICAL EXAM: VS:  BP 130/80  Pulse 99  Ht 5\' 6"  (1.676 m)  Wt 240 lb (108.863 kg)  BMI 38.74 kg/m2 Well nourished, well developed, in no acute distress  HEENT: normal  Neck: no JVD  Vascular: No carotid bruits; no FA bruits Endocrine: No thyromegaly Cardiac:  normal S1, S2; RRR; no murmur  Lungs:  clear to auscultation bilaterally, no wheezing, rhonchi or rales  Abd: soft, nontender, no hepatomegaly  Ext: no edema  Skin: warm and dry  Neuro:  CNs 2-12 intact, no focal abnormalities noted Psych: Normal affect  EKG:  NSR,  HR 99, normal axis, PRWP, no acute changes, no change from prior  ASSESSMENT AND PLAN:  1.  Chest Pain She has symptoms that sound c/w exertional angina.  She also has GERD and her symptoms may be caused by this.  However, she has a h/o nonobs CAD by cath in 2004, diabetes and an abnormal myoview with mild anterior ischemia.  I have recommended cardiac cath to her today.  Discussed with Dr. Sherryl Manges who agrees.  Risks and benefits of cardiac catheterization have been discussed with the patient.  These include bleeding, infection, kidney damage, stroke, heart attack, death.  The patient understands these risks and is willing to  proceed.  I have suggested she have this done prior to her EGD/colo.  She will continue ASA, NTG prn.  Start low dose metoprolol 12.5 mg bid.  Of note, she had symptomatic brady on higher dose beta blockers in the past.  If obstructive CAD noted, she will need statin therapy initiated.    2.  Hypertension Controlled.  Continue current therapy.   3.  GERD Followed by Dr. Arlyce Dice and EGD is planned.  Signed, Tereso Newcomer, PA-C  2:44 PM 06/25/2011

## 2011-06-26 ENCOUNTER — Telehealth: Payer: Self-pay | Admitting: *Deleted

## 2011-06-26 ENCOUNTER — Telehealth: Payer: Self-pay | Admitting: Physician Assistant

## 2011-06-26 LAB — BASIC METABOLIC PANEL
BUN: 14 mg/dL (ref 6–23)
CO2: 32 mEq/L (ref 19–32)
Chloride: 102 mEq/L (ref 96–112)
Creatinine, Ser: 0.7 mg/dL (ref 0.4–1.2)
Potassium: 4.5 mEq/L (ref 3.5–5.1)

## 2011-06-26 LAB — CBC WITH DIFFERENTIAL/PLATELET
Basophils Absolute: 0 10*3/uL (ref 0.0–0.1)
Basophils Relative: 0.5 % (ref 0.0–3.0)
Eosinophils Absolute: 0.2 10*3/uL (ref 0.0–0.7)
HCT: 40.6 % (ref 36.0–46.0)
Hemoglobin: 13.2 g/dL (ref 12.0–15.0)
Lymphocytes Relative: 42.3 % (ref 12.0–46.0)
Lymphs Abs: 3 10*3/uL (ref 0.7–4.0)
MCHC: 32.6 g/dL (ref 30.0–36.0)
Monocytes Relative: 5.4 % (ref 3.0–12.0)
Neutro Abs: 3.5 10*3/uL (ref 1.4–7.7)
RBC: 4.6 Mil/uL (ref 3.87–5.11)
RDW: 16.7 % — ABNORMAL HIGH (ref 11.5–14.6)

## 2011-06-26 LAB — PROTIME-INR: Prothrombin Time: 11.8 s (ref 10.2–12.4)

## 2011-06-26 NOTE — Telephone Encounter (Signed)
Message copied by Tarri Fuller on Fri Jun 26, 2011  4:39 PM ------      Message from: [place], [place] T      Created: Fri Jun 26, 2011  2:13 PM       Please notify patient that the lab results are ok.      Tereso Newcomer, PA-C  2:13 PM 06/26/2011

## 2011-06-26 NOTE — Telephone Encounter (Signed)
New Problem:    Patient called in needing a note to excuse her son from school as he will be her transportation for her procedure on Monday 06/29/11.  Please call back.

## 2011-06-26 NOTE — Telephone Encounter (Signed)
pt notified of lab results today. Tammy [name]  

## 2011-06-26 NOTE — Telephone Encounter (Signed)
Letter written and left at the front desk for pick up per pt request.

## 2011-06-29 ENCOUNTER — Encounter (HOSPITAL_BASED_OUTPATIENT_CLINIC_OR_DEPARTMENT_OTHER)
Admission: RE | Disposition: A | Discharge status: Home / Self Care | Payer: Self-pay | Source: Ambulatory Visit | Attending: Cardiology

## 2011-06-29 ENCOUNTER — Inpatient Hospital Stay (HOSPITAL_BASED_OUTPATIENT_CLINIC_OR_DEPARTMENT_OTHER)
Admission: RE | Admit: 2011-06-29 | Discharge: 2011-06-29 | Disposition: A | Discharge status: Home / Self Care | Payer: Self-pay | Source: Ambulatory Visit | Attending: Cardiology | Admitting: Cardiology

## 2011-06-29 DIAGNOSIS — I2510 Atherosclerotic heart disease of native coronary artery without angina pectoris: Secondary | ICD-10-CM | POA: Insufficient documentation

## 2011-06-29 DIAGNOSIS — R079 Chest pain, unspecified: Secondary | ICD-10-CM | POA: Insufficient documentation

## 2011-06-29 DIAGNOSIS — F3289 Other specified depressive episodes: Secondary | ICD-10-CM | POA: Insufficient documentation

## 2011-06-29 DIAGNOSIS — R9430 Abnormal result of cardiovascular function study, unspecified: Secondary | ICD-10-CM

## 2011-06-29 DIAGNOSIS — G4730 Sleep apnea, unspecified: Secondary | ICD-10-CM | POA: Insufficient documentation

## 2011-06-29 DIAGNOSIS — K219 Gastro-esophageal reflux disease without esophagitis: Secondary | ICD-10-CM | POA: Insufficient documentation

## 2011-06-29 DIAGNOSIS — R9439 Abnormal result of other cardiovascular function study: Secondary | ICD-10-CM | POA: Insufficient documentation

## 2011-06-29 DIAGNOSIS — F329 Major depressive disorder, single episode, unspecified: Secondary | ICD-10-CM | POA: Insufficient documentation

## 2011-06-29 DIAGNOSIS — I10 Essential (primary) hypertension: Secondary | ICD-10-CM | POA: Insufficient documentation

## 2011-06-29 DIAGNOSIS — E119 Type 2 diabetes mellitus without complications: Secondary | ICD-10-CM | POA: Insufficient documentation

## 2011-06-29 DIAGNOSIS — J45909 Unspecified asthma, uncomplicated: Secondary | ICD-10-CM | POA: Insufficient documentation

## 2011-06-29 SURGERY — JV LEFT HEART CATHETERIZATION WITH CORONARY ANGIOGRAM
Anesthesia: Moderate Sedation

## 2011-06-29 MED ORDER — ONDANSETRON HCL 4 MG/2ML IJ SOLN: 4.0000 mg | Freq: Four times a day (QID) | INTRAMUSCULAR | Status: DC | PRN

## 2011-06-29 MED ORDER — SODIUM CHLORIDE 0.9 % IV SOLN: 250.0000 mL | INTRAVENOUS | Status: DC | PRN

## 2011-06-29 MED ORDER — ACETAMINOPHEN 325 MG PO TABS: 650.0000 mg | ORAL_TABLET | ORAL | Status: DC | PRN

## 2011-06-29 MED ORDER — ASPIRIN 81 MG PO CHEW
324.0000 mg | CHEWABLE_TABLET | ORAL | Status: AC
  Administered 2011-06-29: 243 mg via ORAL

## 2011-06-29 MED ORDER — SODIUM CHLORIDE 0.9 % IV SOLN: 1.0000 mL/kg/h | INTRAVENOUS | Status: DC

## 2011-06-29 MED ORDER — SODIUM CHLORIDE 0.9 % IJ SOLN: 3.0000 mL | Freq: Two times a day (BID) | INTRAMUSCULAR | Status: DC

## 2011-06-29 MED ORDER — SODIUM CHLORIDE 0.9 % IJ SOLN: 3.0000 mL | INTRAMUSCULAR | Status: DC | PRN

## 2011-06-29 MED ORDER — SODIUM CHLORIDE 0.9 % IV SOLN: INTRAVENOUS | Status: DC

## 2011-06-29 NOTE — Progress Notes (Signed)
Discharge instructions completed, ambulated to bathroom without bleeding from right groin site.  Discharged to home via wheelchair with son.

## 2011-06-29 NOTE — Progress Notes (Signed)
Bedrest begins @ 1000. 

## 2011-06-29 NOTE — Interval H&P Note (Signed)
History and Physical Interval Note:  06/29/2011 9:04 AM  Tammy [name]  has presented today for surgery, with the diagnosis of abn gxt  The various methods of treatment have been discussed with the patient and family. After consideration of risks, benefits and other options for treatment, the patient has consented to  Procedure(s) (LRB): JV LEFT HEART CATHETERIZATION WITH CORONARY ANGIOGRAM (N/A) as a surgical intervention .  The patients' history has been reviewed, patient examined, no change in status, stable for surgery.  I have reviewed the patients' chart and labs.  Questions were answered to the patient's satisfaction.     Tammy [name] [facility] 06/29/2011 9:04 AM

## 2011-06-29 NOTE — CV Procedure (Signed)
   Cardiac Catheterization Procedure Note  Name: Tammy [name] MRN: [id number] DOB: [birth year]  Procedure: Left Heart Cath, Selective Coronary Angiography, LV angiography  Indication: 56 year old black female with symptoms of chest pain. Stress Myoview study is abnormal showing an anterior wall defect.   Procedural details: The right groin was prepped, draped, and anesthetized with 1% lidocaine. Using modified Seldinger technique, a 5 French sheath was introduced into the right femoral artery. Standard Judkins catheters were used for coronary angiography and left ventriculography. Catheter exchanges were performed over a guidewire. There were no immediate procedural complications. The patient was transferred to the post catheterization recovery area for further monitoring.  Procedural Findings: Hemodynamics:  AO 93/52 with a mean of 69 mmHg LV 92/9 mmHg   Coronary angiography: Coronary dominance: right  Left mainstem: Normal.  Left anterior descending (LAD): Mild calcification in the proximal LAD. Minimal irregularities less than 10%.  Left circumflex (LCx): Normal.  Right coronary artery (RCA): [name] vessel. It is somewhat small in caliber throughout. To 30% narrowing in the proximal vessel.  Left ventriculography: Left ventricular systolic function is normal, LVEF is estimated at 55-65%, there is no significant mitral regurgitation   Final Conclusions:   1. Mild nonobstructive coronary disease. 2. Normal left ventricular function.  Recommendations: Continue risk factor modification. Proceed with GI evaluation.  Gildo Crisco [place] 06/29/2011, 9:30 AM

## 2011-07-01 NOTE — Telephone Encounter (Signed)
Nurse opened another phone note .Marland KitchenMarland KitchenMessage answered.

## 2011-07-07 ENCOUNTER — Telehealth: Payer: Self-pay | Admitting: Gastroenterology

## 2011-07-07 ENCOUNTER — Encounter: Payer: Self-pay | Admitting: Gastroenterology

## 2011-07-07 ENCOUNTER — Ambulatory Visit (AMBULATORY_SURGERY_CENTER): Payer: Self-pay | Admitting: Gastroenterology

## 2011-07-07 VITALS — BP 117/50 | HR 67 | Temp 98.8°F | Resp 18 | Ht 66.0 in | Wt 243.0 lb

## 2011-07-07 DIAGNOSIS — K219 Gastro-esophageal reflux disease without esophagitis: Secondary | ICD-10-CM

## 2011-07-07 DIAGNOSIS — Z800 Family history of malignant neoplasm of digestive organs: Secondary | ICD-10-CM

## 2011-07-07 DIAGNOSIS — K319 Disease of stomach and duodenum, unspecified: Secondary | ICD-10-CM

## 2011-07-07 DIAGNOSIS — R1310 Dysphagia, unspecified: Secondary | ICD-10-CM

## 2011-07-07 DIAGNOSIS — K222 Esophageal obstruction: Secondary | ICD-10-CM

## 2011-07-07 MED ORDER — SODIUM CHLORIDE 0.9 % IV SOLN: 500.0000 mL | INTRAVENOUS | Status: DC

## 2011-07-07 MED ORDER — DEXLANSOPRAZOLE 60 MG PO CPDR: 60.0000 mg | DELAYED_RELEASE_CAPSULE | Freq: Every day | ORAL | Status: DC

## 2011-07-07 NOTE — Telephone Encounter (Signed)
Pt states that her driver for her EGD today and her colon on Friday is her son. He is a [employer] representative in [employer] and needs a note for school for each day. Letters left up front for pick-up.

## 2011-07-07 NOTE — Patient Instructions (Addendum)
YOU HAD AN ENDOSCOPIC PROCEDURE TODAY AT THE [facility]: Refer to the procedure report that was given to you for any specific questions about what was found during the examination.  If the procedure report does not answer your questions, please call your gastroenterologist to clarify.  If you requested that your care partner not be given the details of your procedure findings, then the procedure report has been included in a sealed envelope for you to review at your convenience later.  YOU SHOULD EXPECT: Some feelings of bloating in the abdomen. Passage of more gas than usual.  Walking can help get rid of the air that was put into your GI tract during the procedure and reduce the bloating. If you had a lower endoscopy (such as a colonoscopy or flexible sigmoidoscopy) you may notice spotting of blood in your stool or on the toilet paper. If you underwent a bowel prep for your procedure, then you may not have a normal bowel movement for a few days.    Drink plenty of fluids but you should avoid alcoholic beverages for 24 hours.  ACTIVITY: Your care partner should take you home directly after the procedure.  You should plan to take it easy, moving slowly for the rest of the day.  You can resume normal activity the day after the procedure however you should NOT DRIVE or use heavy machinery for 24 hours (because of the sedation medicines used during the test).    SYMPTOMS TO REPORT IMMEDIATELY: A gastroenterologist can be reached at any hour.  During normal business hours, 8:30 AM to 5:00 PM Monday through Friday, call [phone].  After hours and on weekends, please call the GI answering service at [phone] who will take a message and have the physician on call contact you.   Following lower endoscopy (colonoscopy or flexible sigmoidoscopy):  Excessive amounts of blood in the stool  Significant tenderness or worsening of abdominal pains  Swelling of the abdomen that is new,  acute  Fever of 100F or higher  Following upper endoscopy (EGD)  Vomiting of blood or coffee ground material  New chest pain or pain under the shoulder blades  Painful or persistently difficult swallowing  New shortness of breath  Fever of 100F or higher  Black, tarry-looking stools  FOLLOW UP: If any biopsies were taken you will be contacted by phone or by letter within the next 1-3 weeks.  Call your gastroenterologist if you have not heard about the biopsies in 3 weeks.  Our staff will call the home number listed on your records the next business day following your procedure to check on you and address any questions or concerns that you may have at that time regarding the information given to you following your procedure. This is a courtesy call and so if there is no answer at the home number and we have not heard from you through the emergency physician on call, we will assume that you have returned to your regular daily activities without incident.  SIGNATURES/CONFIDENTIALITY: You and/or your care partner have signed paperwork which will be entered into your electronic medical record.  These signatures attest to the fact that that the information above on your After Visit Summary has been reviewed and is understood.  Full responsibility of the confidentiality of this discharge information lies with you and/or your care-partner.   HANDOUT ON GASTRITIS DILATION DIET TODAY,  NOTHING TO EAT OR DRINK UNTIL 3:40PM. AFTER 3:40 ONLY CLEAR LIQUIDS UNTIL 4:40  PM. AFTER 4:40 ONLY SOFT FOODS UNTIL THE MORNING.   RESUME REGULAR DIET TOMORROW  THE OFFICE WILL CALL YOU WITH AN APPOINTMENT FOR A GASTRIC EMPTYING STUDY.

## 2011-07-07 NOTE — Op Note (Signed)
[facility] [address] [place], [place]  [address]  ENDOSCOPY PROCEDURE REPORT  PATIENT:  Tammy [name], Tammy [name]  MR#:  [id number] BIRTHDATE:  1955-04-28, 56 yrs. old  GENDER:  female  ENDOSCOPIST:  Barbette Hair. Arlyce Dice, MD Referred by:  PROCEDURE DATE:  07/07/2011 PROCEDURE:  EGD with biopsy, 308[address] ASA CLASS:  Class II INDICATIONS:  dysphagia, nausea  MEDICATIONS:   MAC sedation, administered by CRNA propofol 180mg IV, glycopyrrolate (Robinal) 0.2 mg IV, 0.6cc simethancone 0.6 cc PO TOPICAL ANESTHETIC:  DESCRIPTION OF PROCEDURE:   After the risks and benefits of the procedure were explained, informed consent was obtained.  The LB GIF-H180 [id number] endoscope was introduced through the mouth and advanced to the third portion of the duodenum.  The instrument was slowly withdrawn as the mucosa was fully examined. <<PROCEDUREIMAGES>>  Moderate gastritis was found in the body and the antrum of the stomach. Moderate nonerosive gastritis Biopsies of the antrum and body of the stomach were obtained and sent to pathology (see image4).  A stricture was found at the gastroesophageal junction (see image6). Early esophageal stricture Dilation with maloney dilator 18mm Minimal resistance; no heme  Otherwise the examination was normal (see image3 and image5).    Retroflexed views revealed no abnormalities.    The scope was then withdrawn from the patient and the procedure completed.  COMPLICATIONS:  None  ENDOSCOPIC IMPRESSION: 1) Moderate gastritis in the body and the antrum of the stomach  2) Stricture at the gastroesophageal junction 3) Otherwise normal examination RECOMMENDATIONS: 1) continue PPI - dexilant 2) My office will arrange for you to have a Gastric Emptying Scan performed. This is a radiology test that gives an idea of how well your stomach functions. 3) OV 3 weeks  ______________________________ Barbette Hair. Arlyce Dice, MD  CC:  Norberto Sorenson MD  n. Rosalie DoctorBarbette Hair.  Elmire Amrein at 07/07/2011 02:41 PM  Jillene Bucks, [phone]

## 2011-07-07 NOTE — Progress Notes (Signed)
1506 PATIENT C/O LT. ARM PAIN AT ELBOW STATING SHE HAS TENDONITIS. BP CUFF REMOVED UNTIL NEXT BP CHECK. PATIENT STATING SHE NEEDS A REFILL ON DEXILANT, DISCUSSED WITH DR. KAPLAN AND PRESCRIPTION GIVEN TO THE PATIENT.(PATIENT USES [facility])Patient did not experience any of the following events: a burn prior to discharge; a fall within the facility; wrong site/side/patient/procedure/implant event; or a hospital transfer or hospital admission upon discharge from the facility. [phone]) Patient did not have preoperative order for IV antibiotic SSI prophylaxis. [phone])

## 2011-07-08 ENCOUNTER — Other Ambulatory Visit: Payer: Self-pay | Admitting: Gastroenterology

## 2011-07-08 ENCOUNTER — Telehealth: Payer: Self-pay

## 2011-07-08 ENCOUNTER — Telehealth: Payer: Self-pay | Admitting: *Deleted

## 2011-07-08 NOTE — Telephone Encounter (Signed)
  Follow up Call-  Call back number 07/07/2011  Post procedure Call Back phone  # [phone]  Permission to leave phone message Yes     Patient questions:  Do you have a fever, pain , or abdominal swelling? no Pain Score  0 *  Have you tolerated food without any problems? yes  Have you been able to return to your normal activities? yes  Do you have any questions about your discharge instructions: Diet   no Medications  no Follow up visit  no  Do you have questions or concerns about your Care? no  Actions: * If pain score is 4 or above: No action needed, pain <4.

## 2011-07-08 NOTE — Telephone Encounter (Signed)
Pt scheduled for GES 07/20/11@[facility]  arrival time 7:45am for an 8am appt. Pt to be NPO after midnight and hold her stomach meds the day before. Pt aware of appt date and time.

## 2011-07-10 ENCOUNTER — Telehealth: Payer: Self-pay | Admitting: Gastroenterology

## 2011-07-10 ENCOUNTER — Encounter: Payer: Self-pay | Admitting: Gastroenterology

## 2011-07-10 ENCOUNTER — Ambulatory Visit (AMBULATORY_SURGERY_CENTER): Payer: Self-pay | Admitting: Gastroenterology

## 2011-07-10 VITALS — BP 126/72 | HR 58 | Temp 96.7°F | Resp 17 | Ht 66.0 in | Wt 243.0 lb

## 2011-07-10 DIAGNOSIS — Z1211 Encounter for screening for malignant neoplasm of colon: Secondary | ICD-10-CM

## 2011-07-10 DIAGNOSIS — Z800 Family history of malignant neoplasm of digestive organs: Secondary | ICD-10-CM

## 2011-07-10 MED ORDER — SODIUM CHLORIDE 0.9 % IV SOLN: 500.0000 mL | INTRAVENOUS | Status: DC

## 2011-07-10 NOTE — Patient Instructions (Signed)
Discharge instructions given with verbal understanding. Normal Exam. Resume previous medications.YOU HAD AN ENDOSCOPIC PROCEDURE TODAY AT THE [facility]: Refer to the procedure report that was given to you for any specific questions about what was found during the examination.  If the procedure report does not answer your questions, please call your gastroenterologist to clarify.  If you requested that your care partner not be given the details of your procedure findings, then the procedure report has been included in a sealed envelope for you to review at your convenience later.  YOU SHOULD EXPECT: Some feelings of bloating in the abdomen. Passage of more gas than usual.  Walking can help get rid of the air that was put into your GI tract during the procedure and reduce the bloating. If you had a lower endoscopy (such as a colonoscopy or flexible sigmoidoscopy) you may notice spotting of blood in your stool or on the toilet paper. If you underwent a bowel prep for your procedure, then you may not have a normal bowel movement for a few days.  DIET: Your first meal following the procedure should be a light meal and then it is ok to progress to your normal diet.  A half-sandwich or bowl of soup is an example of a good first meal.  Heavy or fried foods are harder to digest and may make you feel nauseous or bloated.  Likewise meals heavy in dairy and vegetables can cause extra gas to form and this can also increase the bloating.  Drink plenty of fluids but you should avoid alcoholic beverages for 24 hours.  ACTIVITY: Your care partner should take you home directly after the procedure.  You should plan to take it easy, moving slowly for the rest of the day.  You can resume normal activity the day after the procedure however you should NOT DRIVE or use heavy machinery for 24 hours (because of the sedation medicines used during the test).    SYMPTOMS TO REPORT IMMEDIATELY: A gastroenterologist can  be reached at any hour.  During normal business hours, 8:30 AM to 5:00 PM Monday through Friday, call [phone].  After hours and on weekends, please call the GI answering service at [phone] who will take a message and have the physician on call contact you.   Following lower endoscopy (colonoscopy or flexible sigmoidoscopy):  Excessive amounts of blood in the stool  Significant tenderness or worsening of abdominal pains  Swelling of the abdomen that is new, acute  Fever of 100F or higher  FOLLOW UP: If any biopsies were taken you will be contacted by phone or by letter within the next 1-3 weeks.  Call your gastroenterologist if you have not heard about the biopsies in 3 weeks.  Our staff will call the home number listed on your records the next business day following your procedure to check on you and address any questions or concerns that you may have at that time regarding the information given to you following your procedure. This is a courtesy call and so if there is no answer at the home number and we have not heard from you through the emergency physician on call, we will assume that you have returned to your regular daily activities without incident.  SIGNATURES/CONFIDENTIALITY: You and/or your care partner have signed paperwork which will be entered into your electronic medical record.  These signatures attest to the fact that that the information above on your After Visit Summary has been reviewed and  is understood.  Full responsibility of the confidentiality of this discharge information lies with you and/or your care-partner.

## 2011-07-10 NOTE — Op Note (Signed)
 [facility] [address] [place], [place]  [address]  COLONOSCOPY PROCEDURE REPORT  PATIENT:  Tammy [name], Tammy [name]  MR#:  [id number] BIRTHDATE:  10-04-1955, 56 yrs. old  GENDER:  female ENDOSCOPIST:  Barbette Hair. Arlyce Dice, MD REF. BY: PROCEDURE DATE:  07/10/2011 PROCEDURE:  Diagnostic Colonoscopy ASA CLASS:  Class II INDICATIONS:  Elevated Risk Screening, family history of colon cancer Brother with colon Ca MEDICATIONS:   MAC sedation, administered by CRNA propofol 250mg IV  DESCRIPTION OF PROCEDURE:   After the risks benefits and alternatives of the procedure were thoroughly explained, informed consent was obtained.  Digital rectal exam was performed and revealed no abnormalities.   The LB CF-H180AL [id number] endoscope was introduced through the anus and advanced to the cecum, which was identified by both the appendix and ileocecal valve, without limitations.  The quality of the prep was excellent, using MoviPrep.  The instrument was then slowly withdrawn as the colon was fully examined. <<PROCEDUREIMAGES>>  FINDINGS:  A normal appearing cecum, ileocecal valve, and appendiceal orifice were identified. The ascending, hepatic flexure, transverse, splenic flexure, descending, sigmoid colon, and rectum appeared unremarkable (see image3 and image4). Retroflexed views in the rectum revealed no abnormalities.    The time to cecum =  1) 4.50  minutes. The scope was then withdrawn in 1) 6.0  minutes from the cecum and the procedure completed. COMPLICATIONS:  None ENDOSCOPIC IMPRESSION: 1) Normal colon RECOMMENDATIONS: 1) Given your significant family history of colon cancer, you should have a repeat colonoscopy in 5 years 2) High fiber diet REPEAT EXAM:  In 5 year(s) for Colonoscopy.  ______________________________ Barbette Hair. Arlyce Dice, MD  CC:  Norberto Sorenson MD  n. Rosalie DoctorBarbette Hair. Hamna Asa at 07/10/2011 08:34 AM  Jillene Bucks, [phone]

## 2011-07-10 NOTE — Progress Notes (Signed)
Patient did not experience any of the following events: a burn prior to discharge; a fall within the facility; wrong site/side/patient/procedure/implant event; or a hospital transfer or hospital admission upon discharge from the facility. (G8907) Patient did not have preoperative order for IV antibiotic SSI prophylaxis. (G8918)  

## 2011-07-10 NOTE — Telephone Encounter (Signed)
Told patient to come pick up samples she will be here on Monday She is getting Dexilant from [facility] department in 6 weeks

## 2011-07-13 ENCOUNTER — Encounter: Payer: Self-pay | Admitting: Physician Assistant

## 2011-07-13 ENCOUNTER — Telehealth: Payer: Self-pay | Admitting: *Deleted

## 2011-07-13 ENCOUNTER — Ambulatory Visit (INDEPENDENT_AMBULATORY_CARE_PROVIDER_SITE_OTHER): Payer: Self-pay | Admitting: Physician Assistant

## 2011-07-13 VITALS — BP 128/76 | HR 58 | Ht 66.0 in | Wt 244.0 lb

## 2011-07-13 DIAGNOSIS — I2510 Atherosclerotic heart disease of native coronary artery without angina pectoris: Secondary | ICD-10-CM

## 2011-07-13 MED ORDER — PRAVASTATIN SODIUM 20 MG PO TABS: 20.0000 mg | ORAL_TABLET | Freq: Every evening | ORAL | Status: DC

## 2011-07-13 NOTE — Patient Instructions (Signed)
Your physician recommends that you schedule a follow-up appointment as needed Your physician has recommended you make the following change in your medication: STOP Metoprolol after 2 days and START Pravastatin 20 mg at night Your physician recommends that you return for lab work in: 8 weeks at [facility] (Lipid and Liver profile)

## 2011-07-13 NOTE — Progress Notes (Signed)
[address] [place], [place]  [address] Phone: [phone] Fax:  [phone]  Date:  07/13/2011   Name:  Tammy [name]       DOB:  24-Sep-1955 MRN:  [id number]  PCP:  Dr. Norberto Sorenson at [facility] Primary Cardiologist:  Dr. Sherryl Manges    History of Present Illness: Tammy [name] is a 56 y.o. female who presents for follow up after heart catheterization.    She has a history of minimal nonobstructive CAD by cardiac catheterization 5/04, hypertension, diet-controlled diabetes, sleep apnea, asthma, depression and GERD.  She was seen Dr. Graciela Husbands in the past with bradycardia in the setting of beta blocker therapy which resolved with d/c of her bradycardia.  Dr. Odessa Fleming plan was to see her back p.r.n.  Last echo 9/12: EF 50-55%, mild LAE.  I saw her recently after a trip to the ED with chest pain.  ETT-Myoview was done 06/15/11:  Abnormal stress nuclear study with a small, moderate intensity, partially reversible anterior defect consistent with breast attenuation and mild anterior ischemia.  LV Ejection Fraction: 66%. LV Wall Motion: normal.  We set her up for a heart cath.  LHC 06/29/11: Left mainstem: Normal.  Left anterior descending (LAD): Mild calcification in the proximal LAD. Minimal irregularities less than 10%.  Left circumflex (LCx): Normal.  Right coronary artery (RCA): [name] vessel. It is somewhat small in caliber throughout. To 30% narrowing in the proximal vessel.  Left ventriculography: Left ventricular systolic function is normal, LVEF is estimated at 55-65%, there is no significant mitral regurgitation  Final Conclusions:  1. Mild nonobstructive coronary disease.  2. Normal left ventricular function.  Recommendations: Continue risk factor modification. Proceed with GI evaluation.   Doing well.  She did have one episode of chest tightness that she took NTG for with relief.  Breathing is stable.  No syncope.  She had mod gastritis on her  EGD.  She is on PPI and has a gastric emptying study arranged.    Past Medical History  Diagnosis Date  . Asthma   . Diabetes mellitus   . CAD (coronary artery disease)     myoview 3/13: mild ant ischemia;  LHC 4/13:  LAD < 10%, pRCA 30%, EF 55-65%  . Hypertension   . GERD (gastroesophageal reflux disease)   . Arthritis   . Chronic headaches   . Depression   . Bradycardia     2/2 beta blockers  . Sleep apnea     Current Outpatient Prescriptions  Medication Sig Dispense Refill  . albuterol (PROVENTIL HFA;VENTOLIN HFA) 108 (90 BASE) MCG/ACT inhaler Inhale 2 puffs into the lungs every 6 (six) hours as needed. For shortness of breath.      Marland Kitchen aspirin EC 81 MG tablet Take 81 mg by mouth daily.      Marland Kitchen buPROPion (WELLBUTRIN) 100 MG tablet Take 100 mg by mouth 2 (two) times daily.      . cloNIDine (CATAPRES) 0.2 MG tablet Take 0.2 mg by mouth 2 (two) times daily.      Marland Kitchen desvenlafaxine (PRISTIQ) 50 MG 24 hr tablet Take 50 mg by mouth 2 (two) times daily.      Marland Kitchen dexlansoprazole (DEXILANT) 60 MG capsule Take 1 capsule (60 mg total) by mouth daily.  30 capsule  2  . furosemide (LASIX) 40 MG tablet Take 40 mg by mouth daily.      Marland Kitchen gabapentin (NEURONTIN) 100 MG capsule Take 100 mg by mouth 3 (  three) times daily.      Marland Kitchen lisinopril (PRINIVIL,ZESTRIL) 40 MG tablet Take 40 mg by mouth daily.      . meloxicam (MOBIC) 7.5 MG tablet Take 1 tablet (7.5 mg total) by mouth daily.  30 tablet  0  . metoprolol tartrate (LOPRESSOR) 25 MG tablet Take 0.5 tablets (12.5 mg total) by mouth 2 (two) times daily.  90 tablet  3  . nitroGLYCERIN (NITROSTAT) 0.4 MG SL tablet Place 1 tablet (0.4 mg total) under the tongue every 5 (five) minutes as needed for chest pain.  25 tablet  3  . pantoprazole (PROTONIX) 40 MG tablet Take 40 mg by mouth daily.      . potassium chloride SA (K-DUR,KLOR-CON) 20 MEQ tablet Take 1 tablet (20 mEq total) by mouth 2 (two) times daily.  10 tablet  0  . promethazine (PHENERGAN) 25 MG tablet  Take 25 mg by mouth every 6 (six) hours as needed.      . promethazine (PHENERGAN) 12.5 MG tablet Take 1 tablet (12.5 mg total) by mouth every 6 (six) hours as needed for nausea.  30 tablet  0  . promethazine (PHENERGAN) 25 MG tablet Take 1 tablet (25 mg total) by mouth every 6 (six) hours as needed for nausea.  10 tablet  0    Allergies: No Known Allergies  Social History:  Nonsmoker   PHYSICAL EXAM: VS:  BP 128/76  Pulse 58  Ht 5\' 6"  (1.676 m)  Wt 244 lb (110.678 kg)  BMI 39.38 kg/m2 Well nourished, well developed, in no acute distress HEENT: normal Neck: no JVD Cardiac:  normal S1, S2; RRR; no murmur Lungs:  clear to auscultation bilaterally, no wheezing, rhonchi or rales Abd: soft, nontender, no hepatomegaly Ext: no edema; right groin without hematoma or bruit  Skin: warm and dry Neuro:  CNs 2-12 intact, no focal abnormalities noted Psych: Normal affect  EKG:  Sinus brady, HR 58, normal axis,   ASSESSMENT AND PLAN:  1.  Chest Pain Atypical.  She has minimal plaque on LHC.  Question if CP related to GERD or asthma.  She is a diabetic and could have microvascular disease.  I would not start her on nitrates at this point.  Continue tx of GERD and asthma.    2.  Mild Non-Obstructive CAD Continue risk factor modification.  I suggest she start a statin (Pravastatin 20 mg QHS).  Follow up Lipids and LFTs in 2 mos.  She can continue risk factor modification with her PCP and follow up here PRN.  2.  Hypertension Controlled.  Continue current therapy.   3.  GERD Followed by Dr. Arlyce Dice.  Luna Glasgow, PA-C  12:49 PM 07/13/2011

## 2011-07-13 NOTE — Telephone Encounter (Signed)
  Follow up Call-  Call back number 07/10/2011 07/07/2011  Post procedure Call Back phone  # [phone]  Permission to leave phone message Yes Yes     Patient questions:  Do you have a fever, pain , or abdominal swelling? no Pain Score  0 *  Have you tolerated food without any problems? yes  Have you been able to return to your normal activities? yes  Do you have any questions about your discharge instructions: Diet   no Medications  no Follow up visit  no  Do you have questions or concerns about your Care? no  Actions: * If pain score is 4 or above: No action needed, pain <4.

## 2011-07-14 ENCOUNTER — Encounter: Payer: Self-pay | Admitting: Gastroenterology

## 2011-07-20 ENCOUNTER — Encounter (HOSPITAL_COMMUNITY)
Admission: RE | Admit: 2011-07-20 | Discharge: 2011-07-20 | Disposition: A | Discharge status: Home / Self Care | Payer: Self-pay | Source: Ambulatory Visit | Attending: Gastroenterology | Admitting: Gastroenterology

## 2011-07-20 DIAGNOSIS — R109 Unspecified abdominal pain: Secondary | ICD-10-CM | POA: Insufficient documentation

## 2011-07-20 MED ORDER — TECHNETIUM TC 99M SULFUR COLLOID
2.0000 | Freq: Once | INTRAVENOUS | Status: AC | PRN
  Administered 2011-07-20: 2 via INTRAVENOUS

## 2011-07-21 NOTE — Progress Notes (Signed)
Quick Note:  Please inform the patient that the GES was normal and to continue current plan of action ______ 

## 2011-08-20 ENCOUNTER — Ambulatory Visit: Payer: Self-pay | Admitting: Gastroenterology

## 2011-09-28 ENCOUNTER — Ambulatory Visit (INDEPENDENT_AMBULATORY_CARE_PROVIDER_SITE_OTHER): Payer: Self-pay | Admitting: Gastroenterology

## 2011-09-28 ENCOUNTER — Encounter: Payer: Self-pay | Admitting: Gastroenterology

## 2011-09-28 VITALS — BP 116/62 | HR 60 | Ht 66.0 in | Wt 250.0 lb

## 2011-09-28 DIAGNOSIS — Z800 Family history of malignant neoplasm of digestive organs: Secondary | ICD-10-CM

## 2011-09-28 DIAGNOSIS — R110 Nausea: Secondary | ICD-10-CM

## 2011-09-28 DIAGNOSIS — R1310 Dysphagia, unspecified: Secondary | ICD-10-CM

## 2011-09-28 NOTE — Progress Notes (Signed)
History of Present Illness:  Tammy [name] has returned following upper and lower endoscopy. An early esophageal stricture was dilated to 18 mm. Colonoscopy was negative. She initially noted improvement in her dysphagia. She now has dysphagia again characterized by occasional food sticking in her upper chest. She also complains of frequent nausea that is unrelated to eating. She's taking Mobic, protonix and dexilant. Gastric emptying scan was low borderline normal.    Review of Systems: Pertinent positive and negative review of systems were noted in the above HPI section. All other review of systems were otherwise negative.    Current Medications, Allergies, Past Medical History, Past Surgical History, Family History and Social History were reviewed in [employer] electronic medical record  Vital signs were reviewed in today's medical record. Physical Exam: General: Well developed , well nourished, no acute distress

## 2011-09-28 NOTE — Assessment & Plan Note (Signed)
Transient improvement with esophageal dilatation but now symptomatic again.  Recommendations #1 barium swallow

## 2011-09-28 NOTE — Assessment & Plan Note (Signed)
Followup colonoscopy 2018 

## 2011-09-28 NOTE — Patient Instructions (Addendum)
You have been scheduled for a Barium Swallow on Wednesday 09/30/2011 at 11am to arrive at 10:45am Nothing to eat or drink 4 hours before test Follow up in 3-4 weeks Discontinue Mobic and Protonix Call back in 5-7 days and let us know how you are doing

## 2011-09-28 NOTE — Assessment & Plan Note (Addendum)
Symptoms could be medicine-related. She's taking 2 PPIs and Mobic.  Gastric emptying scan was borderline low normal.  Recommendations #1 hold Mobic and protonix while continuing dexilant. The patient was instructed to call back in 5-7 days to report her progress. #2 to consider trial of metoclopramide pending results of #1

## 2011-09-30 ENCOUNTER — Ambulatory Visit (HOSPITAL_COMMUNITY)
Admission: RE | Admit: 2011-09-30 | Discharge: 2011-09-30 | Disposition: A | Discharge status: Home / Self Care | Payer: Self-pay | Source: Ambulatory Visit | Attending: Gastroenterology | Admitting: Gastroenterology

## 2011-09-30 DIAGNOSIS — K449 Diaphragmatic hernia without obstruction or gangrene: Secondary | ICD-10-CM | POA: Insufficient documentation

## 2011-09-30 DIAGNOSIS — R1310 Dysphagia, unspecified: Secondary | ICD-10-CM | POA: Insufficient documentation

## 2011-09-30 NOTE — Progress Notes (Signed)
Quick Note:  Please inform the patient that xray was normal and to continue current plan of action ______

## 2011-10-05 ENCOUNTER — Telehealth: Payer: Self-pay | Admitting: Gastroenterology

## 2011-10-05 NOTE — Telephone Encounter (Signed)
Pt states that her nausea is better than it was, she still has some nausea. States that her stomach is still "hot" feeling. Pt was told to call with an update on her condition. Dr. Arlyce Dice aware.

## 2011-10-05 NOTE — Telephone Encounter (Signed)
Pt aware.

## 2011-10-05 NOTE — Telephone Encounter (Signed)
Continue to hold mobic and protonix. C/b 1 week

## 2011-10-30 ENCOUNTER — Ambulatory Visit (INDEPENDENT_AMBULATORY_CARE_PROVIDER_SITE_OTHER): Payer: Self-pay | Admitting: Gastroenterology

## 2011-10-30 ENCOUNTER — Encounter: Payer: Self-pay | Admitting: Gastroenterology

## 2011-10-30 VITALS — BP 140/80 | HR 60 | Ht 64.0 in | Wt 250.5 lb

## 2011-10-30 DIAGNOSIS — R1310 Dysphagia, unspecified: Secondary | ICD-10-CM

## 2011-10-30 DIAGNOSIS — R110 Nausea: Secondary | ICD-10-CM

## 2011-10-30 MED ORDER — ERYTHROMYCIN BASE 250 MG PO TABS: 250.0000 mg | ORAL_TABLET | Freq: Four times a day (QID) | ORAL | Status: AC

## 2011-10-30 MED ORDER — ERYTHROMYCIN BASE 250 MG PO TABS: 250.0000 mg | ORAL_TABLET | Freq: Four times a day (QID) | ORAL | Status: DC

## 2011-10-30 NOTE — Patient Instructions (Addendum)
No allergy to soy or eggs. Follow up in 6 weeks

## 2011-10-30 NOTE — Assessment & Plan Note (Signed)
Currently asymptomatic. Negative swallow.

## 2011-10-30 NOTE — Assessment & Plan Note (Signed)
Symptoms are not clearly related to PPI therapy or Mobic.  Gastric emptying is borderline normal.  Recommendations #1 trial of erythromycin (Reglan interacts with Pristiq)

## 2011-10-30 NOTE — Progress Notes (Signed)
History of Present Illness:  Tammy [name] has returned for followup of nausea. Despite switching from protonix to dexilant and discontinuing Clinoril she still complains of intermittent nausea and pyrosis. She has mild lower abdominal discomfort as well.  Recent barium swallow was normal. Gastric emptying scan was low normal. She's currently not complaining of dysphagia.    Review of Systems: Pertinent positive and negative review of systems were noted in the above HPI section. All other review of systems were otherwise negative.    Current Medications, Allergies, Past Medical History, Past Surgical History, Family History and Social History were reviewed in [employer] electronic medical record  Vital signs were reviewed in today's medical record. Physical Exam: General: Well developed , well nourished, no acute distress

## 2011-11-03 ENCOUNTER — Telehealth: Payer: Self-pay | Admitting: Gastroenterology

## 2011-11-03 NOTE — Telephone Encounter (Signed)
Amy, This patient states she can not afford Ery Tab prescribed by Dr Arlyce Dice What do you recommend

## 2011-11-04 NOTE — Telephone Encounter (Signed)
reglan interacts with Pristiqu. Just seen it in office note  Regular Erythromycin is 105$. For her I just called  Reglan is 4$

## 2011-11-04 NOTE — Telephone Encounter (Signed)
This place is [facility]. They only help with [employer] medications Tried to contact patient and her voice mail is not set up

## 2011-11-04 NOTE — Telephone Encounter (Signed)
Can we use the Reglan? For this patient. 4$ at [facility]

## 2011-11-04 NOTE — Telephone Encounter (Signed)
Pt cannot use Reglan with pristiq, if she cannot afford erythromycin then she needs to follow a strict gastropareisi diet -please give her a copy

## 2011-11-04 NOTE — Telephone Encounter (Signed)
Going to be hard to find anything cheaper than erythromycin- try giving her generic erythromycin  250 mg  30 minutes before meals or liquid erythromycin at same dose

## 2011-11-06 NOTE — Telephone Encounter (Signed)
Spoke with patient, mailed her a gastroparesis diet today

## 2011-12-14 ENCOUNTER — Ambulatory Visit: Payer: Self-pay | Admitting: Gastroenterology

## 2011-12-17 ENCOUNTER — Ambulatory Visit (INDEPENDENT_AMBULATORY_CARE_PROVIDER_SITE_OTHER): Payer: Self-pay | Admitting: Gastroenterology

## 2011-12-17 ENCOUNTER — Encounter: Payer: Self-pay | Admitting: Gastroenterology

## 2011-12-17 VITALS — BP 162/84 | HR 72 | Ht 63.75 in | Wt 253.1 lb

## 2011-12-17 DIAGNOSIS — R110 Nausea: Secondary | ICD-10-CM

## 2011-12-17 MED ORDER — LUBIPROSTONE 8 MCG PO CAPS: 8.0000 ug | ORAL_CAPSULE | Freq: Two times a day (BID) | ORAL | Status: DC

## 2011-12-17 NOTE — Progress Notes (Signed)
History of Present Illness:  Ms. House continues to complain of nausea. This tends to occur rather chronically but may be exacerbated by eating. She was unable to afford erythromycin. Gastric emptying scan demonstrates a borderline low normal emptying at 2 hours at 29%. Holding medicines including Mobic and protonix did not help her nausea.    Review of Systems: Pertinent positive and negative review of systems were noted in the above HPI section. All other review of systems were otherwise negative.    Current Medications, Allergies, Past Medical History, Past Surgical History, Family History and Social History were reviewed in [employer] [facility] electronic medical record  Vital signs were reviewed in today's medical record. Physical Exam: General: Well developed , well nourished, no acute distress

## 2011-12-17 NOTE — Assessment & Plan Note (Signed)
Symptoms are not clearly related to PPI therapy or Mobic.  Gastric emptying is borderline normal.  Unable to afford erythromycin. Nausea may be related to gastroparesis and/or polypharmacy.  Recommendations #1 I will give her samples of 8 mcg amitiza tablets to take twice a day

## 2011-12-17 NOTE — Patient Instructions (Signed)
We have given you samples of the following medication to take: Amitiza Please take Amitiza one by mouth twice daily   Please call back in two weeks with an update on Amitiza  Discontinue Dexilant  Please follow Gastroparesis diet

## 2012-12-20 ENCOUNTER — Ambulatory Visit (INDEPENDENT_AMBULATORY_CARE_PROVIDER_SITE_OTHER): Payer: BC Managed Care – PPO | Admitting: Physician Assistant

## 2012-12-20 ENCOUNTER — Encounter: Payer: Self-pay | Admitting: Physician Assistant

## 2012-12-20 VITALS — BP 126/82 | HR 68 | Ht 63.75 in | Wt 245.0 lb

## 2012-12-20 DIAGNOSIS — I10 Essential (primary) hypertension: Secondary | ICD-10-CM

## 2012-12-20 DIAGNOSIS — I2510 Atherosclerotic heart disease of native coronary artery without angina pectoris: Secondary | ICD-10-CM

## 2012-12-20 DIAGNOSIS — R61 Generalized hyperhidrosis: Secondary | ICD-10-CM

## 2012-12-20 DIAGNOSIS — E785 Hyperlipidemia, unspecified: Secondary | ICD-10-CM

## 2012-12-20 DIAGNOSIS — E119 Type 2 diabetes mellitus without complications: Secondary | ICD-10-CM

## 2012-12-20 NOTE — Progress Notes (Signed)
[address] [place], [place]  [address] Phone: [phone] Fax:  [phone]  Date:  12/20/2012   ID:  Tammy, [name] Feb 23, 1956, MRN [id number]  PCP:  Dala Dock  Cardiologist:  Dr. Sherryl Manges     History of Present Illness: Tammy [name] is a 57 y.o. female who returns for follow up.  She has a history of minimal nonobstructive CAD by cardiac catheterization 5/04, HTN, diet-controlled diabetes, sleep apnea, asthma, depression and GERD. She was seen Dr. Graciela Husbands in the past with bradycardia in the setting of beta blocker therapy which resolved with d/c of this drug. Dr. Odessa Fleming plan was to see her back p.r.n. Echo 9/12: EF 50-55%, mild LAE.  ETT-Myoview 06/15/11: Abnormal stress nuclear study with a small, moderate intensity, partially reversible anterior defect consistent with breast attenuation and mild anterior ischemia. LV Ejection Fraction: 66%. LV Wall Motion: normal.  LHC 06/29/11:  pRCA 30%, LAD with < 10% irregs, EF 55-65%.  Last seen here in 06/2011.    She has had recent symptoms of "shakiness" and excessive sweating.  She has occasional CP.  This has been ongoing for years without change.  No exertional CP.  She notes occasional DOE.  She is NYHA Class II.  No orthopnea.  She sleeps with CPAP.  She has mild pedal edema.  She was seen in follow up by her PCP and was told to follow up here.  I do not have any records.    Wt Readings from Last 3 Encounters:  12/17/11 253 lb 2 oz (114.817 kg)  10/30/11 250 lb 8 oz (113.626 kg)  09/28/11 250 lb (113.399 kg)     Past Medical History  Diagnosis Date  . Asthma   . Diabetes mellitus   . CAD (coronary artery disease)     myoview 3/13: mild ant ischemia;  LHC 4/13:  LAD < 10%, pRCA 30%, EF 55-65%  . Hypertension   . GERD (gastroesophageal reflux disease)   . Arthritis   . Chronic headaches   . Depression   . Bradycardia     2/2 beta blockers  . Sleep apnea   . Hyperlipemia     Current Outpatient  Prescriptions  Medication Sig Dispense Refill  . albuterol (PROVENTIL HFA;VENTOLIN HFA) 108 (90 BASE) MCG/ACT inhaler Inhale 2 puffs into the lungs every 6 (six) hours as needed. For shortness of breath.      Marland Kitchen aspirin EC 81 MG tablet Take 81 mg by mouth daily.      Marland Kitchen buPROPion (WELLBUTRIN) 100 MG tablet Take 100 mg by mouth 2 (two) times daily.      . cloNIDine (CATAPRES) 0.2 MG tablet Take 0.2 mg by mouth 2 (two) times daily.      Marland Kitchen desvenlafaxine (PRISTIQ) 50 MG 24 hr tablet Take 50 mg by mouth 2 (two) times daily.      Marland Kitchen dexlansoprazole (DEXILANT) 60 MG capsule Take 60 mg by mouth daily.      . furosemide (LASIX) 40 MG tablet Take 40 mg by mouth daily.      Marland Kitchen gabapentin (NEURONTIN) 100 MG capsule Take 100 mg by mouth 3 (three) times daily.      . hydrOXYzine (ATARAX/VISTARIL) 25 MG tablet Take 25 mg by mouth at bedtime.      Marland Kitchen lisinopril (PRINIVIL,ZESTRIL) 40 MG tablet Take 40 mg by mouth daily.      . metFORMIN (GLUCOPHAGE) 500 MG tablet Take 500 mg by mouth daily with breakfast.       .  nitroGLYCERIN (NITROSTAT) 0.4 MG SL tablet Place 1 tablet (0.4 mg total) under the tongue every 5 (five) minutes as needed for chest pain.  25 tablet  3  . pantoprazole (PROTONIX) 40 MG tablet Take 40 mg by mouth daily.      . potassium chloride (K-DUR,KLOR-CON) 10 MEQ tablet Take 10 mEq by mouth 2 (two) times daily.      . pravastatin (PRAVACHOL) 20 MG tablet Take 1 tablet (20 mg total) by mouth every evening.  30 tablet  11  . topiramate (TOPAMAX) 25 MG tablet Take 25 mg by mouth 2 (two) times daily.       No current facility-administered medications for this visit.    Allergies:   No Known Allergies  Social History:  The patient  reports that she has quit smoking. She has never used smokeless tobacco. She reports that she does not drink alcohol or use illicit drugs.   ROS:  Please see the history of present illness.      All other systems reviewed and negative.   PHYSICAL EXAM: VS:  BP 126/82   Pulse 68  Ht 5' 3.75" (1.619 m)  Wt 245 lb (111.131 kg)  BMI 42.4 kg/m2 Well nourished, well developed, in no acute distress HEENT: normal Neck: no JVD Cardiac:  normal S1, S2; RRR; no murmur Lungs:  clear to auscultation bilaterally, no wheezing, rhonchi or rales Abd: soft, nontender, no hepatomegaly Ext: no edema Skin: warm and dry Neuro:  CNs 2-12 intact, no focal abnormalities noted  EKG:  NSR, HR 69, normal axis, no acute changes     ASSESSMENT AND PLAN:  1. Diaphoresis:  She has been post-menopausal for years.  She recently started on Metformin for DM2.  Her sugars are "up and down."  Question if symptoms related to diabetes.  I would start by checking labs to include BMET, TSH, CBC.  She tells me she had labs drawn recently with her PCP.  Will request these.  I would also be suspicious of her medications as a potential culprit.  Pristiq can cause tremors and hyperhidrosis.  I have advised her to f/u with her PCP and Psychiatrist to review her medications.  She does not require further cardiac testing. 2. CAD:  She had minimal plaque by cardiac cath last year.  She has atypical CP.  She does not need further cardiac testing.  Continue ASA and statin. 3. Hypertension:  Controlled.  Continue current therapy.  4. Hyperlipidemia:  Continue statin. 5. Diabetes Mellitus: f/u with PCP. 6. Disposition:  F/u with PCP for risk factor modification.  F/u with Dr. Sherryl Manges prn.   Signed, Tereso Newcomer, PA-C  12/20/2012 2:36 PM

## 2012-12-20 NOTE — Patient Instructions (Addendum)
Your physician recommends that you continue on your current medications as directed. Please refer to the Current Medication list given to you today.    Your physician recommends that you schedule a follow-up appointment in:  AS NEEDED 

## 2013-03-12 ENCOUNTER — Encounter (HOSPITAL_COMMUNITY): Payer: Self-pay | Admitting: Emergency Medicine

## 2013-03-12 ENCOUNTER — Emergency Department (HOSPITAL_COMMUNITY)
Admission: EM | Admit: 2013-03-12 | Discharge: 2013-03-12 | Disposition: A | Discharge status: Home / Self Care | Payer: BC Managed Care – PPO | Attending: Emergency Medicine | Admitting: Emergency Medicine

## 2013-03-12 DIAGNOSIS — F329 Major depressive disorder, single episode, unspecified: Secondary | ICD-10-CM | POA: Insufficient documentation

## 2013-03-12 DIAGNOSIS — M129 Arthropathy, unspecified: Secondary | ICD-10-CM | POA: Insufficient documentation

## 2013-03-12 DIAGNOSIS — Z87891 Personal history of nicotine dependence: Secondary | ICD-10-CM | POA: Insufficient documentation

## 2013-03-12 DIAGNOSIS — I2510 Atherosclerotic heart disease of native coronary artery without angina pectoris: Secondary | ICD-10-CM | POA: Insufficient documentation

## 2013-03-12 DIAGNOSIS — E785 Hyperlipidemia, unspecified: Secondary | ICD-10-CM | POA: Insufficient documentation

## 2013-03-12 DIAGNOSIS — I10 Essential (primary) hypertension: Secondary | ICD-10-CM | POA: Insufficient documentation

## 2013-03-12 DIAGNOSIS — Z7982 Long term (current) use of aspirin: Secondary | ICD-10-CM | POA: Insufficient documentation

## 2013-03-12 DIAGNOSIS — F3289 Other specified depressive episodes: Secondary | ICD-10-CM | POA: Insufficient documentation

## 2013-03-12 DIAGNOSIS — A059 Bacterial foodborne intoxication, unspecified: Secondary | ICD-10-CM | POA: Insufficient documentation

## 2013-03-12 DIAGNOSIS — E119 Type 2 diabetes mellitus without complications: Secondary | ICD-10-CM | POA: Insufficient documentation

## 2013-03-12 DIAGNOSIS — J45909 Unspecified asthma, uncomplicated: Secondary | ICD-10-CM | POA: Insufficient documentation

## 2013-03-12 DIAGNOSIS — R110 Nausea: Secondary | ICD-10-CM | POA: Insufficient documentation

## 2013-03-12 DIAGNOSIS — Z79899 Other long term (current) drug therapy: Secondary | ICD-10-CM | POA: Insufficient documentation

## 2013-03-12 DIAGNOSIS — K219 Gastro-esophageal reflux disease without esophagitis: Secondary | ICD-10-CM | POA: Insufficient documentation

## 2013-03-12 MED ORDER — ONDANSETRON HCL 8 MG PO TABS: 8.0000 mg | ORAL_TABLET | Freq: Three times a day (TID) | ORAL | Status: DC | PRN

## 2013-03-12 MED ORDER — DIPHENHYDRAMINE HCL 25 MG PO CAPS: 25.0000 mg | ORAL_CAPSULE | Freq: Once | ORAL | Status: DC

## 2013-03-12 NOTE — ED Notes (Signed)
Pt arrived to the ED with a complaint of a possible allergic reaction to leftover food that that the pt had rewarmed.  Pt states that she was eating shrimp chow mien when towards the end of the meal her tongue began to feel numb and she developed bitter taste in her mouth.  Pt also began to feel nausea but no episodes of emesis ensued.  Pt states she waited for a period of time but the numbness and bitter taste have not subsided.

## 2013-03-12 NOTE — ED Provider Notes (Signed)
Medical screening examination/treatment/procedure(s) were performed by non-physician practitioner and as supervising physician I was immediately available for consultation/collaboration.    Olivia Mackie, MD 03/12/13 2133

## 2013-03-12 NOTE — ED Provider Notes (Signed)
CSN: [id number]     Arrival date & time 03/12/13  0021 History   First MD Initiated Contact with Patient 03/12/13 0204     Chief Complaint  Patient presents with  . Nausea  . Allergic Reaction   (Consider location/radiation/quality/duration/timing/severity/associated sxs/prior Treatment) HPI History provided by pt.   Pt was eating leftover shrimp chow mein last night at 9pm, bit into something that didn't take right and immediately developed a bitter taste, numbness/tingling of her tongue and nausea.  Spit food out and sx have gradually improved since then, though still present.  She denies associated vision changes, dizziness, extremity weakness/paresthsias, dyphagia, tongue/lip edema, rash, abd pain, diarrhea.  She told nursing staff that her speech seems a little bit slurred. No known allergies.   Past Medical History  Diagnosis Date  . Asthma   . Diabetes mellitus   . CAD (coronary artery disease)     myoview 3/13: mild ant ischemia;  LHC 4/13:  LAD < 10%, pRCA 30%, EF 55-65%  . Hypertension   . GERD (gastroesophageal reflux disease)   . Arthritis   . Chronic headaches   . Depression   . Bradycardia     2/2 beta blockers  . Sleep apnea   . Hyperlipemia    Past Surgical History  Procedure Laterality Date  . Abdominal hysterectomy    . Appendectomy     Family History  Problem Relation Age of Onset  . Colon cancer Brother   . Colon cancer Paternal Aunt   . Stomach cancer Paternal Grandmother   . Heart disease Father   . Heart disease Brother     x3  . Esophageal cancer Neg Hx   . Rectal cancer Neg Hx    History  Substance Use Topics  . Smoking status: Former Games developer  . Smokeless tobacco: Never Used  . Alcohol Use: No   OB History   Grav Para Term Preterm Abortions TAB SAB Ect Mult Living                 Review of Systems  All other systems reviewed and are negative.    Allergies  Review of patient's allergies indicates no known allergies.  Home  Medications   Current Outpatient Rx  Name  Route  Sig  Dispense  Refill  . albuterol (PROVENTIL HFA;VENTOLIN HFA) 108 (90 BASE) MCG/ACT inhaler   Inhalation   Inhale 2 puffs into the lungs every 6 (six) hours as needed for wheezing or shortness of breath. For shortness of breath.         Marland Kitchen aspirin EC 81 MG tablet   Oral   Take 81 mg by mouth every morning.          . cloNIDine (CATAPRES) 0.2 MG tablet   Oral   Take 0.2 mg by mouth 2 (two) times daily.         Marland Kitchen dexlansoprazole (DEXILANT) 60 MG capsule   Oral   Take 60 mg by mouth daily.         . furosemide (LASIX) 40 MG tablet   Oral   Take 40 mg by mouth daily.         Marland Kitchen gabapentin (NEURONTIN) 100 MG capsule   Oral   Take 100 mg by mouth 3 (three) times daily.         . hydrOXYzine (ATARAX/VISTARIL) 25 MG tablet   Oral   Take 25 mg by mouth at bedtime.         Marland Kitchen  lisinopril (PRINIVIL,ZESTRIL) 40 MG tablet   Oral   Take 40 mg by mouth daily.         . metFORMIN (GLUCOPHAGE) 500 MG tablet   Oral   Take 500 mg by mouth daily with breakfast.          . potassium chloride (K-DUR,KLOR-CON) 10 MEQ tablet   Oral   Take 10 mEq by mouth 2 (two) times daily.         . pravastatin (PRAVACHOL) 20 MG tablet   Oral   Take 20 mg by mouth every evening.         . nitroGLYCERIN (NITROSTAT) 0.4 MG SL tablet   Sublingual   Place 0.4 mg under the tongue every 5 (five) minutes as needed for chest pain.          BP 158/78  Pulse 79  Temp(Src) 98.2 F (36.8 C) (Oral)  Resp 16  SpO2 97% Physical Exam  Nursing note and vitals reviewed. Constitutional: She is oriented to person, place, and time. She appears well-developed and well-nourished. No distress.  HENT:  Head: Normocephalic and atraumatic.  No tongue/lip edema  Eyes:  Normal appearance  Neck: Normal range of motion.  Cardiovascular: Normal rate, regular rhythm and intact distal pulses.   Pulmonary/Chest: Effort normal and breath sounds  normal.  Abdominal: Soft. Bowel sounds are normal. She exhibits no distension.  Musculoskeletal: Normal range of motion.  Neurological: She is alert and oriented to person, place, and time. No sensory deficit. Coordination normal.  CN 3-12 intact.  No nystagmus. 5/5 and equal upper and lower extremity strength.  No past pointing.     Skin: Skin is warm and dry. No rash noted.  Psychiatric: She has a normal mood and affect. Her behavior is normal.    ED Course  Procedures (including critical care time) Labs Review Labs Reviewed - No data to display Imaging Review No results found.  EKG Interpretation   None       MDM   1. Food poisoning    57yo F w/ CAD, diabetes, HTN, GERD, presents w/ tongue paresthesias, bitter taste and nausea that developed when she bit into something that didn't taste right last night.  Sx persistent but improved.  No tongue/lip edema or rash, no focal neuro deficits, abd benign on exam.  Discussed case w/ Dr. Norlene Campbell and she believes that the patient has scombroid poisoning from the shrimp chow mein.  Pt d/c'd home w/ recommendation to take benadryl and drink plenty of fluids.  Return precautions discussed.     Otilio Miu, PA-C 03/12/13 2006

## 2013-04-07 ENCOUNTER — Emergency Department (HOSPITAL_COMMUNITY)
Admission: EM | Admit: 2013-04-07 | Discharge: 2013-04-08 | Disposition: A | Discharge status: Home / Self Care | Payer: BC Managed Care – PPO | Attending: Emergency Medicine | Admitting: Emergency Medicine

## 2013-04-07 ENCOUNTER — Encounter (HOSPITAL_COMMUNITY): Payer: Self-pay | Admitting: Emergency Medicine

## 2013-04-07 DIAGNOSIS — I10 Essential (primary) hypertension: Secondary | ICD-10-CM | POA: Insufficient documentation

## 2013-04-07 DIAGNOSIS — Z87891 Personal history of nicotine dependence: Secondary | ICD-10-CM | POA: Insufficient documentation

## 2013-04-07 DIAGNOSIS — R209 Unspecified disturbances of skin sensation: Secondary | ICD-10-CM | POA: Insufficient documentation

## 2013-04-07 DIAGNOSIS — G8929 Other chronic pain: Secondary | ICD-10-CM | POA: Insufficient documentation

## 2013-04-07 DIAGNOSIS — Z79899 Other long term (current) drug therapy: Secondary | ICD-10-CM | POA: Insufficient documentation

## 2013-04-07 DIAGNOSIS — F329 Major depressive disorder, single episode, unspecified: Secondary | ICD-10-CM | POA: Insufficient documentation

## 2013-04-07 DIAGNOSIS — E785 Hyperlipidemia, unspecified: Secondary | ICD-10-CM | POA: Insufficient documentation

## 2013-04-07 DIAGNOSIS — F3289 Other specified depressive episodes: Secondary | ICD-10-CM | POA: Insufficient documentation

## 2013-04-07 DIAGNOSIS — R519 Headache, unspecified: Secondary | ICD-10-CM

## 2013-04-07 DIAGNOSIS — I498 Other specified cardiac arrhythmias: Secondary | ICD-10-CM | POA: Insufficient documentation

## 2013-04-07 DIAGNOSIS — E119 Type 2 diabetes mellitus without complications: Secondary | ICD-10-CM | POA: Insufficient documentation

## 2013-04-07 DIAGNOSIS — M129 Arthropathy, unspecified: Secondary | ICD-10-CM | POA: Insufficient documentation

## 2013-04-07 DIAGNOSIS — I2510 Atherosclerotic heart disease of native coronary artery without angina pectoris: Secondary | ICD-10-CM | POA: Insufficient documentation

## 2013-04-07 DIAGNOSIS — Z7982 Long term (current) use of aspirin: Secondary | ICD-10-CM | POA: Insufficient documentation

## 2013-04-07 DIAGNOSIS — K219 Gastro-esophageal reflux disease without esophagitis: Secondary | ICD-10-CM | POA: Insufficient documentation

## 2013-04-07 DIAGNOSIS — J45909 Unspecified asthma, uncomplicated: Secondary | ICD-10-CM | POA: Insufficient documentation

## 2013-04-07 DIAGNOSIS — R51 Headache: Secondary | ICD-10-CM | POA: Insufficient documentation

## 2013-04-07 NOTE — ED Notes (Signed)
Pt arrived to the ED with a complaint of a headache.  Pt states her pain began this evening in the right hand temple area.  Pt states she has trouble describing the pain but that it "feels funny."  Pt also states " i can feel you touching the area but it feels funny."  Pt states the pain began earlier this evening.

## 2013-04-08 ENCOUNTER — Emergency Department (HOSPITAL_COMMUNITY): Payer: BC Managed Care – PPO

## 2013-04-08 LAB — CBC WITH DIFFERENTIAL/PLATELET
BASOS ABS: 0 10*3/uL (ref 0.0–0.1)
Basophils Relative: 1 % (ref 0–1)
EOS ABS: 0.2 10*3/uL (ref 0.0–0.7)
EOS PCT: 2 % (ref 0–5)
HEMATOCRIT: 35.5 % — AB (ref 36.0–46.0)
Hemoglobin: 11.8 g/dL — ABNORMAL LOW (ref 12.0–15.0)
Lymphocytes Relative: 43 % (ref 12–46)
Lymphs Abs: 3 10*3/uL (ref 0.7–4.0)
MCH: 29.1 pg (ref 26.0–34.0)
MCHC: 33.2 g/dL (ref 30.0–36.0)
MCV: 87.7 fL (ref 78.0–100.0)
MONO ABS: 0.4 10*3/uL (ref 0.1–1.0)
Monocytes Relative: 6 % (ref 3–12)
Neutro Abs: 3.5 10*3/uL (ref 1.7–7.7)
Neutrophils Relative %: 49 % (ref 43–77)
PLATELETS: 332 10*3/uL (ref 150–400)
RBC: 4.05 MIL/uL (ref 3.87–5.11)
RDW: 15.4 % (ref 11.5–15.5)
WBC: 7.1 10*3/uL (ref 4.0–10.5)

## 2013-04-08 LAB — BASIC METABOLIC PANEL
BUN: 15 mg/dL (ref 6–23)
CALCIUM: 8.9 mg/dL (ref 8.4–10.5)
CO2: 25 meq/L (ref 19–32)
CREATININE: 0.57 mg/dL (ref 0.50–1.10)
Chloride: 109 mEq/L (ref 96–112)
GFR calc Af Amer: >90 mL/min (ref >90)
Glucose, Bld: 146 mg/dL — ABNORMAL HIGH (ref 70–99)
Potassium: 3.9 mEq/L (ref 3.7–5.3)
SODIUM: 146 meq/L (ref 137–147)

## 2013-04-08 LAB — SEDIMENTATION RATE: Sed Rate: 35 mm/hr — ABNORMAL HIGH (ref 0–22)

## 2013-04-08 LAB — C-REACTIVE PROTEIN: CRP: <0.5 mg/dL — ABNORMAL LOW (ref <0.60)

## 2013-04-08 MED ORDER — SODIUM CHLORIDE 0.9 % IV BOLUS (SEPSIS)
500.0000 mL | Freq: Once | INTRAVENOUS | Status: AC
  Administered 2013-04-08: 500 mL via INTRAVENOUS

## 2013-04-08 MED ORDER — TRAMADOL HCL 50 MG PO TABS: 50.0000 mg | ORAL_TABLET | Freq: Four times a day (QID) | ORAL | Status: DC | PRN

## 2013-04-08 MED ORDER — PREDNISONE 20 MG PO TABS
60.0000 mg | ORAL_TABLET | Freq: Once | ORAL | Status: AC
  Administered 2013-04-08: 60 mg via ORAL
  Filled 2013-04-08: qty 3

## 2013-04-08 MED ORDER — KETOROLAC TROMETHAMINE 30 MG/ML IJ SOLN
15.0000 mg | Freq: Once | INTRAMUSCULAR | Status: AC
  Administered 2013-04-08: 15 mg via INTRAVENOUS

## 2013-04-08 MED ORDER — FENTANYL CITRATE 0.05 MG/ML IJ SOLN
75.0000 ug | Freq: Once | INTRAMUSCULAR | Status: AC
  Administered 2013-04-08: 75 ug via INTRAVENOUS
  Filled 2013-04-08: qty 2

## 2013-04-08 MED ORDER — ONDANSETRON 4 MG PO TBDP: ORAL_TABLET | ORAL | Status: DC

## 2013-04-08 MED ORDER — MORPHINE SULFATE 4 MG/ML IJ SOLN
4.0000 mg | Freq: Once | INTRAMUSCULAR | Status: AC
  Administered 2013-04-08: 4 mg via INTRAVENOUS
  Filled 2013-04-08: qty 1

## 2013-04-08 MED ORDER — PREDNISONE 20 MG PO TABS: ORAL_TABLET | ORAL | Status: DC

## 2013-04-08 MED ORDER — ONDANSETRON HCL 4 MG/2ML IJ SOLN
4.0000 mg | Freq: Once | INTRAMUSCULAR | Status: AC
  Administered 2013-04-08: 4 mg via INTRAVENOUS
  Filled 2013-04-08: qty 2

## 2013-04-08 MED ORDER — KETOROLAC TROMETHAMINE 30 MG/ML IJ SOLN
INTRAMUSCULAR | Status: AC
  Filled 2013-04-08: qty 1

## 2013-04-08 NOTE — Discharge Instructions (Signed)
Take zofran for nausea. Take over the counter meds for headache. Very important for you to see your doctor Monday and follow up with specialist. For severe pain take tramadol  however realize they have the potential for addiction and it can make you sleepy and has tylenol in it.  No operating machinery while taking.  If you were given medicines take as directed.  If you are on coumadin or contraceptives realize their levels and effectiveness is altered by many different medicines.  If you have any reaction (rash, tongues swelling, other) to the medicines stop taking and see a physician.   Please follow up as directed and return to the ER or see a physician for new or worsening symptoms (weakness, numbness, no improvement).  Thank you.

## 2013-04-08 NOTE — ED Provider Notes (Signed)
CSN: [id number]     Arrival date & time 04/07/13  2247 History   First MD Initiated Contact with Patient 04/07/13 2330     Chief Complaint  Patient presents with  . Headache   (Consider location/radiation/quality/duration/timing/severity/associated sxs/prior Treatment) HPI Comments: 58 yo female with DM, htn, sleep apnea, gerd, CAD presents with HA.  Located right temple, no hx of similar, throb ache, took 2 hrs to become severe.  Denies fever, vision loss/ glaucoma in right eye/ new furnace/ people with similar HA or hx of bleeding/ injury.  Constant, gradually worsening.  Pain has spread to right upper face/ tingling.  Nothing improves but has not tried much.   Patient is a 58 y.o. female presenting with headaches. The history is provided by the patient.  Headache Associated symptoms: numbness   Associated symptoms: no abdominal pain, no back pain, no congestion, no fever, no neck pain, no neck stiffness, no photophobia and no vomiting     Past Medical History  Diagnosis Date  . Asthma   . Diabetes mellitus   . CAD (coronary artery disease)     myoview 3/13: mild ant ischemia;  LHC 4/13:  LAD < 10%, pRCA 30%, EF 55-65%  . Hypertension   . GERD (gastroesophageal reflux disease)   . Arthritis   . Chronic headaches   . Depression   . Bradycardia     2/2 beta blockers  . Sleep apnea   . Hyperlipemia    Past Surgical History  Procedure Laterality Date  . Abdominal hysterectomy    . Appendectomy     Family History  Problem Relation Age of Onset  . Colon cancer Brother   . Colon cancer Paternal Aunt   . Stomach cancer Paternal Grandmother   . Heart disease Father   . Heart disease Brother     x3  . Esophageal cancer Neg Hx   . Rectal cancer Neg Hx    History  Substance Use Topics  . Smoking status: Former Games developer  . Smokeless tobacco: Never Used  . Alcohol Use: No   OB History   Grav Para Term Preterm Abortions TAB SAB Ect Mult Living                 Review of  Systems  Constitutional: Negative for fever and chills.  HENT: Negative for congestion.   Eyes: Negative for photophobia and visual disturbance.  Respiratory: Negative for shortness of breath.   Cardiovascular: Negative for chest pain.  Gastrointestinal: Negative for vomiting and abdominal pain.  Genitourinary: Negative for dysuria and flank pain.  Musculoskeletal: Negative for back pain, neck pain and neck stiffness.  Skin: Negative for rash.  Neurological: Positive for numbness and headaches. Negative for syncope, weakness and light-headedness.    Allergies  Review of patient's allergies indicates no known allergies.  Home Medications   Current Outpatient Rx  Name  Route  Sig  Dispense  Refill  . albuterol (PROVENTIL HFA;VENTOLIN HFA) 108 (90 BASE) MCG/ACT inhaler   Inhalation   Inhale 2 puffs into the lungs every 6 (six) hours as needed for wheezing or shortness of breath. For shortness of breath.         Marland Kitchen aspirin EC 81 MG tablet   Oral   Take 81 mg by mouth every morning.          . cloNIDine (CATAPRES) 0.2 MG tablet   Oral   Take 0.2 mg by mouth 2 (two) times daily.         Marland Kitchen  dexlansoprazole (DEXILANT) 60 MG capsule   Oral   Take 60 mg by mouth every morning.          . furosemide (LASIX) 40 MG tablet   Oral   Take 40 mg by mouth every morning.          . gabapentin (NEURONTIN) 100 MG capsule   Oral   Take 100 mg by mouth 3 (three) times daily.         . hydrOXYzine (ATARAX/VISTARIL) 25 MG tablet   Oral   Take 25 mg by mouth at bedtime.         Marland Kitchen lisinopril (PRINIVIL,ZESTRIL) 40 MG tablet   Oral   Take 40 mg by mouth every morning.          . metFORMIN (GLUCOPHAGE) 500 MG tablet   Oral   Take 500 mg by mouth daily with breakfast.          . potassium chloride (K-DUR,KLOR-CON) 10 MEQ tablet   Oral   Take 10 mEq by mouth 2 (two) times daily.         . pravastatin (PRAVACHOL) 20 MG tablet   Oral   Take 20 mg by mouth every  evening.         . nitroGLYCERIN (NITROSTAT) 0.4 MG SL tablet   Sublingual   Place 0.4 mg under the tongue every 5 (five) minutes as needed for chest pain.          BP 175/87  Pulse 79  Temp(Src) 98.7 F (37.1 C) (Oral)  Resp 20  SpO2 98% Physical Exam  Nursing note and vitals reviewed. Constitutional: She is oriented to person, place, and time. She appears well-developed and well-nourished.  HENT:  Head: Normocephalic and atraumatic.  No signs of acute glaucoma clinically (pt denies eye pain or visions changes) Right temple tender, no warmth or erythema  Eyes: Conjunctivae are normal. Right eye exhibits no discharge. Left eye exhibits no discharge.  Neck: Normal range of motion. Neck supple. No tracheal deviation present.  Cardiovascular: Normal rate and regular rhythm.   Pulmonary/Chest: Effort normal and breath sounds normal.  Abdominal: Soft. She exhibits no distension. There is no tenderness. There is no guarding.  Musculoskeletal: She exhibits tenderness (right temple). She exhibits no edema.  Neurological: She is alert and oriented to person, place, and time. No cranial nerve deficit.  5+ strength in UE and LE with f/e at major joints. Sensation to palpation intact in UE and LE. CNs 2-12 grossly intact.  EOMFI.  PERRL.   Finger nose and coordination intact bilateral.   Visual fields intact to finger testing.   Skin: Skin is warm. No rash noted.  Psychiatric: She has a normal mood and affect.    ED Course  Procedures (including critical care time) Labs Review Labs Reviewed  SEDIMENTATION RATE - Abnormal; Notable for the following:    Sed Rate 35 (*)    All other components within normal limits  CBC WITH DIFFERENTIAL - Abnormal; Notable for the following:    Hemoglobin 11.8 (*)    HCT 35.5 (*)    All other components within normal limits  BASIC METABOLIC PANEL - Abnormal; Notable for the following:    Glucose, Bld 146 (*)    All other components within  normal limits  C-REACTIVE PROTEIN   Imaging Review Ct Head Wo Contrast  04/08/2013   CLINICAL DATA:  Right temporal headache.  EXAM: CT HEAD WITHOUT CONTRAST  TECHNIQUE: Contiguous axial images were  obtained from the base of the skull through the vertex without intravenous contrast.  COMPARISON:  CT of the head from 05/09/2011, and MRI of the brain performed 05/10/2011  FINDINGS: There is no evidence of acute infarction, mass lesion, or intra- or extra-axial hemorrhage on CT.  The posterior fossa, including the cerebellum, brainstem and fourth ventricle, is within normal limits. The third and lateral ventricles, and basal ganglia are unremarkable in appearance. The cerebral hemispheres are symmetric in appearance, with normal gray-white differentiation. No mass effect or midline shift is seen.  There is no evidence of fracture; visualized osseous structures are unremarkable in appearance. The orbits are within normal limits. The paranasal sinuses and mastoid air cells are well-aerated. No significant soft tissue abnormalities are seen.  IMPRESSION: No acute intracranial pathology seen on CT. Known white matter changes are less well characterized on the current study.   Electronically Signed   By: Roanna Raider M.D.   On: 04/08/2013 01:36    EKG Interpretation   None       MDM   1. Right temporal headache    Concern for trigeminal neuralgia vs temporal arteritis vs less likely bleed.  No signs of meningitis or infection or SAH. No HPI concerns for trauma or CO.  No signs of stroke, normal neuro. SED rate, labs, pain meds, CT head. Normal neuro exam. Pt improved in ED, SED rate on ly mild elevated, similar to previous.   Stressed close fup with pcp and specialist for further eval.  Results and differential diagnosis were discussed with the patient. Close follow up outpatient was discussed, patient comfortable with the plan.   Diagnosis: above     Enid Skeens, MD 04/08/13 [id number]

## 2013-04-08 NOTE — ED Notes (Signed)
Pt ambulated self to bathroom. Transported via stretcher to CT.

## 2013-06-11 ENCOUNTER — Emergency Department (HOSPITAL_COMMUNITY): Payer: No Typology Code available for payment source

## 2013-06-11 ENCOUNTER — Encounter (HOSPITAL_COMMUNITY): Payer: Self-pay | Admitting: Emergency Medicine

## 2013-06-11 ENCOUNTER — Emergency Department (HOSPITAL_COMMUNITY)
Admission: EM | Admit: 2013-06-11 | Discharge: 2013-06-11 | Disposition: A | Discharge status: Home / Self Care | Payer: No Typology Code available for payment source | Attending: Emergency Medicine | Admitting: Emergency Medicine

## 2013-06-11 DIAGNOSIS — M25562 Pain in left knee: Secondary | ICD-10-CM

## 2013-06-11 DIAGNOSIS — M129 Arthropathy, unspecified: Secondary | ICD-10-CM | POA: Insufficient documentation

## 2013-06-11 DIAGNOSIS — M79622 Pain in left upper arm: Secondary | ICD-10-CM

## 2013-06-11 DIAGNOSIS — M79609 Pain in unspecified limb: Secondary | ICD-10-CM

## 2013-06-11 DIAGNOSIS — J45909 Unspecified asthma, uncomplicated: Secondary | ICD-10-CM | POA: Insufficient documentation

## 2013-06-11 DIAGNOSIS — Z7982 Long term (current) use of aspirin: Secondary | ICD-10-CM | POA: Insufficient documentation

## 2013-06-11 DIAGNOSIS — M7989 Other specified soft tissue disorders: Secondary | ICD-10-CM

## 2013-06-11 DIAGNOSIS — Z87891 Personal history of nicotine dependence: Secondary | ICD-10-CM | POA: Insufficient documentation

## 2013-06-11 DIAGNOSIS — I2510 Atherosclerotic heart disease of native coronary artery without angina pectoris: Secondary | ICD-10-CM | POA: Insufficient documentation

## 2013-06-11 DIAGNOSIS — M1990 Unspecified osteoarthritis, unspecified site: Secondary | ICD-10-CM

## 2013-06-11 DIAGNOSIS — E785 Hyperlipidemia, unspecified: Secondary | ICD-10-CM | POA: Insufficient documentation

## 2013-06-11 DIAGNOSIS — Z79899 Other long term (current) drug therapy: Secondary | ICD-10-CM | POA: Insufficient documentation

## 2013-06-11 DIAGNOSIS — F3289 Other specified depressive episodes: Secondary | ICD-10-CM | POA: Insufficient documentation

## 2013-06-11 DIAGNOSIS — E119 Type 2 diabetes mellitus without complications: Secondary | ICD-10-CM | POA: Insufficient documentation

## 2013-06-11 DIAGNOSIS — M25529 Pain in unspecified elbow: Secondary | ICD-10-CM | POA: Insufficient documentation

## 2013-06-11 DIAGNOSIS — G8929 Other chronic pain: Secondary | ICD-10-CM | POA: Insufficient documentation

## 2013-06-11 DIAGNOSIS — M549 Dorsalgia, unspecified: Secondary | ICD-10-CM | POA: Insufficient documentation

## 2013-06-11 DIAGNOSIS — I10 Essential (primary) hypertension: Secondary | ICD-10-CM | POA: Insufficient documentation

## 2013-06-11 DIAGNOSIS — M25559 Pain in unspecified hip: Secondary | ICD-10-CM | POA: Insufficient documentation

## 2013-06-11 DIAGNOSIS — F329 Major depressive disorder, single episode, unspecified: Secondary | ICD-10-CM | POA: Insufficient documentation

## 2013-06-11 HISTORY — DX: Pain in left hip: M25.552

## 2013-06-11 HISTORY — DX: Pain in left elbow: M25.522

## 2013-06-11 HISTORY — DX: Other chronic pain: G89.29

## 2013-06-11 HISTORY — DX: Dorsalgia, unspecified: M54.9

## 2013-06-11 MED ORDER — ACETAMINOPHEN 325 MG PO TABS
650.0000 mg | ORAL_TABLET | Freq: Once | ORAL | Status: AC
  Administered 2013-06-11: 650 mg via ORAL
  Filled 2013-06-11: qty 2

## 2013-06-11 MED ORDER — HYDROCODONE-ACETAMINOPHEN 5-325 MG PO TABS: ORAL_TABLET | ORAL | Status: DC

## 2013-06-11 NOTE — Discharge Instructions (Signed)
°Emergency Department Resource Guide °1) Find a Doctor and Pay Out of Pocket °Although you won't have to find out who is covered by your insurance plan, it is a good idea to ask around and get recommendations. You will then need to call the office and see if the doctor you have chosen will accept you as a new patient and what types of options they offer for patients who are self-pay. Some doctors offer discounts or will set up payment plans for their patients who do not have insurance, but you will need to ask so you aren't surprised when you get to your appointment. ° °2) Contact Your Local Health Department °Not all health departments have doctors that can see patients for sick visits, but many do, so it is worth a call to see if yours does. If you don't know where your local health department is, you can check in your phone book. The CDC also has a tool to help you locate your state's health department, and many state websites also have listings of all of their local health departments. ° °3) Find a Walk-in Clinic °If your illness is not likely to be very severe or complicated, you may want to try a walk in clinic. These are popping up all over the country in pharmacies, drugstores, and shopping centers. They're usually staffed by nurse practitioners or physician assistants that have been trained to treat common illnesses and complaints. They're usually fairly quick and inexpensive. However, if you have serious medical issues or chronic medical problems, these are probably not your best option. ° °No Primary Care Doctor: °- Call [facility] at  832-8000 - they can help you locate a primary care doctor that  accepts your insurance, provides certain services, etc. °- Physician Referral Service- 1-800-533-3463 ° °Chronic Pain Problems: °Organization         Address  Phone   Notes  °[facility]  ([phone]) [phone] Patients need to be referred by their primary care doctor.  ° °Medication  Assistance: °Organization         Address  Phone   Notes  °[place] [facility] [address] °[place], [place] [address] (336) 641-8030 --Must be a resident of [place] °-- Must have NO insurance coverage whatsoever (no Medicaid/ Medicare, etc.) °-- The pt. MUST have a primary care doctor that directs their care regularly and follows them in the community °  °[facility]  (866) 331-1348   °[employer]  (888) 892-1162   ° °Agencies that provide inexpensive medical care: °Organization         Address  Phone   Notes  °[facility]  (336) 832-8035   °[facility]    (336) 832-7272   °[facility] [address] °[place], [place] [address] (336) 832-4777   °[facility] [address], °[place] (336) 271-4999   °[facility]    (336) 373-0678   °[facility]    (336) 272-1050   °[facility] ° [address], [place] Phone:  (336) 832-4444, Fax:  (336) 832-4440 Hours of Operation:  9 am - 6 pm, M-F.  Also accepts Medicaid/Medicare and self-pay.  °[facility] ° [address], [place] Phone: (336) [phone], Fax: (336) [phone]. Hours of Operation:  8:30 am - 5:30 pm, M-F.  Also accepts Medicaid and self-pay.  °[facility] [place] [address], [place] Phone: (336) 878-6027   °[facility] [address], [place], [place] (336)723-1848, Ext. 123 Mondays & Thursdays: 7-9 AM.  First 15 patients are seen on a first come, first serve basis. °  ° °Medicaid-accepting [place] Providers: ° °Organization         Address  Phone   Notes  °[facility] [address], [place] ([phone]) [phone] Also accepts self-pay patients.  °[facility] [address], [place] ° (336) [phone]   °[facility] [address], [place]  (336) 288-8857   °[facility] [address] [place] [address], [place] (336) 299-7000   °Veita Bland [address], [place]  ° (336) [phone] Only accepts [place] Access Medicaid patients after they have their name applied to their card.  ° °Self-Pay (no insurance) in [place]: ° °Organization         Address  Phone   Notes  °Sickle Cell Patients, [facility] [address], [place] (336) 832-1970   °[facility] [address], [place] (336) 832-4400   °[facility] ° [address], [place] (336) [phone]   °[facility]/Dr. Osei-Bonsu ° [address] [place] [address], [place] or [address], [place] (336) [phone] Phone number for both [place] and [place] locations is the same.  °[facility] [address], [place] (336) 299-0000   °[facility] [address] [place] [address], [place] or [address] (336) 852-7530 °(336) 878-2260   °[facility] [address], [place] (336) 350-1642, phone; (336) 294-5005, fax Sees patients 1st and 3rd Saturday of every month.  Must not qualify for public or private insurance (i.e. Medicaid, Medicare, [place] Health Choice, Veterans' Benefits) • Household income should be no more than 200% of the poverty level •The clinic cannot treat you if you are pregnant or think you are pregnant • Sexually transmitted diseases are not treated at the clinic.  ° ° °Dental Care: °Organization         Address  Phone  Notes  °[place] [facility] [address], [place] (336) 641-6152 Accepts children up to age 21 who are enrolled in Medicaid or [place] Health Choice; pregnant women with a Medicaid card; and children who have applied for Medicaid or [place] Health Choice, but were declined, whose parents can pay a reduced fee at time of service.  °[place]  [facility] [place]  [address], [place] (336) 641-7733 Accepts children up to age 21 who are enrolled in Medicaid or [place] Health Choice; pregnant women with a Medicaid card; and children who have applied for Medicaid or [place] Health Choice, but were declined, whose parents can pay a reduced fee at time of service.  °[facility] ° [address], [place] (336) 641-4533 Patients are seen by appointment only. Walk-ins are not accepted. [facility] will see patients 18 years of age and older. °Monday - Tuesday (8am-5pm) °Most Wednesdays (8:30-5pm) °$30 per visit, cash only  °[facility] ° [address], [place] (336) [phone] Patients are seen by appointment only. Walk-ins are not accepted. [facility] will see patients 18 years of age and older. °One   Wednesday Evening (Monthly: Volunteer Based).  $30 per visit, cash only  [employer] [facility]  (332) 419-8891 for adults; Children under age 54, call Graduate Pediatric Dentistry at ([phone]. Children aged 88-14, please call [phone] to request a pediatric application.  Dental services are provided in all areas of dental care including fillings, crowns and bridges, complete and partial dentures, implants, gum treatment, root canals, and extractions. Preventive care is also provided. Treatment is provided to both adults and children. Patients are selected via a lottery and there is often a waiting list.   [facility] [address], [place]  [phone] [email or web address]   [facility] [address] [place], [place] [phone], Ext. [phone] Second and Fourth Thursday of each month, opens at 6:30 AM; Clinic ends at 9 AM.  Patients are seen on a first-come first-served basis, and a limited number are seen during each clinic.   [facility]  [address] [place], [place] [phone]    Eligibility Requirements You must have lived in [place], [place], or [place] counties for at least the last three months.   You cannot be eligible for state or federal sponsored [place], including [employer], [place], or [employer].   You generally cannot be eligible for healthcare insurance through your employer.    How to apply: Eligibility screenings are held every Tuesday and Wednesday afternoon from 1:00 pm until 4:00 pm. You do not need an appointment for the interview!  [facility] [address] [place], [place] [phone]   [facility] Health Department  (628)[phone]   [facility] Health Department  [phone]   [facility] Department  [phone]    Behavioral Health Resources in the Community: Intensive Outpatient Programs Organization         Address  Phone  Notes  [facility] [address], [place], [place] [phone]   [facility] [address] [place], [place] [phone]   [facility] [address] [place], [place]  [phone]   [facility] [address]  [place], [place] [phone] or [phone]   Substance Abuse Resources Organization         Address  Phone  Notes  [facility]  (647)144-6026   [facility]  (838)241-2006   [facility]  212-194-5688   Floydene Flock  817-087-5439   Residential & Outpatient Substance Abuse Program  [phone]   Psychological Services Organization         Address  Phone  Notes  [facility]  336(214) 765-1227   [facility]  (402)689-9762   [facility] [address] [place] [phone] or [phone]    Mobile Crisis Teams Organization         Address  Phone  Notes  [facility], Mobile Crisis Care Unit  [phone]   [facility]  [address]  [place], [place] [phone]   Doristine Locks [address] [place] [phone]    Self-Help/Support Groups Organization         Address  Phone             Notes  [facility] [place] - variety of support groups  336- I7437963 Call for more information  Narcotics Anonymous (NA), [facility] [address], [employer] [place]  2 meetings at this location  Residential Treatment Programs Organization         Address  Phone  Notes  [facility] [address]    [place]  [phone]   [facility]  [address] [place] [address], [place], [place] [phone]   [facility] [address] [place], [place] [phone] Admissions: 8am-3pm M-F  [facility] [address],    [place], [place] [phone]   [facility] [address] [place], [place], [place] [phone]   [facility] [address],  [place], [place] [phone]   [facility] - Intensive Outpatient [address], [place], [place] [phone]   [facility] ([facility] [address]  [place], [place] [phone] or 9171435625   [facility] ([facility]) [address], [place], [place] [phone] Accepts Medicaid  [facility] [address]  [place] [phone] Substance Abuse/Addiction Treatment   [facility] Organization         Address  Phone  Notes  [facility]  [phone]   Angie Fava, PhD [address] [place], [place]   [phone] or [phone]   [facility] Behavioral   [address] [place], [place] ([phone]   [facility] [address] [place], [place] [phone] Insurance/Medicaid/sponsorship through [facility] [address]                                    [place], [place] [phone] Therapy/tele-psych/case    [facility] [address], [place] [phone]    Dr. Lolly Mustache  [phone]   [facility]  [facility] Dept. 1) [address] [place] 2) [address], [place] 3)  [address], [place] [phone]  [phone]   [facility] Behavioral Child Abuse Hotline (959)255-7485 or (339)667-5856 (After Hours)       Take the prescription as directed.  Apply moist heat or ice to the area(s) of discomfort, for 15 minutes at a time, several times per day for the next few days.  Do not fall asleep on a heating or ice pack.  Call your regular medical doctor tomorrow to schedule a follow up appointment within the next 2 days.  Return to the Emergency Department immediately if worsening.

## 2013-06-11 NOTE — ED Provider Notes (Signed)
CSN: [id number]     Arrival date & time 06/11/13  0711 History   First MD Initiated Contact with Patient 06/11/13 [id number]     Chief Complaint  Patient presents with  . Arm Pain  . Knee Pain      HPI Pt was seen at 0740. Per pt, c/o gradual onset and persistence of constant left upper lateral arm "pain" for the past several months. Pt states the pain worsens with palpation of the area and movement. Pt also c/o her left ring finger "gets stuck sometimes" when she flexes her fingers for the past several months. Pt has not been evaluated by her PMD for same. Pt also c/o gradual onset and persistence of constant left knee "pain" for the past 2 days. States her knee "just hurts." Denies injury, no calf pain, no focal motor weakness, no tingling/numbness in extremities, no CP/SOB, no abd pain, no fevers, no rash, no neck or back pain.    Past Medical History  Diagnosis Date  . Asthma   . Diabetes mellitus   . CAD (coronary artery disease)     myoview 3/13: mild ant ischemia;  LHC 4/13:  LAD < 10%, pRCA 30%, EF 55-65%  . Hypertension   . GERD (gastroesophageal reflux disease)   . Arthritis   . Chronic headaches   . Depression   . Bradycardia     2/2 beta blockers  . Sleep apnea   . Hyperlipemia   . Chronic back pain   . Chronic left hip pain   . Chronic pain of left elbow    Past Surgical History  Procedure Laterality Date  . Abdominal hysterectomy    . Appendectomy    . Cardiac catheterization  2013    minimal plaque   Family History  Problem Relation Age of Onset  . Colon cancer Brother   . Colon cancer Paternal Aunt   . Stomach cancer Paternal Grandmother   . Heart disease Father   . Heart disease Brother     x3  . Esophageal cancer Neg Hx   . Rectal cancer Neg Hx    History  Substance Use Topics  . Smoking status: Former Games developer  . Smokeless tobacco: Never Used  . Alcohol Use: No    Review of Systems ROS: Statement: All systems negative except as marked or noted in  the HPI; Constitutional: Negative for fever and chills. ; ; Eyes: Negative for eye pain, redness and discharge. ; ; ENMT: Negative for ear pain, hoarseness, nasal congestion, sinus pressure and sore throat. ; ; Cardiovascular: Negative for chest pain, palpitations, diaphoresis, dyspnea and peripheral edema. ; ; Respiratory: Negative for cough, wheezing and stridor. ; ; Gastrointestinal: Negative for nausea, vomiting, diarrhea, abdominal pain, blood in stool, hematemesis, jaundice and rectal bleeding. . ; ; Genitourinary: Negative for dysuria, flank pain and hematuria. ; ; Musculoskeletal: +left upper outer arm pain, left knee pain, left ring finger "gets stuck." Negative for back pain and neck pain. Negative for swelling and trauma.; ; Skin: Negative for pruritus, rash, abrasions, blisters, bruising and skin lesion.; ; Neuro: Negative for headache, lightheadedness and neck stiffness. Negative for weakness, altered level of consciousness , altered mental status, extremity weakness, paresthesias, involuntary movement, seizure and syncope.     Allergies  Review of patient's allergies indicates no known allergies.  Home Medications   Current Outpatient Rx  Name  Route  Sig  Dispense  Refill  . aspirin EC 81 MG tablet   Oral  Take 81 mg by mouth every morning.          . cloNIDine (CATAPRES) 0.2 MG tablet   Oral   Take 0.2 mg by mouth 2 (two) times daily.         Marland Kitchen dexlansoprazole (DEXILANT) 60 MG capsule   Oral   Take 60 mg by mouth every morning.          . furosemide (LASIX) 40 MG tablet   Oral   Take 40 mg by mouth every morning.          . gabapentin (NEURONTIN) 300 MG capsule   Oral   Take 300 mg by mouth 2 (two) times daily.         . hydrOXYzine (ATARAX/VISTARIL) 25 MG tablet   Oral   Take 25 mg by mouth at bedtime.         Marland Kitchen lisinopril (PRINIVIL,ZESTRIL) 40 MG tablet   Oral   Take 40 mg by mouth every morning.          . metFORMIN (GLUCOPHAGE) 500 MG tablet    Oral   Take 500 mg by mouth daily with breakfast.          . potassium chloride (K-DUR,KLOR-CON) 10 MEQ tablet   Oral   Take 10 mEq by mouth 2 (two) times daily.         . pravastatin (PRAVACHOL) 20 MG tablet   Oral   Take 20 mg by mouth every evening.         . nitroGLYCERIN (NITROSTAT) 0.4 MG SL tablet   Sublingual   Place 0.4 mg under the tongue every 5 (five) minutes as needed for chest pain.          BP 174/82  Pulse 69  Temp(Src) 98.1 F (36.7 C) (Oral)  Resp 18  SpO2 99% Physical Exam 0745: Physical examination:  Nursing notes reviewed; Vital signs and O2 SAT reviewed;  Constitutional: Well developed, Well nourished, Well hydrated, In no acute distress; Head:  Normocephalic, atraumatic; Eyes: EOMI, PERRL, No scleral icterus; ENMT: Mouth and pharynx normal, Mucous membranes moist; Neck: Supple, Full range of motion, No lymphadenopathy; Cardiovascular: Regular rate and rhythm, No murmur, rub, or gallop; Respiratory: Breath sounds clear & equal bilaterally, No rales, rhonchi, wheezes.  Speaking full sentences with ease, Normal respiratory effort/excursion; Chest: Nontender, Movement normal; Abdomen: Soft, Nontender, Nondistended, Normal bowel sounds; Genitourinary: No CVA tenderness; Spine:  No midline CS, TS, LS tenderness. No rash.;; Extremities: Pulses normal, +mild TTP left deltoid and triceps areas without rash, no soft tissue crepitus, no deformity. NT left shoulder/elbow/wrist/hand, strong radial pulse, no rash, no edema. LUE muscles compartments soft. +FROM left knee, including able to lift extended LLE against gravity, and extend left lower leg against resistance.  No ligamentous laxity.  No patellar or quad tendon step-offs.  NMS intact left foot, strong pedal pp. +plantarflexion of left foot w/calf squeeze.  No palpable gap left Achilles's tendon.  No proximal fibular head tenderness.  No edema, erythema, warmth, ecchymosis or deformity.  No specific area of point  tenderness. NT left hip/ankle/foot. No deformity. No edema, No calf edema or asymmetry.; Neuro: AA&Ox3, Major CN grossly intact.  Speech clear. No gross focal motor or sensory deficits in extremities. Climbs on and off stretcher easily by herself. Gait steady.; Skin: Color normal, Warm, Dry.   ED Course  Procedures     EKG Interpretation None      MDM  MDM Reviewed: previous chart, nursing note  and vitals Interpretation: x-ray and ultrasound     Dg Shoulder Left 06/11/2013   CLINICAL DATA:  Left shoulder pain  EXAM: LEFT SHOULDER - 2+ VIEW  COMPARISON:  None.  FINDINGS: No fracture or dislocation is seen.  Mild degenerative changes of the acromioclavicular joint.  The visualized soft tissues are unremarkable.  Visualized left lung is clear.  IMPRESSION: Mild degenerative changes.   Electronically Signed   By: Charline Bills M.D.   On: 06/11/2013 08:54   Dg Knee Complete 4 Views Left 06/11/2013   CLINICAL DATA:  Knee pain  EXAM: LEFT KNEE - COMPLETE 4+ VIEW  COMPARISON:  None.  FINDINGS: No acute fracture. No dislocation. Mild degenerative change. Nonspecific calcified matrix in the distal diaphysis.  IMPRESSION: No acute bony pathology.   Electronically Signed   By: Maryclare Bean M.D.   On: 06/11/2013 08:55   Dg Hand Complete Left 06/11/2013   CLINICAL DATA:  Left hand swelling and tingling. No history of trauma.  EXAM: LEFT HAND - COMPLETE 3+ VIEW  COMPARISON:  None.  FINDINGS: Mild osteopenia. No acute fracture. No dislocation. Mild degenerative change of the IP joints.  IMPRESSION: No acute bony pathology.   Electronically Signed   By: Maryclare Bean M.D.   On: 06/11/2013 08:51    VASCULAR LAB  PRELIMINARY PRELIMINARY PRELIMINARY PRELIMINARY  Left lower extremity venous Doppler completed.  Preliminary report: There is no DVT or SVT noted in the left lower extremity.  KANADY, CANDACE, RVT  06/11/2013, 10:59 AM    1120:  Workup reassuring. Will tx symptomatically at this time. Pt states  she is ready to go home now. Dx and testing d/w pt and family.  Questions answered.  Verb understanding, agreeable to d/c home with outpt f/u.    Laray Anger, DO 06/14/13 1133

## 2013-06-11 NOTE — ED Notes (Signed)
Pt reports that she has tightness and swelling in L leg behind knee x1 day and L shoulder pain x 1 month. Pt denies injury for both. Pt is A&O and in NAD. Pt significant other at bedside

## 2013-06-11 NOTE — ED Notes (Signed)
Doppler tech called and stated she will not be here for approx 2 hrs. Pt notified of wait

## 2013-06-11 NOTE — ED Notes (Signed)
She c/o non-traumatic left arm pain "for a while".  She is unable to say exactly when it started, but she knows it has been at least a month.  She further tells me she is having non=-traumatic swelling and pain of left knee x 2 days.  She is in no distress.

## 2013-06-11 NOTE — ED Notes (Signed)
Pt states arm pain for a while and swelling in hands at times.  Now left knee also hurting.  No trauma noted.

## 2013-06-11 NOTE — Progress Notes (Signed)
VASCULAR LAB PRELIMINARY  PRELIMINARY  PRELIMINARY  PRELIMINARY  Left lower extremity venous Doppler completed.    Preliminary report:  There is no DVT or SVT noted in the left lower extremity.  Sarah Baez, RVT 06/11/2013, 10:59 AM

## 2013-10-05 IMAGING — CR DG HIP (WITH OR WITHOUT PELVIS) 2-3V*L*
3 series · 3 of 3 positions shown · non-contrast
Comparison: None.

CLINICAL DATA: Bilateral hip pain.

LEFT HIP - COMPLETE 2+ VIEW

[t pelvis a.p.]
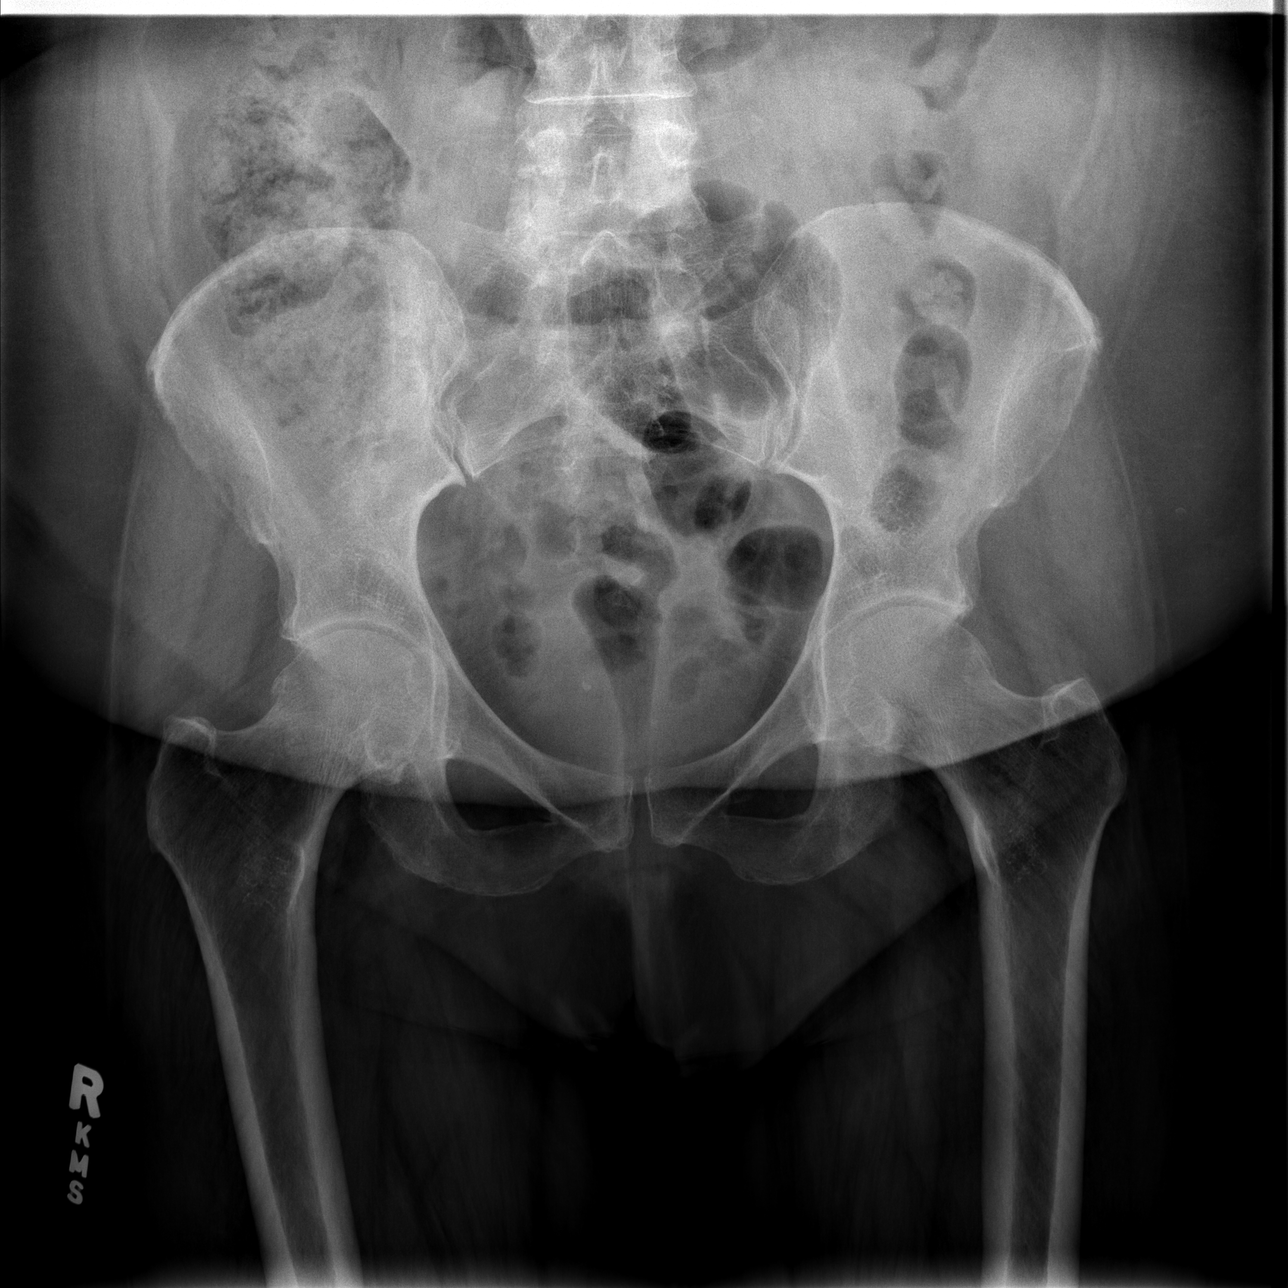

[t hip ap left]
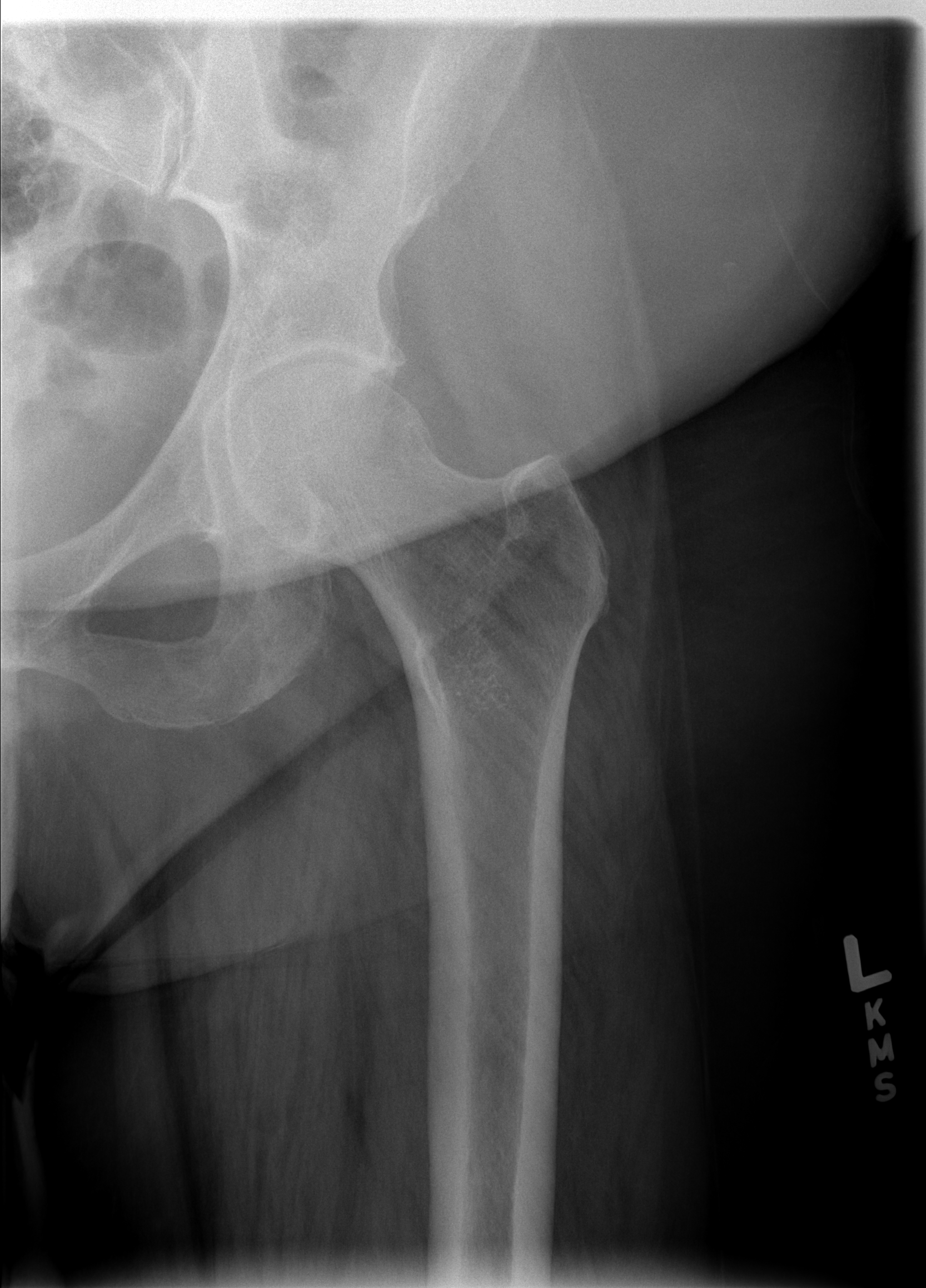

[t hip frog leg left]
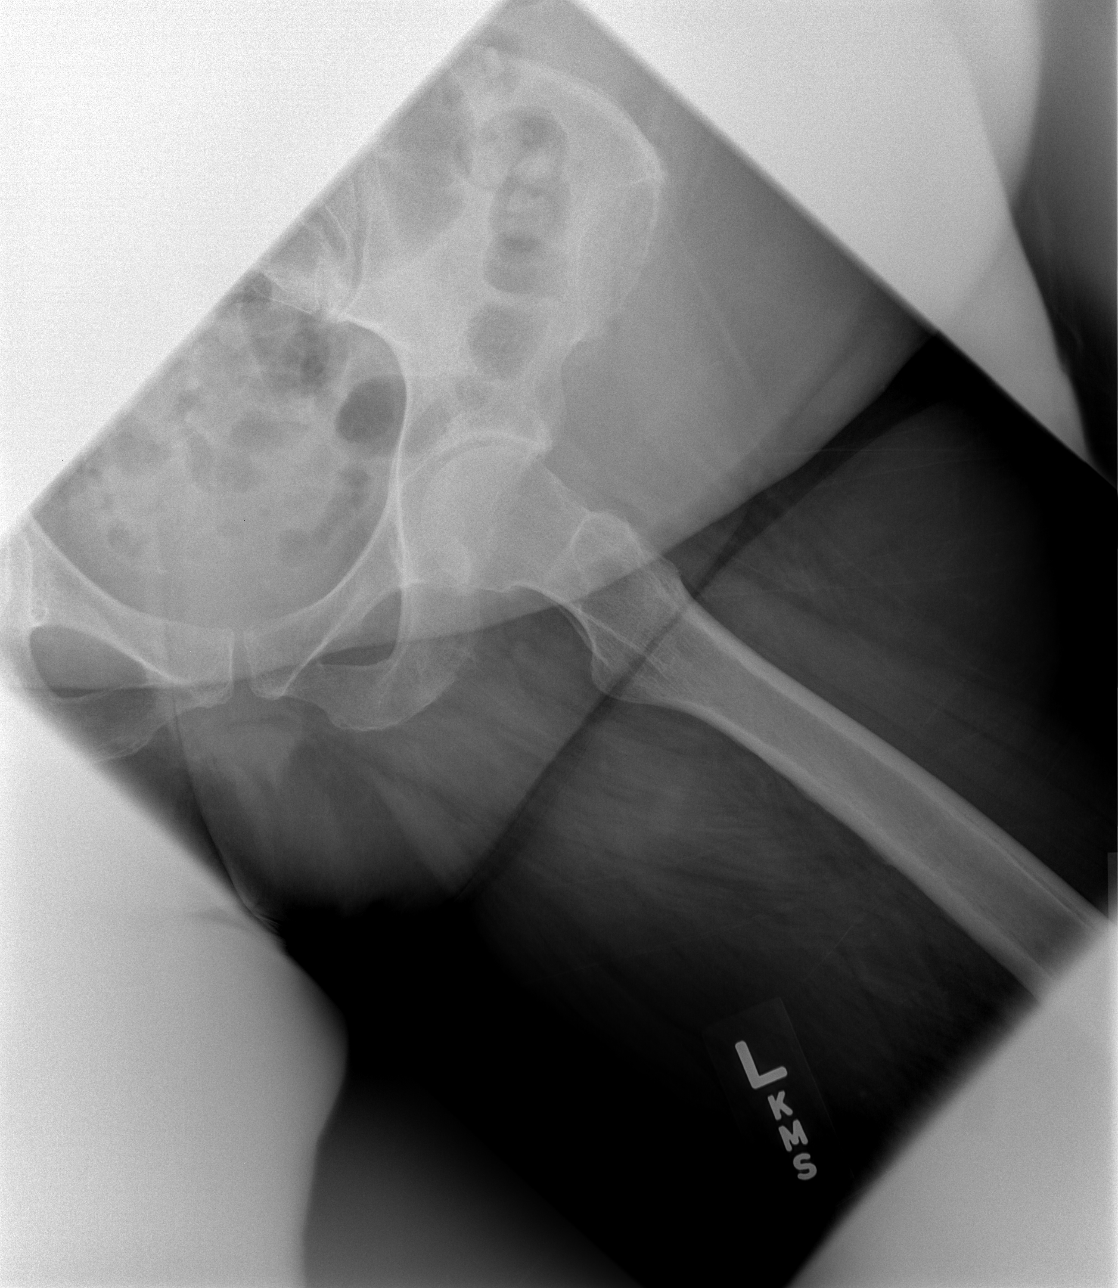

[3 of 3 positions shown; findings below may reference images not displayed]

FINDINGS: There is no evidence of fracture or dislocation.  Both
femoral heads are seated normally within their respective
acetabula.  The proximal left femur is unremarkable in appearance.
No significant degenerative change is appreciated.  The sacroiliac
joints are unremarkable in appearance.

The visualized bowel gas pattern is grossly unremarkable in
appearance.
IMPRESSION: No evidence of fracture or dislocation.

## 2013-10-05 IMAGING — CT CT HEAD W/O CM
2 series · 17 of 30 positions shown, 20 images · non-contrast
Comparison: 11/24/2010

CLINICAL DATA: Altered mental status, hypertension.

CT HEAD WITHOUT CONTRAST
TECHNIQUE: Contiguous axial images were obtained from the base of
the skull through the vertex without contrast.

[Series 2: head w/o · axial · non-contrast · 0.43mm/px · z∈[-142,-37]mm · 9 of 27 slices shown, 12 images]
[im 3/27  brain]
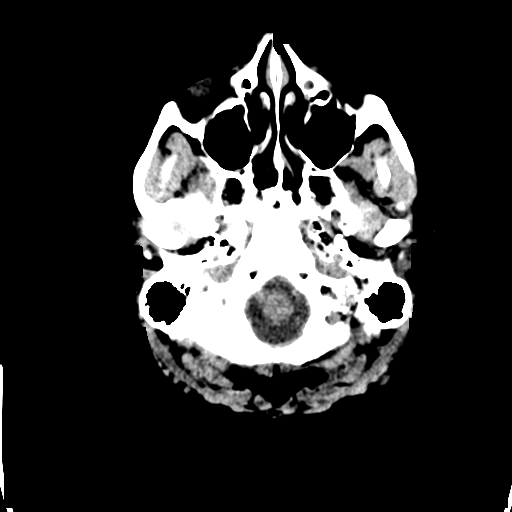
[im 3/27  bone]
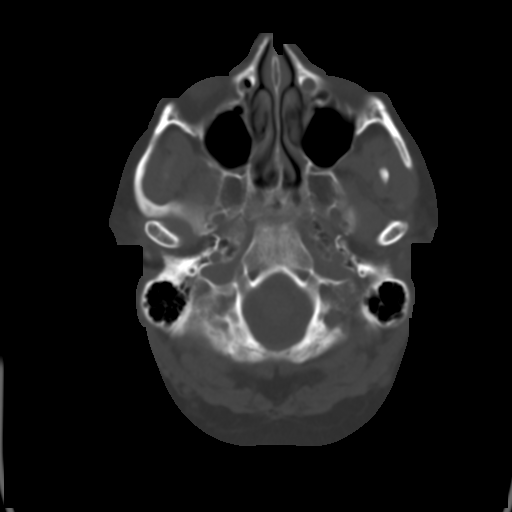
[im 6/27  brain]
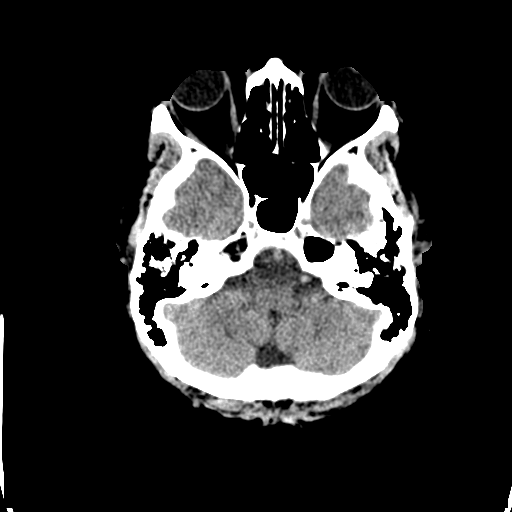
[im 8/27  brain]
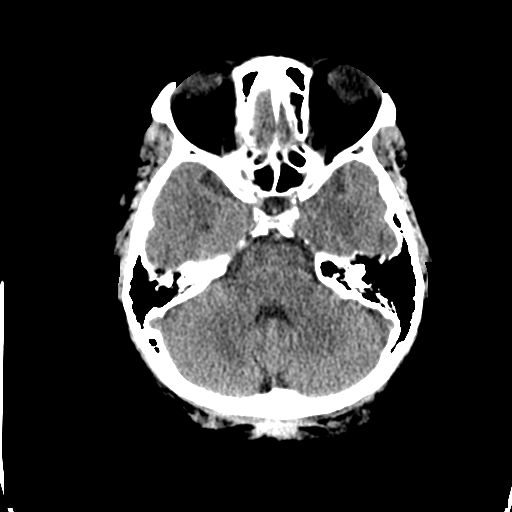
[im 11/27  brain]
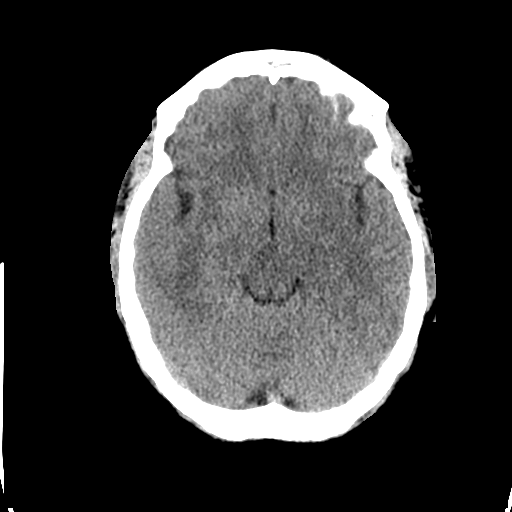
[im 14/27  brain]
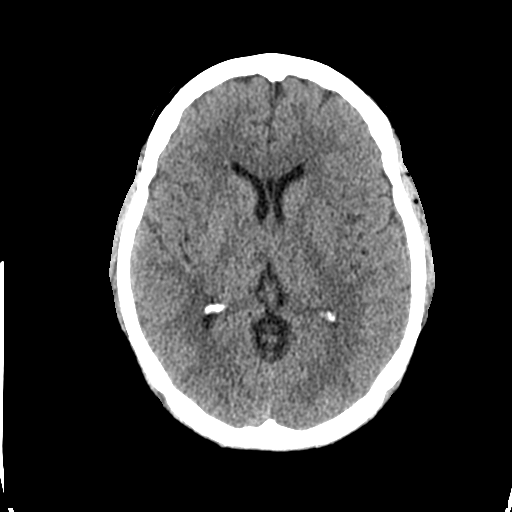
[im 14/27  bone]
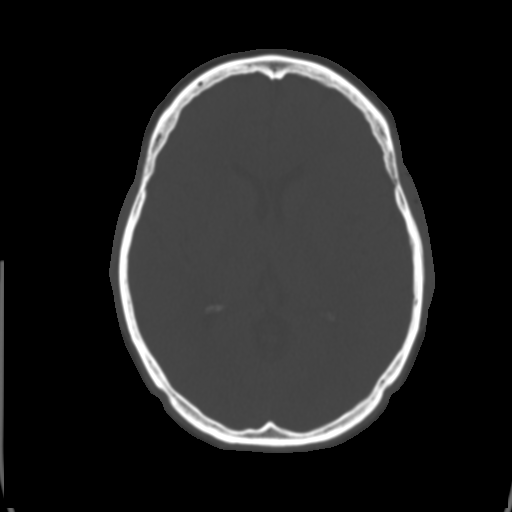
[im 16/27  brain]
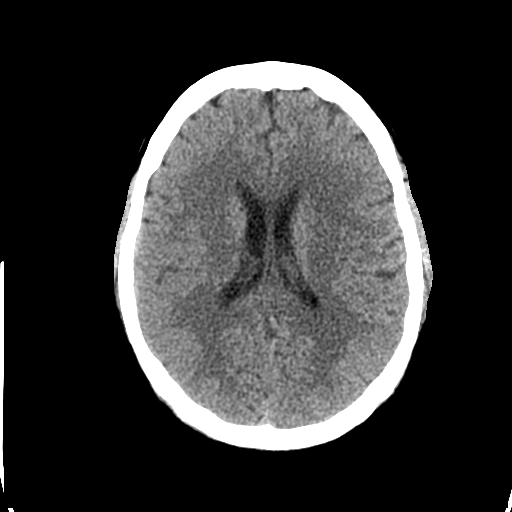
[im 19/27  brain]
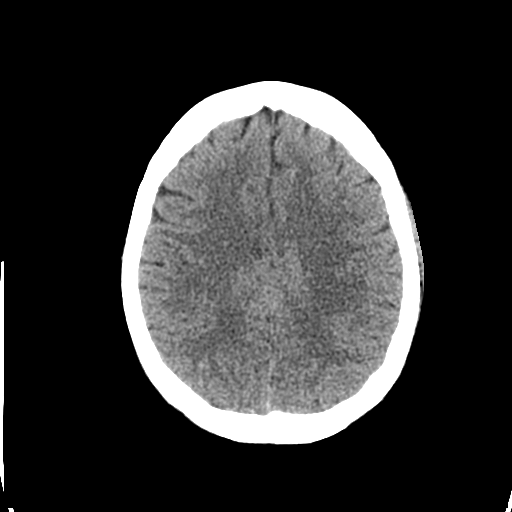
[im 21/27  brain]
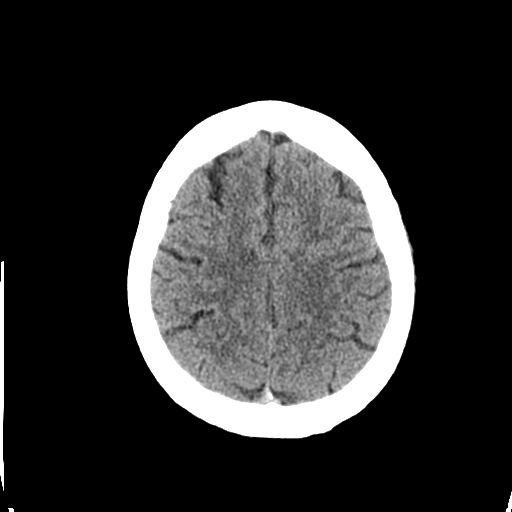
[im 24/27  brain]
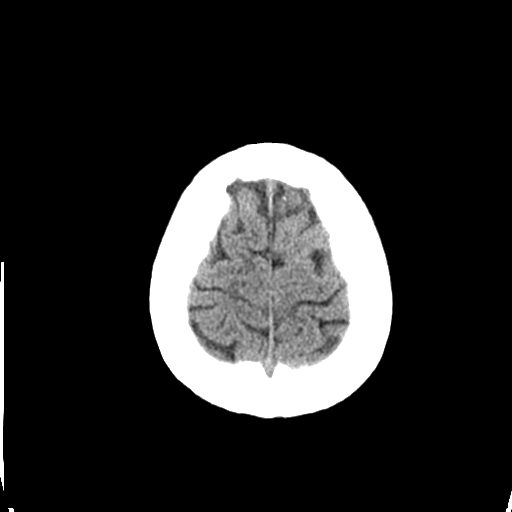
[im 24/27  bone]
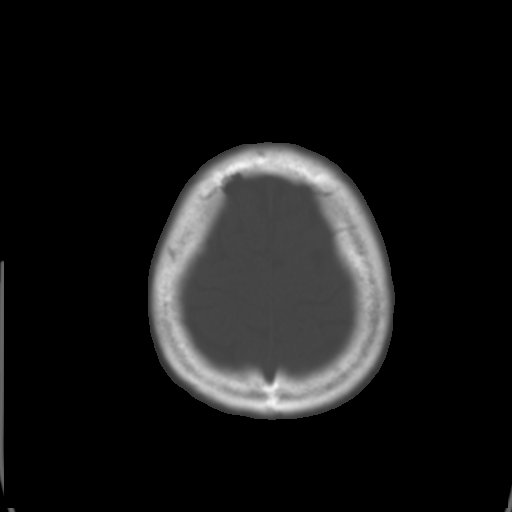

[Series 3: bone windows · axial · 0.43mm/px · z∈[-140,-35]mm · 8 of 45 slices shown]
[im 5/45  bone]
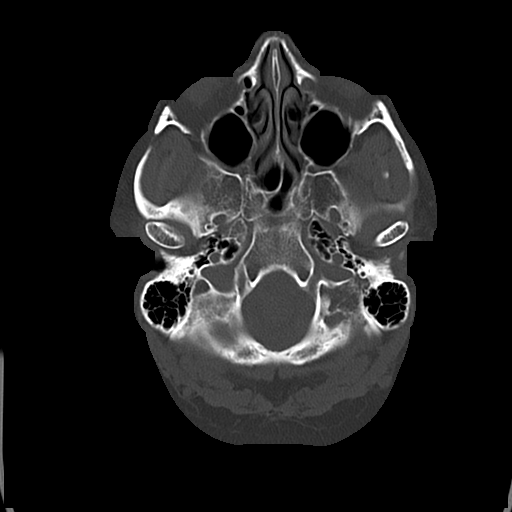
[im 10/45  bone]
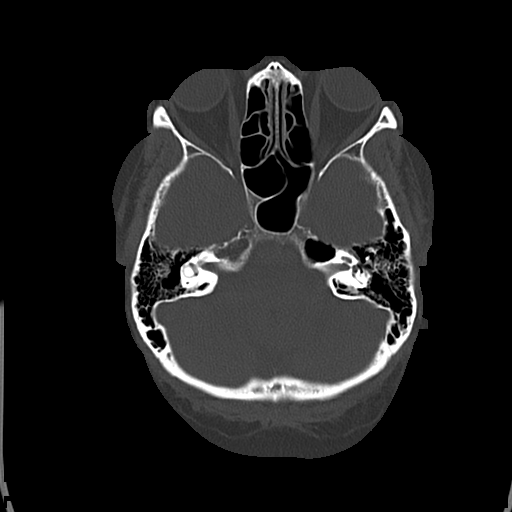
[im 15/45  bone]
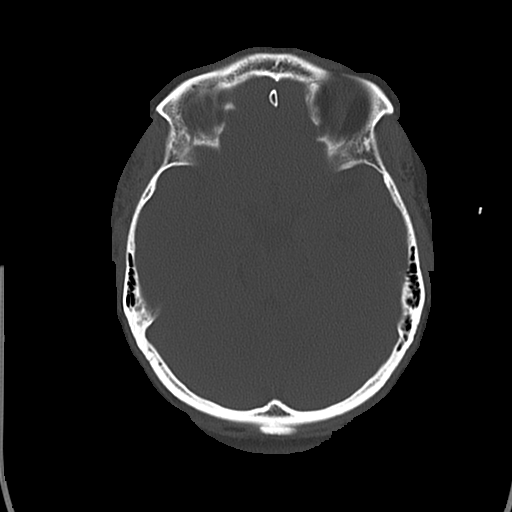
[im 20/45  bone]
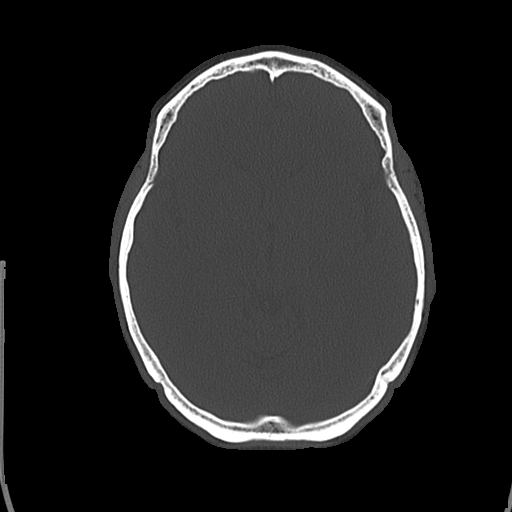
[im 25/45  bone]
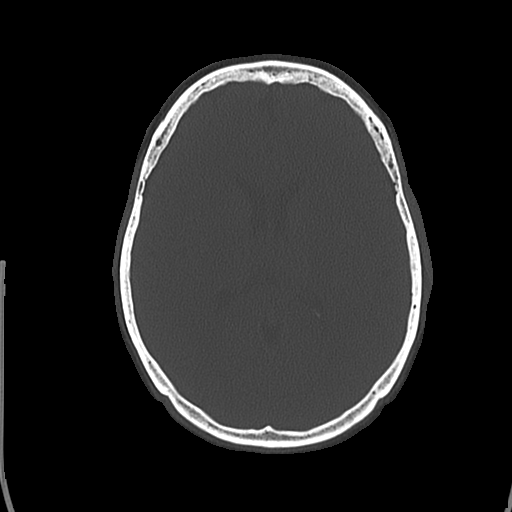
[im 30/45  bone]
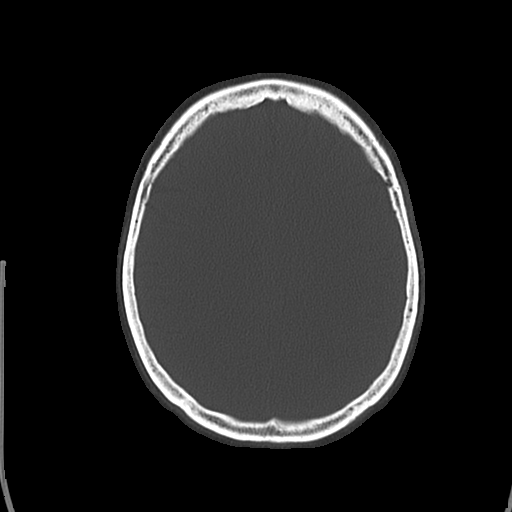
[im 35/45  bone]
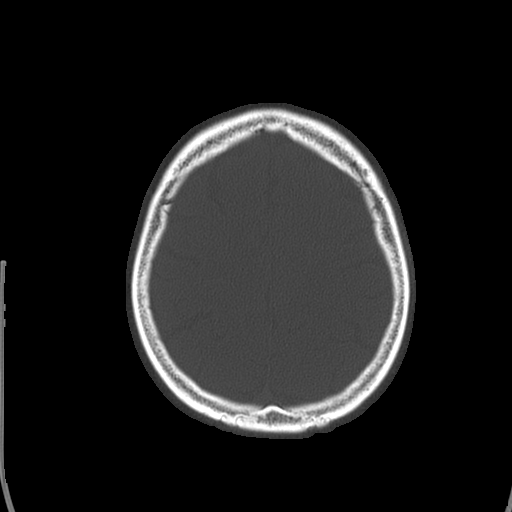
[im 40/45  bone]
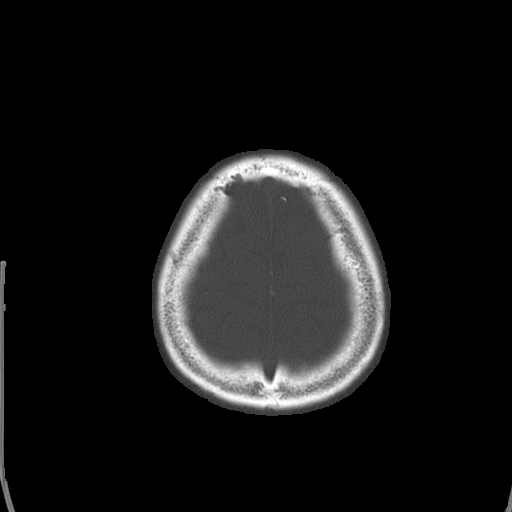

[17 of 30 positions shown; findings below may reference images not displayed]

FINDINGS: Tiny hypodensity within the right thalamus is
nonspecific. There are mild subcortical and periventricular white
matter hypodensities, a nonspecific finding most often seen with
chronic microangiopathic changes.

There is no evidence for acute hemorrhage, overt hydrocephalus,
mass lesion, or abnormal extra-axial fluid collection.  No definite
CT evidence for acute cortical based (large artery) infarction.
IMPRESSION: Mild white matter hypodensities, a nonspecific finding most often
seen with chronic microangiopathic change.  Tiny right thalamus
hypodensity may represent an age indeterminate lacunar infarction.

Otherwise, no definite acute intracranial abnormality identified.

## 2013-10-05 IMAGING — CR DG CHEST 1V PORT
1 series · 1 of 1 positions shown · non-contrast
Comparison: 12/11/2008

CLINICAL DATA: Shortness of breath and upper chest pain.

PORTABLE CHEST - 1 VIEW

[w chest pa]
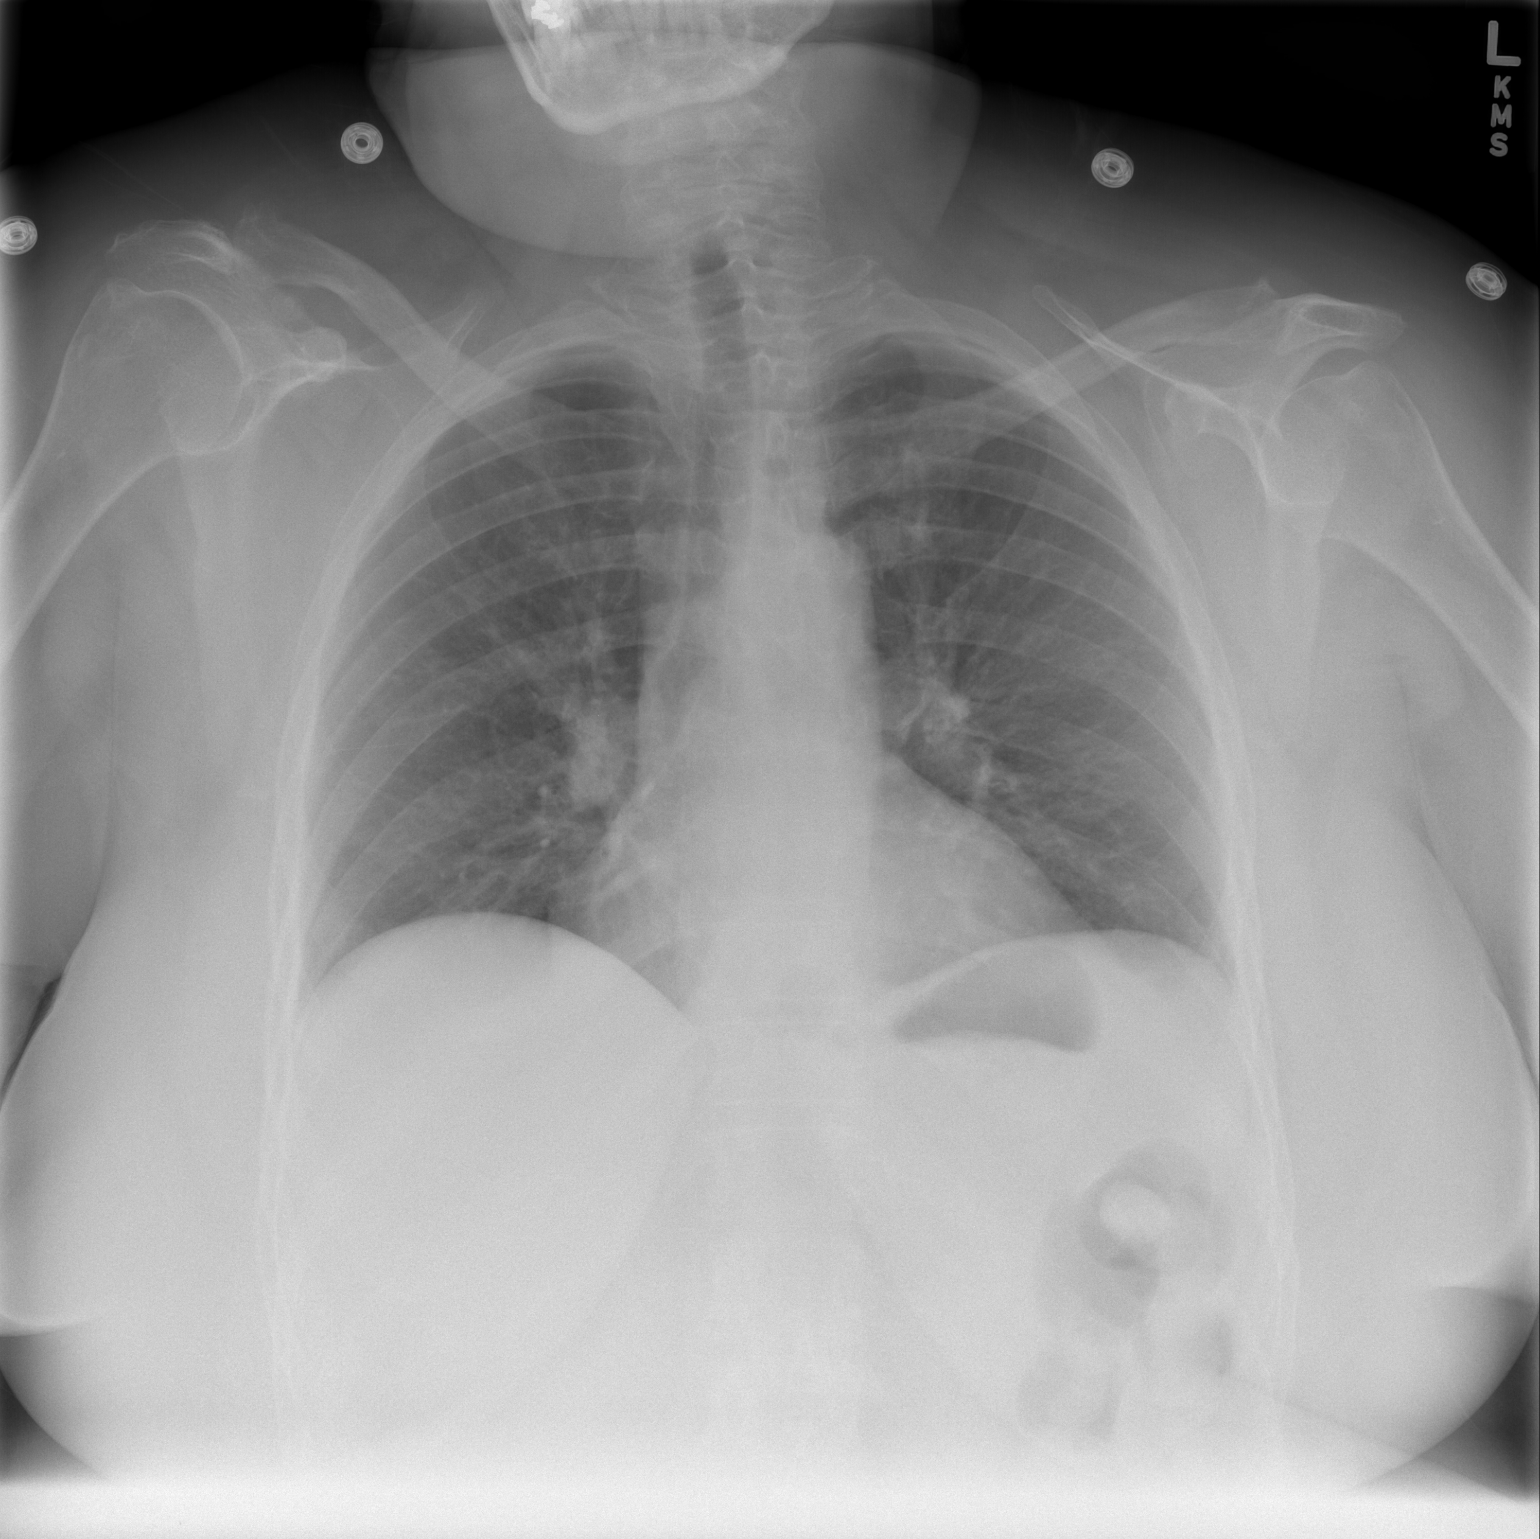

[1 of 1 positions shown; findings below may reference images not displayed]

FINDINGS: Single view of the chest demonstrates clear lungs.
Stable appearance of the heart and mediastinum.  Trachea is
midline.  Negative for a pneumothorax.
IMPRESSION: No acute chest findings.

## 2013-11-01 ENCOUNTER — Encounter (HOSPITAL_COMMUNITY): Payer: Self-pay | Admitting: Emergency Medicine

## 2013-11-01 ENCOUNTER — Emergency Department (HOSPITAL_COMMUNITY): Payer: No Typology Code available for payment source

## 2013-11-01 ENCOUNTER — Emergency Department (HOSPITAL_COMMUNITY)
Admission: EM | Admit: 2013-11-01 | Discharge: 2013-11-01 | Disposition: A | Discharge status: Home / Self Care | Payer: No Typology Code available for payment source | Attending: Emergency Medicine | Admitting: Emergency Medicine

## 2013-11-01 DIAGNOSIS — Z9889 Other specified postprocedural states: Secondary | ICD-10-CM | POA: Insufficient documentation

## 2013-11-01 DIAGNOSIS — R4182 Altered mental status, unspecified: Secondary | ICD-10-CM

## 2013-11-01 DIAGNOSIS — Z79899 Other long term (current) drug therapy: Secondary | ICD-10-CM | POA: Diagnosis not present

## 2013-11-01 DIAGNOSIS — G8929 Other chronic pain: Secondary | ICD-10-CM | POA: Diagnosis not present

## 2013-11-01 DIAGNOSIS — E119 Type 2 diabetes mellitus without complications: Secondary | ICD-10-CM | POA: Insufficient documentation

## 2013-11-01 DIAGNOSIS — E785 Hyperlipidemia, unspecified: Secondary | ICD-10-CM | POA: Insufficient documentation

## 2013-11-01 DIAGNOSIS — I2510 Atherosclerotic heart disease of native coronary artery without angina pectoris: Secondary | ICD-10-CM | POA: Insufficient documentation

## 2013-11-01 DIAGNOSIS — I498 Other specified cardiac arrhythmias: Secondary | ICD-10-CM | POA: Insufficient documentation

## 2013-11-01 DIAGNOSIS — K219 Gastro-esophageal reflux disease without esophagitis: Secondary | ICD-10-CM | POA: Insufficient documentation

## 2013-11-01 DIAGNOSIS — J45901 Unspecified asthma with (acute) exacerbation: Secondary | ICD-10-CM | POA: Insufficient documentation

## 2013-11-01 DIAGNOSIS — F3289 Other specified depressive episodes: Secondary | ICD-10-CM | POA: Diagnosis not present

## 2013-11-01 DIAGNOSIS — M129 Arthropathy, unspecified: Secondary | ICD-10-CM | POA: Insufficient documentation

## 2013-11-01 DIAGNOSIS — Z87891 Personal history of nicotine dependence: Secondary | ICD-10-CM | POA: Diagnosis not present

## 2013-11-01 DIAGNOSIS — I10 Essential (primary) hypertension: Secondary | ICD-10-CM | POA: Insufficient documentation

## 2013-11-01 DIAGNOSIS — F329 Major depressive disorder, single episode, unspecified: Secondary | ICD-10-CM | POA: Diagnosis not present

## 2013-11-01 LAB — COMPREHENSIVE METABOLIC PANEL
ALT: 23 U/L (ref 0–35)
ANION GAP: 12 (ref 5–15)
AST: 22 U/L (ref 0–37)
Albumin: 3.8 g/dL (ref 3.5–5.2)
Alkaline Phosphatase: 77 U/L (ref 39–117)
BUN: 14 mg/dL (ref 6–23)
CALCIUM: 9.6 mg/dL (ref 8.4–10.5)
CO2: 26 meq/L (ref 19–32)
CREATININE: 0.62 mg/dL (ref 0.50–1.10)
Chloride: 103 mEq/L (ref 96–112)
GLUCOSE: 102 mg/dL — AB (ref 70–99)
Potassium: 4.2 mEq/L (ref 3.7–5.3)
Sodium: 141 mEq/L (ref 137–147)
Total Bilirubin: 0.2 mg/dL — ABNORMAL LOW (ref 0.3–1.2)
Total Protein: 8.1 g/dL (ref 6.0–8.3)

## 2013-11-01 LAB — BLOOD GAS, ARTERIAL
Acid-Base Excess: 0 mmol/L (ref 0.0–2.0)
Bicarbonate: 24.5 mEq/L — ABNORMAL HIGH (ref 20.0–24.0)
DRAWN BY: 295031
O2 Saturation: 93.7 %
PCO2 ART: 41.2 mmHg (ref 35.0–45.0)
PO2 ART: 71.9 mmHg — AB (ref 80.0–100.0)
Patient temperature: 98.6
TCO2: 22.1 mmol/L (ref 0–100)
pH, Arterial: 7.391 (ref 7.350–7.450)

## 2013-11-01 LAB — CBG MONITORING, ED: GLUCOSE-CAPILLARY: 121 mg/dL — AB (ref 70–99)

## 2013-11-01 LAB — URINALYSIS, ROUTINE W REFLEX MICROSCOPIC
BILIRUBIN URINE: NEGATIVE
Glucose, UA: NEGATIVE mg/dL
HGB URINE DIPSTICK: NEGATIVE
Ketones, ur: NEGATIVE mg/dL
Leukocytes, UA: NEGATIVE
NITRITE: NEGATIVE
PROTEIN: NEGATIVE mg/dL
Specific Gravity, Urine: 1.03 (ref 1.005–1.030)
UROBILINOGEN UA: 1 mg/dL (ref 0.0–1.0)
pH: 5.5 (ref 5.0–8.0)

## 2013-11-01 LAB — DIFFERENTIAL
Basophils Absolute: 0 10*3/uL (ref 0.0–0.1)
Basophils Relative: 1 % (ref 0–1)
EOS PCT: 2 % (ref 0–5)
Eosinophils Absolute: 0.1 10*3/uL (ref 0.0–0.7)
LYMPHS PCT: 44 % (ref 12–46)
Lymphs Abs: 2.6 10*3/uL (ref 0.7–4.0)
MONO ABS: 0.3 10*3/uL (ref 0.1–1.0)
MONOS PCT: 5 % (ref 3–12)
Neutro Abs: 3 10*3/uL (ref 1.7–7.7)
Neutrophils Relative %: 48 % (ref 43–77)

## 2013-11-01 LAB — CBC
HEMATOCRIT: 40.6 % (ref 36.0–46.0)
Hemoglobin: 13.2 g/dL (ref 12.0–15.0)
MCH: 27.9 pg (ref 26.0–34.0)
MCHC: 32.5 g/dL (ref 30.0–36.0)
MCV: 85.8 fL (ref 78.0–100.0)
Platelets: 388 10*3/uL (ref 150–400)
RBC: 4.73 MIL/uL (ref 3.87–5.11)
RDW: 14.8 % (ref 11.5–15.5)
WBC: 6 10*3/uL (ref 4.0–10.5)

## 2013-11-01 LAB — PROTIME-INR
INR: 1.01 (ref 0.00–1.49)
Prothrombin Time: 13.3 seconds (ref 11.6–15.2)

## 2013-11-01 LAB — I-STAT TROPONIN, ED: TROPONIN I, POC: 0 ng/mL (ref 0.00–0.08)

## 2013-11-01 LAB — APTT: aPTT: 25 seconds (ref 24–37)

## 2013-11-01 NOTE — ED Notes (Signed)
Pt ambulated with assistance to the bathroom. During ambulation Pt c/o dizziness and feeling lightheaded.

## 2013-11-01 NOTE — ED Notes (Signed)
IV in left AC removed

## 2013-11-01 NOTE — ED Notes (Signed)
Bed: WA16 Expected date:  Expected time:  Means of arrival:  Comments: HTN  

## 2013-11-01 NOTE — ED Provider Notes (Signed)
CSN: [id number]     Arrival date & time 11/01/13  1405 History   First MD Initiated Contact with Patient 11/01/13 1457     Chief Complaint  Patient presents with  . Altered Mental Status  . Shortness of Breath     (Consider location/radiation/quality/duration/timing/severity/associated sxs/prior Treatment) HPI Comments: Pt states that her L side feels "foolish". She is unable to choose another word to describe this feeling.   Patient is a 58 y.o. female presenting with altered mental status and shortness of breath. The history is provided by the patient and the spouse. No language interpreter was used.  Altered Mental Status Presenting symptoms: behavior changes, confusion and lethargy   Severity:  Moderate Most recent episode:  Today Episode history:  Single Timing:  Constant Progression:  Unchanged Chronicity:  New Context: not alcohol use, not dementia, not drug use, not head injury, taking medications as prescribed, not a recent change in medication, not a recent illness and not a recent infection   Associated symptoms: difficulty breathing and weakness   Associated symptoms: no abdominal pain, normal movement, no agitation, no bladder incontinence, no decreased appetite, no depression, no fever, no headaches, no light-headedness, no nausea, no palpitations, no slurred speech and no vomiting   Weakness:    Severity:  Moderate   Onset quality:  Unable to specify   Duration:  1 day   Timing:  Constant   Progression:  Unchanged   Chronicity:  New Shortness of Breath Severity: reported earlier to daughter, but does not currently have SOB. Associated symptoms: no abdominal pain, no chest pain, no cough, no diaphoresis, no fever, no headaches, no neck pain, no sore throat and no vomiting   Risk factors: obesity   Risk factors: no recent alcohol use, no hx of PE/DVT, no prolonged immobilization, no recent surgery and no tobacco use     Past Medical History  Diagnosis Date  .  Asthma   . Diabetes mellitus   . CAD (coronary artery disease)     myoview 3/13: mild ant ischemia;  LHC 4/13:  LAD < 10%, pRCA 30%, EF 55-65%  . Hypertension   . GERD (gastroesophageal reflux disease)   . Arthritis   . Chronic headaches   . Depression   . Bradycardia     2/2 beta blockers  . Sleep apnea   . Hyperlipemia   . Chronic back pain   . Chronic left hip pain   . Chronic pain of left elbow    Past Surgical History  Procedure Laterality Date  . Abdominal hysterectomy    . Appendectomy    . Cardiac catheterization  2013    minimal plaque   Family History  Problem Relation Age of Onset  . Colon cancer Brother   . Colon cancer Paternal Aunt   . Stomach cancer Paternal Grandmother   . Heart disease Father   . Heart disease Brother     x3  . Esophageal cancer Neg Hx   . Rectal cancer Neg Hx    History  Substance Use Topics  . Smoking status: Former Games developer  . Smokeless tobacco: Never Used  . Alcohol Use: No   OB History   Grav Para Term Preterm Abortions TAB SAB Ect Mult Living                 Review of Systems  Constitutional: Negative for fever, chills, diaphoresis, activity change, appetite change, fatigue and decreased appetite.  HENT: Negative for congestion, facial swelling, rhinorrhea  and sore throat.   Eyes: Negative for photophobia and discharge.  Respiratory: Positive for shortness of breath. Negative for cough and chest tightness.   Cardiovascular: Negative for chest pain, palpitations and leg swelling.  Gastrointestinal: Negative for nausea, vomiting, abdominal pain and diarrhea.  Endocrine: Negative for polydipsia and polyuria.  Genitourinary: Negative for bladder incontinence, dysuria, frequency, difficulty urinating and pelvic pain.  Musculoskeletal: Negative for arthralgias, back pain, neck pain and neck stiffness.  Skin: Negative for color change and wound.  Allergic/Immunologic: Negative for immunocompromised state.  Neurological: Positive  for weakness. Negative for facial asymmetry, light-headedness, numbness and headaches.  Hematological: Does not bruise/bleed easily.  Psychiatric/Behavioral: Positive for confusion. Negative for agitation.      Allergies  Review of patient's allergies indicates no known allergies.  Home Medications   Prior to Admission medications   Medication Sig Start Date End Date Taking? Authorizing Provider  albuterol (PROVENTIL HFA) 108 (90 BASE) MCG/ACT inhaler Inhale 2 puffs into the lungs every 4 (four) hours as needed for wheezing or shortness of breath.   Yes Historical Provider, MD  aspirin EC 81 MG tablet Take 81 mg by mouth every morning.    Yes Historical Provider, MD  bimatoprost (LUMIGAN) 0.03 % ophthalmic solution Place 1 drop into both eyes at bedtime.    Yes Historical Provider, MD  cloNIDine (CATAPRES) 0.2 MG tablet Take 0.2 mg by mouth 2 (two) times daily.   Yes Historical Provider, MD  dexlansoprazole (DEXILANT) 60 MG capsule Take 60 mg by mouth every morning.    Yes Historical Provider, MD  furosemide (LASIX) 40 MG tablet Take 40 mg by mouth every morning.    Yes Historical Provider, MD  hydrocortisone cream 1 % Apply 1 application topically 2 (two) times daily as needed (For breakouts on face.).   Yes Historical Provider, MD  indomethacin (INDOCIN) 25 MG capsule Take 25 mg by mouth 2 (two) times daily as needed (For joint pain.).   Yes Historical Provider, MD  lisinopril (PRINIVIL,ZESTRIL) 40 MG tablet Take 60 mg by mouth every morning.    Yes Historical Provider, MD  metFORMIN (GLUCOPHAGE) 500 MG tablet Take 500 mg by mouth daily with breakfast.    Yes Historical Provider, MD  nitroGLYCERIN (NITROSTAT) 0.4 MG SL tablet Place 0.4 mg under the tongue every 5 (five) minutes as needed for chest pain. 06/17/11 12/20/13 Yes Scott T Weaver, PA-C  potassium chloride (K-DUR,KLOR-CON) 10 MEQ tablet Take 10 mEq by mouth 2 (two) times daily.   Yes Historical Provider, MD  pravastatin (PRAVACHOL)  20 MG tablet Take 20 mg by mouth every evening. 07/13/11 12/20/13 Yes Scott T Weaver, PA-C   BP 143/63  Pulse 52  Temp(Src) 98.1 F (36.7 C) (Oral)  Resp 18  SpO2 99% Physical Exam  Constitutional: She is oriented to person, place, and time. She appears well-developed and well-nourished. No distress.  HENT:  Head: Normocephalic and atraumatic.  Mouth/Throat: No oropharyngeal exudate.  Eyes: Pupils are equal, round, and reactive to light.  Neck: Normal range of motion. Neck supple.  Cardiovascular: Normal rate, regular rhythm and normal heart sounds.  Exam reveals no gallop and no friction rub.   No murmur heard. Pulmonary/Chest: Effort normal and breath sounds normal. No respiratory distress. She has no wheezes. She has no rales.  Abdominal: Soft. Bowel sounds are normal. She exhibits no distension and no mass. There is no tenderness. There is no rebound and no guarding.  Musculoskeletal: Normal range of motion. She exhibits no edema  and no tenderness.  Neurological: She is alert and oriented to person, place, and time. She has normal strength. No cranial nerve deficit or sensory deficit. She exhibits abnormal muscle tone (generalized weakness throughout). GCS eye subscore is 4. GCS verbal subscore is 5. GCS motor subscore is 6.  Mild past pointing with L hand  Skin: Skin is warm and dry.  Psychiatric: She has a normal mood and affect.    ED Course  Procedures (including critical care time) Labs Review Labs Reviewed  COMPREHENSIVE METABOLIC PANEL - Abnormal; Notable for the following:    Glucose, Bld 102 (*)    Total Bilirubin 0.2 (*)    All other components within normal limits  BLOOD GAS, ARTERIAL - Abnormal; Notable for the following:    pO2, Arterial 71.9 (*)    Bicarbonate 24.5 (*)    All other components within normal limits  CBG MONITORING, ED - Abnormal; Notable for the following:    Glucose-Capillary 121 (*)    All other components within normal limits  URINE CULTURE   PROTIME-INR  APTT  CBC  DIFFERENTIAL  URINALYSIS, ROUTINE W REFLEX MICROSCOPIC  CBG MONITORING, ED  Rosezena Sensor, ED    Imaging Review Dg Chest 2 View  11/01/2013   CLINICAL DATA:  Short of breath.  Chest pain.  EXAM: CHEST  2 VIEW  COMPARISON:  05/09/2011  FINDINGS: Cardiac silhouette is top-normal in size. Normal mediastinal and hilar contours. Lungs are clear allowing for the lordotic positioning, low lung volumes and significant overlying soft tissue. No pleural effusion or pneumothorax.  Bony thorax is grossly intact.  IMPRESSION: No acute cardiopulmonary disease.   Electronically Signed   By: Amie Portland M.D.   On: 11/01/2013 15:29   Ct Head Wo Contrast  11/01/2013   CLINICAL DATA:  Altered mental status.  Left-sided weakness.  EXAM: CT HEAD WITHOUT CONTRAST  TECHNIQUE: Contiguous axial images were obtained from the base of the skull through the vertex without intravenous contrast.  COMPARISON:  04/08/2013  FINDINGS: Sinuses/Soft tissues: Clear paranasal sinuses and mastoid air cells.  Intracranial: No mass lesion, hemorrhage, hydrocephalus, acute infarct, intra-axial, or extra-axial fluid collection.  IMPRESSION: Normal head CT.   Electronically Signed   By: Jeronimo Greaves M.D.   On: 11/01/2013 17:10   Mr Brain Wo Contrast  11/01/2013   CLINICAL DATA:  Altered mental status. The patient states she feels "foolish" although she is alert and oriented.  EXAM: MRI HEAD WITHOUT CONTRAST  TECHNIQUE: Multiplanar, multiecho pulse sequences of the brain and surrounding structures were obtained without intravenous contrast.  COMPARISON:  CT head without contrast 11/01/2013  FINDINGS: The diffusion-weighted images demonstrate no evidence for acute or subacute infarction. No hemorrhage or mass lesion is present. The ventricles are of normal size. No significant extraaxial fluid collection is present. Scattered periventricular and subcortical T2 hyperintensities are slightly greater than expected for  age.  Flow is present in the major intracranial arteries. The globes and orbits are intact. Mild mucosal thickening is present within the ethmoid and maxillary sinuses. The mastoid air cells are clear. Asymmetric pneumatization of the petrous apex is noted.  IMPRESSION: 1. Bilateral subcortical T2 hyperintensities are slightly greater than expected for age. The finding is nonspecific but can be seen in the setting of chronic microvascular ischemia, a demyelinating process such as multiple sclerosis, vasculitis, complicated migraine headaches, or as the sequelae of a prior infectious or inflammatory process. 2. No acute intracranial abnormality.   Electronically Signed   By:  Gennette Pac M.D.   On: 11/01/2013 20:53     EKG Interpretation   Date/Time:  Wednesday November 01 2013 14:10:29 EDT Ventricular Rate:  72 PR Interval:  130 QRS Duration: 87 QT Interval:  380 QTC Calculation: 416 R Axis:   10 Text Interpretation:  Sinus rhythm No significant change was found  Confirmed by DOCHERTY  MD, MEGAN ([id number]) on 11/01/2013 2:59:40 PM      MDM   Final diagnoses:  Altered mental status, unspecified altered mental status type    Pt is a 58 y.o. female with Pmhx as above who presents with AMS beginning this morning. Pt has difficulty describing symptoms except to say that her L side feels "foolish". She cannot find any other word to describe this.  Neuro exam unremarkable except for mild past-pointing with L hand. NO focal sensory deficits.  She speaks slowly, with long pauses before loudly blurting out answers correctly. She is oriented x3.   10:54PM W/u overall unremarkable. Spoke w/ Dr. Roseanne Reno with neurology again, MRI findings are not acute and are non-specific. She has tolerated PO, has ambulated. Given overall nml w/u, I feel she is safe to f/u with PCP for continued symptoms in 1-2 days. Return precautions given for new or worsening symptoms including worsening confusion, focal numbness,  weakness, fevers.        Toy Cookey, MD 11/01/13 [phone]

## 2013-11-01 NOTE — ED Notes (Signed)
Pt observed ambulating to bathroom with assistance of two family members whom refused staff help. Pt seems to have unsteady gait. Wheelchair offered however family refused.

## 2013-11-01 NOTE — ED Notes (Signed)
Pt's daughter states the pt "wasn't acting herself" since 9AM this morning. The pt also complains of SOB since this morning. Pt states she feels "foolish", is A&Ox4.

## 2013-11-01 NOTE — ED Notes (Signed)
Pt was given a sandwich, crackers, and a drink by EDP Micheline Maze

## 2013-11-01 NOTE — ED Notes (Signed)
Pt returned from MRI °

## 2013-11-01 NOTE — Discharge Instructions (Signed)

## 2013-11-01 NOTE — ED Notes (Signed)
In and out done by Arlys John and I

## 2013-11-01 NOTE — Progress Notes (Signed)
  CARE MANAGEMENT ED NOTE 11/01/2013  Patient:  Tammy [name], Tammy [name]   Account Number:  [id number]  Date Initiated:  11/01/2013  Documentation initiated by:  Edd Arbour  Subjective/Objective Assessment:   58 yr old [place], [place] resident Pt's daughter states the pt "wasn't acting herself" since 9AM this morning. The pt also complains of SOB since this morning. Pt states she feels "foolish", is A&Ox4.     Subjective/Objective Assessment Detail:   pcp is michelle edwards NP under Willey Blade at [facility] [place]     Action/Plan:   ED CM Noted not pcp Spoke with pt & Daughter at bedside [facility] [place] to speak with Nelva Bush to confirm pcp EPIC updated Provided daughter with provider name & Family medicine contact/address information   Action/Plan Detail:   Anticipated DC Date:       Status Recommendation to Physician:   Result of Recommendation:    Other ED Services  Consult Working Plan    DC Planning Services  Other  PCP issues  Outpatient Services - Pt will follow up    Choice offered to / List presented to:            Status of service:  Completed, signed off  ED Comments:   ED Comments Detail:

## 2013-11-02 LAB — URINE CULTURE
Colony Count: NO GROWTH
Culture: NO GROWTH
Special Requests: NORMAL

## 2015-11-18 ENCOUNTER — Encounter (HOSPITAL_COMMUNITY): Payer: Self-pay | Admitting: Emergency Medicine

## 2015-11-18 ENCOUNTER — Emergency Department (HOSPITAL_COMMUNITY)
Admission: EM | Admit: 2015-11-18 | Discharge: 2015-11-18 | Disposition: A | Discharge status: Home / Self Care | Payer: No Typology Code available for payment source | Attending: Emergency Medicine | Admitting: Emergency Medicine

## 2015-11-18 DIAGNOSIS — E119 Type 2 diabetes mellitus without complications: Secondary | ICD-10-CM | POA: Insufficient documentation

## 2015-11-18 DIAGNOSIS — Z7982 Long term (current) use of aspirin: Secondary | ICD-10-CM | POA: Insufficient documentation

## 2015-11-18 DIAGNOSIS — A599 Trichomoniasis, unspecified: Secondary | ICD-10-CM | POA: Insufficient documentation

## 2015-11-18 DIAGNOSIS — Z7984 Long term (current) use of oral hypoglycemic drugs: Secondary | ICD-10-CM | POA: Insufficient documentation

## 2015-11-18 DIAGNOSIS — J45909 Unspecified asthma, uncomplicated: Secondary | ICD-10-CM | POA: Insufficient documentation

## 2015-11-18 DIAGNOSIS — I10 Essential (primary) hypertension: Secondary | ICD-10-CM | POA: Insufficient documentation

## 2015-11-18 DIAGNOSIS — Z87891 Personal history of nicotine dependence: Secondary | ICD-10-CM | POA: Insufficient documentation

## 2015-11-18 DIAGNOSIS — I2510 Atherosclerotic heart disease of native coronary artery without angina pectoris: Secondary | ICD-10-CM | POA: Insufficient documentation

## 2015-11-18 DIAGNOSIS — Z79899 Other long term (current) drug therapy: Secondary | ICD-10-CM | POA: Insufficient documentation

## 2015-11-18 DIAGNOSIS — N390 Urinary tract infection, site not specified: Secondary | ICD-10-CM | POA: Insufficient documentation

## 2015-11-18 HISTORY — DX: Unspecified glaucoma: H40.9

## 2015-11-18 LAB — URINALYSIS, ROUTINE W REFLEX MICROSCOPIC
BILIRUBIN URINE: NEGATIVE
GLUCOSE, UA: NEGATIVE mg/dL
KETONES UR: NEGATIVE mg/dL
Nitrite: NEGATIVE
Protein, ur: NEGATIVE mg/dL
Specific Gravity, Urine: 1.026 (ref 1.005–1.030)
pH: 5 (ref 5.0–8.0)

## 2015-11-18 LAB — URINE MICROSCOPIC-ADD ON

## 2015-11-18 LAB — WET PREP, GENITAL
Clue Cells Wet Prep HPF POC: NONE SEEN
Sperm: NONE SEEN
YEAST WET PREP: NONE SEEN

## 2015-11-18 MED ORDER — METRONIDAZOLE 500 MG PO TABS: 500.0000 mg | ORAL_TABLET | Freq: Two times a day (BID) | ORAL | 0 refills | Status: DC

## 2015-11-18 MED ORDER — AZITHROMYCIN 250 MG PO TABS
1000.0000 mg | ORAL_TABLET | Freq: Every day | ORAL | Status: DC
  Administered 2015-11-18: 1000 mg via ORAL
  Filled 2015-11-18: qty 4

## 2015-11-18 MED ORDER — METRONIDAZOLE 500 MG PO TABS
500.0000 mg | ORAL_TABLET | Freq: Once | ORAL | Status: AC
  Administered 2015-11-18: 500 mg via ORAL
  Filled 2015-11-18: qty 1

## 2015-11-18 MED ORDER — CEFTRIAXONE SODIUM 250 MG IJ SOLR
250.0000 mg | Freq: Once | INTRAMUSCULAR | Status: AC
  Administered 2015-11-18: 250 mg via INTRAMUSCULAR
  Filled 2015-11-18: qty 250

## 2015-11-18 MED ORDER — CEPHALEXIN 500 MG PO CAPS: 500.0000 mg | ORAL_CAPSULE | Freq: Two times a day (BID) | ORAL | 0 refills | Status: DC

## 2015-11-18 MED ORDER — STERILE WATER FOR INJECTION IJ SOLN
INTRAMUSCULAR | Status: AC
  Filled 2015-11-18: qty 10

## 2015-11-18 NOTE — ED Provider Notes (Signed)
[facility]-EMERGENCY DEPT Provider Note   CSN: [id number] Arrival date & time: 11/18/15  1421     History   Chief Complaint Chief Complaint  Patient presents with  . Vaginal Discharge    unusual discharge    HPI Tammy [name] is a 60 y.o. female.  HPI 60 year old female who presents with abnormal vaginal discharge starting last week. Tried to use OTC treatment for yeast, but has had increased itching. Yesterday, went to [facility] pharmacy, and was recommended treatment for yeast. Used the intravaginal treatment last night, and noticed some blood on the applicator. Some mild left pelvic pain. No dysuria, urinary frequency, fever, or vomiting. Post menopausal, she reports. She is sexually active. No concerns for STD exposures.  Past Medical History:  Diagnosis Date  . Arthritis   . Asthma   . Bradycardia    2/2 beta blockers  . CAD (coronary artery disease)    myoview 3/13: mild ant ischemia;  LHC 4/13:  LAD < 10%, pRCA 30%, EF 55-65%  . Chronic back pain   . Chronic headaches   . Chronic left hip pain   . Chronic pain of left elbow   . Depression   . Diabetes mellitus   . GERD (gastroesophageal reflux disease)   . Glaucoma   . Hyperlipemia   . Hypertension   . Sleep apnea     Patient Active Problem List   Diagnosis Date Noted  . Nausea alone 09/28/2011  . Dysphagia, unspecified(787.20) 06/24/2011  . Family history of malignant neoplasm of gastrointestinal tract 06/24/2011  . Left hip pain 05/09/2011  . Left elbow pain 05/09/2011  . Altered mental state 05/09/2011  . SHOULDER PAIN, RIGHT 06/05/2010  . LOCALIZED SUPERFICIAL SWELLING MASS OR LUMP 05/14/2010  . HEADACHE 04/24/2010  . CERVICAL STRAIN 04/24/2010  . OBESITY 10/16/2009  . SCIATICA, RIGHT 09/25/2009  . GERD 07/22/2009  . VARICOSE VEINS, LOWER EXTREMITIES 05/07/2009  . SYNCOPE 12/20/2008  . VAGINITIS 07/25/2008  . FATIGUE 05/23/2008  . PALPITATIONS 05/23/2008  . BRADYCARDIA 05/22/2008  . GANGLION CYST,  WRIST, RIGHT 12/13/2007  . ABDOMINAL PAIN, GENERALIZED 12/13/2007  . ARTHRALGIA 07/25/2007  . OTITIS MEDIA, ACUTE, RIGHT 05/18/2007  . DIABETES MELLITUS, TYPE II 01/18/2007  . DEPRESSION 01/18/2007  . OBSTRUCTIVE SLEEP APNEA 01/18/2007  . ASTHMA NOS W/O STATUS ASTHMATICUS 01/18/2007  . HYPERTENSION 12/03/2006  . LICHEN PLANUS 06/02/2004    Past Surgical History:  Procedure Laterality Date  . ABDOMINAL HYSTERECTOMY    . APPENDECTOMY    . CARDIAC CATHETERIZATION  2013   minimal plaque    OB History    No data available       Home Medications    Prior to Admission medications   Medication Sig Start Date End Date Taking? Authorizing Provider  albuterol (PROVENTIL HFA) 108 (90 BASE) MCG/ACT inhaler Inhale 2 puffs into the lungs every 4 (four) hours as needed for wheezing or shortness of breath.    Historical Provider, MD  aspirin EC 81 MG tablet Take 81 mg by mouth every morning.     Historical Provider, MD  bimatoprost (LUMIGAN) 0.03 % ophthalmic solution Place 1 drop into both eyes at bedtime.     Historical Provider, MD  cephALEXin (KEFLEX) 500 MG capsule Take 1 capsule (500 mg total) by mouth 2 (two) times daily. 11/18/15   Lavera Guise, MD  cloNIDine (CATAPRES) 0.2 MG tablet Take 0.2 mg by mouth 2 (two) times daily.    Historical Provider, MD  dexlansoprazole (  DEXILANT) 60 MG capsule Take 60 mg by mouth every morning.     Historical Provider, MD  furosemide (LASIX) 40 MG tablet Take 40 mg by mouth every morning.     Historical Provider, MD  hydrocortisone cream 1 % Apply 1 application topically 2 (two) times daily as needed (For breakouts on face.).    Historical Provider, MD  indomethacin (INDOCIN) 25 MG capsule Take 25 mg by mouth 2 (two) times daily as needed (For joint pain.).    Historical Provider, MD  lisinopril (PRINIVIL,ZESTRIL) 40 MG tablet Take 60 mg by mouth every morning.     Historical Provider, MD  metFORMIN (GLUCOPHAGE) 500 MG tablet Take 500 mg by mouth daily  with breakfast.     Historical Provider, MD  metroNIDAZOLE (FLAGYL) 500 MG tablet Take 1 tablet (500 mg total) by mouth 2 (two) times daily. 11/18/15   Lavera Guise, MD  nitroGLYCERIN (NITROSTAT) 0.4 MG SL tablet Place 0.4 mg under the tongue every 5 (five) minutes as needed for chest pain. 06/17/11 12/20/13  Beatrice Lecher, PA-C  potassium chloride (K-DUR,KLOR-CON) 10 MEQ tablet Take 10 mEq by mouth 2 (two) times daily.    Historical Provider, MD  pravastatin (PRAVACHOL) 20 MG tablet Take 20 mg by mouth every evening. 07/13/11 12/20/13  Beatrice Lecher, PA-C    Family History Family History  Problem Relation Age of Onset  . Colon cancer Brother   . Colon cancer Paternal Aunt   . Stomach cancer Paternal Grandmother   . Heart disease Father   . Heart disease Brother     x3  . Esophageal cancer Neg Hx   . Rectal cancer Neg Hx     Social History Social History  Substance Use Topics  . Smoking status: Former Games developer  . Smokeless tobacco: Never Used  . Alcohol use No     Allergies   Review of patient's allergies indicates no known allergies.   Review of Systems Review of Systems 10/14 systems reviewed and are negative other than those stated in the HPI   Physical Exam Updated Vital Signs BP 154/79   Pulse 72   Temp 98.1 F (36.7 C) (Oral)   Resp 18   Wt 240 lb (108.9 kg)   SpO2 99%   BMI 41.52 kg/m   Physical Exam Physical Exam  Nursing note and vitals reviewed. Constitutional: Well developed, well nourished, non-toxic, and in no acute distress Head: Normocephalic and atraumatic.  Mouth/Throat: Oropharynx is clear and moist.  Neck: Normal range of motion. Neck supple.  Cardiovascular: Normal rate and regular rhythm.   Pulmonary/Chest: Effort normal and breath sounds normal.  Abdominal: Soft. There is no tenderness. There is no rebound and no guarding.  Musculoskeletal: Normal range of motion.  Neurological: Alert, no facial droop, fluent speech, moves all extremities  symmetrically Skin: Skin is warm and dry.  Psychiatric: Cooperative  Pelvic: Normal external genitalia. Cervix appears a little friable with mild bleeding.  Copious greenish yellow appearing discharge. No blood within the vagina. No cervical motion tenderness. No adnexal masses or tenderness.   ED Treatments / Results  Labs (all labs ordered are listed, but only abnormal results are displayed) Labs Reviewed  WET PREP, GENITAL - Abnormal; Notable for the following:       Result Value   Trich, Wet Prep PRESENT (*)    WBC, Wet Prep HPF POC MANY (*)    All other components within normal limits  URINALYSIS, ROUTINE W REFLEX MICROSCOPIC (NOT  AT [facility]) - Abnormal; Notable for the following:    APPearance CLOUDY (*)    Hgb urine dipstick SMALL (*)    Leukocytes, UA LARGE (*)    All other components within normal limits  URINE MICROSCOPIC-ADD ON - Abnormal; Notable for the following:    Squamous Epithelial / LPF 6-30 (*)    Bacteria, UA MANY (*)    All other components within normal limits  URINE CULTURE  GC/CHLAMYDIA PROBE AMP ([place]) NOT AT [facility]    EKG  EKG Interpretation None       Radiology No results found.  Procedures Procedures (including critical care time)  Medications Ordered in ED Medications  cefTRIAXone (ROCEPHIN) injection 250 mg (not administered)  azithromycin (ZITHROMAX) tablet 1,000 mg (not administered)  metroNIDAZOLE (FLAGYL) tablet 500 mg (not administered)     Initial Impression / Assessment and Plan / ED Course  I have reviewed the triage vital signs and the nursing notes.  Pertinent labs & imaging results that were available during my care of the patient were reviewed by me and considered in my medical decision making (see chart for details).  Clinical Course    Presenting with 1 week of vaginal discharge. Vital signs stable. She is well-appearing and in no acute distress. Abdomen soft and benign and without CVA tenderness. Pelvic exam  with friable appearing cervix with some superficial bleeding which is likely blood seen on her intravaginal yeast treatment at home. Her wet prep is positive for Trichomonas, and she at this time wants to be empirically treated for gonorrhea and chlamydia. UA is also positive for likely UTI. It is sent for culture. She is given ceftriaxone and azithromycin in the emergency department. We'll send home with a course of Flagyl and Keflex for Trichomonas and UTI, respectively. Strict return and follow-up instructions reviewed. She expressed understanding of all discharge instructions and felt comfortable with the plan of care.  Final Clinical Impressions(s) / ED Diagnoses   Final diagnoses:  UTI (lower urinary tract infection)  Trichomonas infection    New Prescriptions New Prescriptions   CEPHALEXIN (KEFLEX) 500 MG CAPSULE    Take 1 capsule (500 mg total) by mouth 2 (two) times daily.   METRONIDAZOLE (FLAGYL) 500 MG TABLET    Take 1 tablet (500 mg total) by mouth 2 (two) times daily.     Lavera Guise, MD 11/18/15 2009

## 2015-11-18 NOTE — Discharge Instructions (Signed)
Take antibiotics as needed. Return without fail for worsening symptoms including fever, severe abdominal pain or flank pain, intractable vomiting, or any other symptoms concerning to you.  Please avoid sexual intercourse or use barrier protection for sexual intercourse during treatment with antibiotics. Please let you sexual partner know to get treated as well.

## 2015-11-18 NOTE — ED Triage Notes (Addendum)
Pt presents to the ED with yellowish, thick vaginal discharge. Pt reports no odor and that she has never had discharge before. Pt reports no pain. Pt reports trying a vaginal cream from [employer]. Pt reports after using the vaginal cream, she saw blood on the stick she used to place the cream. Pt is postmenopausal.

## 2015-11-19 LAB — GC/CHLAMYDIA PROBE AMP (~~LOC~~) NOT AT ARMC
Chlamydia: NEGATIVE
Neisseria Gonorrhea: NEGATIVE

## 2015-11-20 LAB — URINE CULTURE

## 2016-01-06 ENCOUNTER — Emergency Department (HOSPITAL_COMMUNITY)
Admission: EM | Admit: 2016-01-06 | Discharge: 2016-01-07 | Disposition: A | Discharge status: Home / Self Care | Payer: Self-pay | Attending: Emergency Medicine | Admitting: Emergency Medicine

## 2016-01-06 ENCOUNTER — Encounter (HOSPITAL_COMMUNITY): Payer: Self-pay | Admitting: Emergency Medicine

## 2016-01-06 ENCOUNTER — Emergency Department (HOSPITAL_COMMUNITY): Payer: Self-pay

## 2016-01-06 DIAGNOSIS — R519 Headache, unspecified: Secondary | ICD-10-CM

## 2016-01-06 DIAGNOSIS — E119 Type 2 diabetes mellitus without complications: Secondary | ICD-10-CM | POA: Insufficient documentation

## 2016-01-06 DIAGNOSIS — R51 Headache: Secondary | ICD-10-CM | POA: Insufficient documentation

## 2016-01-06 DIAGNOSIS — Z7984 Long term (current) use of oral hypoglycemic drugs: Secondary | ICD-10-CM | POA: Insufficient documentation

## 2016-01-06 DIAGNOSIS — I2510 Atherosclerotic heart disease of native coronary artery without angina pectoris: Secondary | ICD-10-CM | POA: Insufficient documentation

## 2016-01-06 DIAGNOSIS — Z7982 Long term (current) use of aspirin: Secondary | ICD-10-CM | POA: Insufficient documentation

## 2016-01-06 DIAGNOSIS — Z7951 Long term (current) use of inhaled steroids: Secondary | ICD-10-CM | POA: Insufficient documentation

## 2016-01-06 DIAGNOSIS — I10 Essential (primary) hypertension: Secondary | ICD-10-CM | POA: Insufficient documentation

## 2016-01-06 DIAGNOSIS — J45909 Unspecified asthma, uncomplicated: Secondary | ICD-10-CM | POA: Insufficient documentation

## 2016-01-06 DIAGNOSIS — Z87891 Personal history of nicotine dependence: Secondary | ICD-10-CM | POA: Insufficient documentation

## 2016-01-06 DIAGNOSIS — M069 Rheumatoid arthritis, unspecified: Secondary | ICD-10-CM | POA: Insufficient documentation

## 2016-01-06 MED ORDER — HYDROCODONE-ACETAMINOPHEN 5-325 MG PO TABS
2.0000 | ORAL_TABLET | Freq: Once | ORAL | Status: AC
  Administered 2016-01-07: 2 via ORAL
  Filled 2016-01-06: qty 2
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
## 2016-01-07 MED ORDER — NAPROXEN 500 MG PO TABS: 500.0000 mg | ORAL_TABLET | Freq: Two times a day (BID) | ORAL | 0 refills | Status: DC

## 2016-01-07 MED ORDER — HYDROCODONE-ACETAMINOPHEN 5-325 MG PO TABS: 1.0000 | ORAL_TABLET | Freq: Four times a day (QID) | ORAL | 0 refills | Status: DC | PRN

## 2016-01-07 NOTE — Discharge Instructions (Signed)
Naproxen as prescribed.  Hydrocodone as prescribed as needed for pain not relieved with naproxen.  Follow-up with your primary Dr. if not improving in the next week.

## 2016-03-07 ENCOUNTER — Emergency Department (HOSPITAL_COMMUNITY): Payer: Self-pay

## 2016-03-07 ENCOUNTER — Emergency Department (HOSPITAL_COMMUNITY)
Admission: EM | Admit: 2016-03-07 | Discharge: 2016-03-07 | Disposition: A | Discharge status: Home / Self Care | Payer: Self-pay | Attending: Emergency Medicine | Admitting: Emergency Medicine

## 2016-03-07 ENCOUNTER — Encounter (HOSPITAL_COMMUNITY): Payer: Self-pay

## 2016-03-07 DIAGNOSIS — I2510 Atherosclerotic heart disease of native coronary artery without angina pectoris: Secondary | ICD-10-CM | POA: Insufficient documentation

## 2016-03-07 DIAGNOSIS — E119 Type 2 diabetes mellitus without complications: Secondary | ICD-10-CM | POA: Insufficient documentation

## 2016-03-07 DIAGNOSIS — M1990 Unspecified osteoarthritis, unspecified site: Secondary | ICD-10-CM

## 2016-03-07 DIAGNOSIS — M069 Rheumatoid arthritis, unspecified: Secondary | ICD-10-CM | POA: Insufficient documentation

## 2016-03-07 DIAGNOSIS — Z7984 Long term (current) use of oral hypoglycemic drugs: Secondary | ICD-10-CM | POA: Insufficient documentation

## 2016-03-07 DIAGNOSIS — I10 Essential (primary) hypertension: Secondary | ICD-10-CM | POA: Insufficient documentation

## 2016-03-07 DIAGNOSIS — Z7982 Long term (current) use of aspirin: Secondary | ICD-10-CM | POA: Insufficient documentation

## 2016-03-07 DIAGNOSIS — M25562 Pain in left knee: Secondary | ICD-10-CM

## 2016-03-07 DIAGNOSIS — J45909 Unspecified asthma, uncomplicated: Secondary | ICD-10-CM | POA: Insufficient documentation

## 2016-03-07 DIAGNOSIS — Z87891 Personal history of nicotine dependence: Secondary | ICD-10-CM | POA: Insufficient documentation

## 2016-03-07 MED ORDER — MELOXICAM 7.5 MG PO TABS: 7.5000 mg | ORAL_TABLET | Freq: Every day | ORAL | 0 refills | Status: DC

## 2016-03-07 MED ORDER — TRAMADOL HCL 50 MG PO TABS: 50.0000 mg | ORAL_TABLET | Freq: Four times a day (QID) | ORAL | 0 refills | Status: DC | PRN

## 2016-03-07 NOTE — ED Provider Notes (Signed)
[facility]-EMERGENCY DEPT Provider Note   CSN: [id number] Arrival date & time: 03/07/16  3267     History   Chief Complaint Chief Complaint  Patient presents with  . Leg Swelling    HPI Tammy [name] is a 60 y.o. female with pmh as noted presents to ED with left knee swelling, tightness and stiffness x 4 days.  Pt reports difficulty lifting leg up and bending at both the L hip and L knee, states she has to use both hands to lift leg up off the bed.  Pt having difficulty putting socks and pants on this morning.  Pt denies falls or previous trauma to L knee and L hip.  Pt denies redness over L knee.  Pt denies fevers.  No h/o IVDU.  Pt reports she has a h/o "bad" rheumatoid arthritis, currently not being treated because medications did not help.  Pt has not tried anything for symptoms.  Pt states swelling, tightness and stiffness of L knee is constant and does not improve throughout the day.  Tightness and stiffness is not worse in the morning and does not decrease throughout the day.    HPI  Past Medical History:  Diagnosis Date  . Arthritis   . Asthma   . Bradycardia    2/2 beta blockers  . CAD (coronary artery disease)    myoview 3/13: mild ant ischemia;  LHC 4/13:  LAD < 10%, pRCA 30%, EF 55-65%  . Chronic back pain   . Chronic headaches   . Chronic left hip pain   . Chronic pain of left elbow   . Depression   . Diabetes mellitus   . GERD (gastroesophageal reflux disease)   . Glaucoma   . Hyperlipemia   . Hypertension   . Sleep apnea     Patient Active Problem List   Diagnosis Date Noted  . Nausea alone 09/28/2011  . Dysphagia, unspecified(787.20) 06/24/2011  . Family history of malignant neoplasm of gastrointestinal tract 06/24/2011  . Left hip pain 05/09/2011  . Left elbow pain 05/09/2011  . Altered mental state 05/09/2011  . SHOULDER PAIN, RIGHT 06/05/2010  . LOCALIZED SUPERFICIAL SWELLING MASS OR LUMP 05/14/2010  . HEADACHE 04/24/2010  . CERVICAL STRAIN  04/24/2010  . OBESITY 10/16/2009  . SCIATICA, RIGHT 09/25/2009  . GERD 07/22/2009  . VARICOSE VEINS, LOWER EXTREMITIES 05/07/2009  . SYNCOPE 12/20/2008  . VAGINITIS 07/25/2008  . FATIGUE 05/23/2008  . PALPITATIONS 05/23/2008  . BRADYCARDIA 05/22/2008  . GANGLION CYST, WRIST, RIGHT 12/13/2007  . ABDOMINAL PAIN, GENERALIZED 12/13/2007  . ARTHRALGIA 07/25/2007  . OTITIS MEDIA, ACUTE, RIGHT 05/18/2007  . DIABETES MELLITUS, TYPE II 01/18/2007  . DEPRESSION 01/18/2007  . OBSTRUCTIVE SLEEP APNEA 01/18/2007  . ASTHMA NOS W/O STATUS ASTHMATICUS 01/18/2007  . HYPERTENSION 12/03/2006  . LICHEN PLANUS 06/02/2004    Past Surgical History:  Procedure Laterality Date  . ABDOMINAL HYSTERECTOMY    . APPENDECTOMY    . CARDIAC CATHETERIZATION  2013   minimal plaque    OB History    No data available       Home Medications    Prior to Admission medications   Medication Sig Start Date End Date Taking? Authorizing Provider  albuterol (PROVENTIL HFA) 108 (90 BASE) MCG/ACT inhaler Inhale 2 puffs into the lungs every 4 (four) hours as needed for wheezing or shortness of breath.   Yes Historical Provider, MD  amLODipine (NORVASC) 10 MG tablet Take 10 mg by mouth daily.   Yes Historical Provider,  MD  aspirin EC 81 MG tablet Take 81 mg by mouth every morning.    Yes Historical Provider, MD  cloNIDine (CATAPRES) 0.2 MG tablet Take 0.2 mg by mouth 3 (three) times daily.    Yes Historical Provider, MD  dexlansoprazole (DEXILANT) 60 MG capsule Take 60 mg by mouth every morning.    Yes Historical Provider, MD  folic acid (FOLVITE) 1 MG tablet Take 2 mg by mouth daily.    Yes Historical Provider, MD  hydrochlorothiazide (HYDRODIURIL) 25 MG tablet Take 25 mg by mouth daily.   Yes Historical Provider, MD  indomethacin (INDOCIN) 25 MG capsule Take 25 mg by mouth 2 (two) times daily with a meal.    Yes Historical Provider, MD  metFORMIN (GLUCOPHAGE) 500 MG tablet Take 500 mg by mouth daily. Take 1 tablet  daily with food   Yes Historical Provider, MD  nitroGLYCERIN (NITROSTAT) 0.4 MG SL tablet Place 0.4 mg under the tongue every 5 (five) minutes as needed for chest pain. 06/17/11 03/07/17 Yes Scott T Weaver, PA-C  ondansetron (ZOFRAN) 4 MG tablet Take 4 mg by mouth every 8 (eight) hours as needed for nausea or vomiting.   Yes Historical Provider, MD  potassium chloride (K-DUR,KLOR-CON) 10 MEQ tablet Take 10 mEq by mouth 2 (two) times daily.   Yes Historical Provider, MD  pravastatin (PRAVACHOL) 20 MG tablet Take 20 mg by mouth every evening. 07/13/11 03/07/17 Yes Scott T Weaver, PA-C  bimatoprost (LUMIGAN) 0.03 % ophthalmic solution Place 1 drop into both eyes at bedtime.     Historical Provider, MD  cephALEXin (KEFLEX) 500 MG capsule Take 1 capsule (500 mg total) by mouth 2 (two) times daily. Patient not taking: Reported on 03/07/2016 11/18/15   Lavera Guise, MD  furosemide (LASIX) 40 MG tablet Take 40 mg by mouth every morning.     Historical Provider, MD  glimepiride (AMARYL) 4 MG tablet Take 4 mg by mouth daily with breakfast.    Historical Provider, MD  HYDROcodone-acetaminophen (NORCO) 5-325 MG tablet Take 1-2 tablets by mouth every 6 (six) hours as needed. Patient not taking: Reported on 03/07/2016 01/07/16   Geoffery Lyons, MD  lisinopril (PRINIVIL,ZESTRIL) 40 MG tablet Take 60 mg by mouth every morning.     Historical Provider, MD  meloxicam (MOBIC) 7.5 MG tablet Take 1 tablet (7.5 mg total) by mouth daily. 03/07/16   Rolland Porter, MD  metroNIDAZOLE (FLAGYL) 500 MG tablet Take 1 tablet (500 mg total) by mouth 2 (two) times daily. Patient not taking: Reported on 03/07/2016 11/18/15   Lavera Guise, MD  naproxen (NAPROSYN) 500 MG tablet Take 1 tablet (500 mg total) by mouth 2 (two) times daily. Patient not taking: Reported on 03/07/2016 01/07/16   Geoffery Lyons, MD  Polyethyl Glycol-Propyl Glycol (SYSTANE ULTRA) 0.4-0.3 % SOLN Place 1 drop into both eyes daily.    Historical Provider, MD  Timolol  Maleate (TIMOPTIC OP) Place 1 drop into both eyes daily.    Historical Provider, MD  traMADol (ULTRAM) 50 MG tablet Take 1 tablet (50 mg total) by mouth every 6 (six) hours as needed. 03/07/16   Rolland Porter, MD    Family History Family History  Problem Relation Age of Onset  . Colon cancer Brother   . Colon cancer Paternal Aunt   . Stomach cancer Paternal Grandmother   . Heart disease Father   . Heart disease Brother     x3  . Esophageal cancer Neg Hx   . Rectal cancer  Neg Hx     Social History Social History  Substance Use Topics  . Smoking status: Former Games developer  . Smokeless tobacco: Never Used  . Alcohol use No     Allergies   Patient has no known allergies.   Review of Systems Review of Systems  Constitutional: Negative for chills and fever.  HENT: Negative for sore throat.   Eyes: Negative for visual disturbance.  Respiratory: Negative for cough, chest tightness and shortness of breath.   Cardiovascular: Negative for chest pain.  Gastrointestinal: Negative for abdominal pain, constipation, diarrhea, nausea and vomiting.  Genitourinary: Negative for dysuria.  Musculoskeletal: Negative for back pain.  Skin: Negative for color change and rash.  Neurological: Negative for syncope and headaches.  All other systems reviewed and are negative.    Physical Exam Updated Vital Signs BP 145/68   Pulse 75   Temp 98.3 F (36.8 C)   Resp 17   Ht 5\' 2"  (1.575 m)   Wt 108.9 kg   SpO2 100%   BMI 43.90 kg/m   Physical Exam  Constitutional: She appears well-developed and well-nourished. No distress.  Found patient with nurse trying to get onto bed, pt had to use both hands to lift L leg onto the bed.   HENT:  Head: Normocephalic and atraumatic.  Mouth/Throat: Oropharynx is clear and moist. No oropharyngeal exudate.  Eyes: Conjunctivae are normal. Pupils are equal, round, and reactive to light.  Neck: Neck supple.  Cardiovascular: Normal rate, regular rhythm and  normal heart sounds.   No murmur heard. Strong popliteal and DP pulses bilaterally.  Pulmonary/Chest: Effort normal and breath sounds normal. No respiratory distress. She has no wheezes.  Abdominal: Soft. There is no tenderness.  Musculoskeletal:  Positive McMurray test for medial meniscus with audible crack.  Positive valgus stress test. Mild edema over medial aspect of knee.  Tenderness over medial joint line.  No erythema or skin changes, no fluctuance noted over L knee.   Lymphadenopathy:    She has no cervical adenopathy.  Neurological: She is alert.  Skin: Skin is warm and dry. Capillary refill takes less than 2 seconds.  Psychiatric: She has a normal mood and affect.  Nursing note and vitals reviewed.    ED Treatments / Results  Labs (all labs ordered are listed, but only abnormal results are displayed) Labs Reviewed - No data to display  EKG  EKG Interpretation None       Radiology Dg Knee Complete 4 Views Left  Result Date: 03/07/2016 CLINICAL DATA:  Left knee pain for 5 days.  No known injury. EXAM: LEFT KNEE - COMPLETE 4+ VIEW COMPARISON:  06/11/2013 FINDINGS: Mild degenerative changes in the medial and patellofemoral compartments. Small joint effusion. No acute bony abnormality. Specifically, no fracture, subluxation, or dislocation. Soft tissues are intact. IMPRESSION: Mild degenerative changes and small joint effusion. No acute bony abnormality. Electronically Signed   By: Charlett Nose M.D.   On: 03/07/2016 10:52   Dg Hip Unilat With Pelvis 2-3 Views Left  Result Date: 03/07/2016 CLINICAL DATA:  Left knee, thigh and hip pain for 5 days. EXAM: DG HIP (WITH OR WITHOUT PELVIS) 2-3V LEFT COMPARISON:  None. FINDINGS: Mild joint space narrowing in the hip joints bilaterally. SI joints are symmetric and unremarkable. No acute bony abnormality. Specifically, no fracture, subluxation, or dislocation. Soft tissues are intact. IMPRESSION: No acute bony abnormality.  Electronically Signed   By: Charlett Nose M.D.   On: 03/07/2016 10:51    Procedures  Procedures (including critical care time)  Medications Ordered in ED Medications - No data to display   Initial Impression / Assessment and Plan / ED Course  I have reviewed the triage vital signs and the nursing notes.  Pertinent labs & imaging results that were available during my care of the patient were reviewed by me and considered in my medical decision making (see chart for details).  Clinical Course    60 yo female with pertinent pmh of rheumatoid arthritis presents with L knee stiffness and tightness x 4 days.  Knee exam finding most c/w with musculoskeletal injury.  Likely meniscus injury or OA. Could be RA joint pain as well.  No signs of septic joint or gout. Knee x-ray remarkable for degenerative changes in medial and patellofemoral compartments with small effusion.  Pt was followed by Dr. Corliss Skains for rheumatoid arthritis.  Pt has PCP appointment 12/29.  Pt will be discharged with meloxicam and tramadol for symptomatic treatment. Encouraged pt to follow up with PCP and possibly rheumatologist for symptoms.  ED return instructions give, pt verbalized understanding.  Pt agreeable to dispo plan.    Pt discussed with and evaluated by Dr. Fayrene Fearing who agreed with treatment approach and dispo plan.   Final Clinical Impressions(s) / ED Diagnoses   Final diagnoses:  Arthritis  Acute pain of left knee    New Prescriptions Discharge Medication List as of 03/07/2016 11:07 AM    START taking these medications   Details  meloxicam (MOBIC) 7.5 MG tablet Take 1 tablet (7.5 mg total) by mouth daily., Starting Sat 03/07/2016, Print    traMADol (ULTRAM) 50 MG tablet Take 1 tablet (50 mg total) by mouth every 6 (six) hours as needed., Starting Sat 03/07/2016, Print         Liberty Handy, PA-C 03/07/16 1204    Rolland Porter, MD 03/12/16 1726

## 2016-03-07 NOTE — ED Notes (Signed)
Warm compresses placed to LT knee, top and bottom, as requested by ED PA.

## 2016-03-07 NOTE — Discharge Instructions (Signed)
Rx as directed.  Keep appointment with PCP on 12/29.  See Dr. Corliss Skains when able.

## 2016-03-07 NOTE — ED Provider Notes (Signed)
Patient seen and evaluated. Discussed with Francee Nodal PA-C. Patient seen independently.  Patient complains of feeling the pain for the last several days. No trauma. Is limping. No warmth. 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 History of rheumatoid. Previously followed with Dr. Titus Dubin, but "hasn't paid the bill off yet".  Has pain and tenderness on the medial aspect of the knee along the joint line. No clinically palpable effusion. No warmth or erythema. Full range of motion of the hip and ankle. Painful range the knee. Painful with stress of the medial compartment.  Tray showed degenerative joint disease. More progressive medially in the knee with narrowing of the joint space.  I discussed using anti-inflammatory limited pain medication, primary care, and rheumatological follow-up with patient.   Rolland Porter, MD 03/07/16 [phone]

## 2016-03-07 NOTE — ED Triage Notes (Signed)
Pt states she awoke on Tues (4 days ago) w/ LT leg swelling.  Denies injury.  States her LT arm is also a bit swollen.  States she called [facility] for an appt but it isn't until Dec. 29th.  States she is having trouble walking with the swelling and the leg is heavy. She had to pick it up with her hands to get it off the stretcher.

## 2016-04-11 ENCOUNTER — Other Ambulatory Visit: Payer: Self-pay | Admitting: Rheumatology

## 2016-04-30 ENCOUNTER — Encounter: Payer: Self-pay | Admitting: Gastroenterology

## 2016-11-23 ENCOUNTER — Emergency Department (HOSPITAL_COMMUNITY)
Admission: EM | Admit: 2016-11-23 | Discharge: 2016-11-23 | Disposition: A | Discharge status: Home / Self Care | Payer: No Typology Code available for payment source | Attending: Emergency Medicine | Admitting: Emergency Medicine

## 2016-11-23 ENCOUNTER — Encounter (HOSPITAL_COMMUNITY): Payer: Self-pay | Admitting: Emergency Medicine

## 2016-11-23 DIAGNOSIS — I2510 Atherosclerotic heart disease of native coronary artery without angina pectoris: Secondary | ICD-10-CM | POA: Insufficient documentation

## 2016-11-23 DIAGNOSIS — M109 Gout, unspecified: Secondary | ICD-10-CM | POA: Insufficient documentation

## 2016-11-23 DIAGNOSIS — Z7982 Long term (current) use of aspirin: Secondary | ICD-10-CM | POA: Insufficient documentation

## 2016-11-23 DIAGNOSIS — J45909 Unspecified asthma, uncomplicated: Secondary | ICD-10-CM | POA: Insufficient documentation

## 2016-11-23 DIAGNOSIS — I10 Essential (primary) hypertension: Secondary | ICD-10-CM | POA: Insufficient documentation

## 2016-11-23 DIAGNOSIS — Z7984 Long term (current) use of oral hypoglycemic drugs: Secondary | ICD-10-CM | POA: Insufficient documentation

## 2016-11-23 DIAGNOSIS — Z87891 Personal history of nicotine dependence: Secondary | ICD-10-CM | POA: Insufficient documentation

## 2016-11-23 DIAGNOSIS — E119 Type 2 diabetes mellitus without complications: Secondary | ICD-10-CM | POA: Insufficient documentation

## 2016-11-23 DIAGNOSIS — Z79899 Other long term (current) drug therapy: Secondary | ICD-10-CM | POA: Insufficient documentation

## 2016-11-23 MED ORDER — PREDNISONE 20 MG PO TABS
60.0000 mg | ORAL_TABLET | Freq: Once | ORAL | Status: AC
  Administered 2016-11-23: 60 mg via ORAL
  Filled 2016-11-23: qty 3

## 2016-11-23 MED ORDER — PREDNISONE 20 MG PO TABS: 40.0000 mg | ORAL_TABLET | Freq: Every day | ORAL | 0 refills | Status: AC

## 2016-11-23 MED ORDER — HYDROCODONE-ACETAMINOPHEN 5-325 MG PO TABS: 1.0000 | ORAL_TABLET | ORAL | 0 refills | Status: AC | PRN

## 2016-11-23 MED ORDER — HYDROCODONE-ACETAMINOPHEN 5-325 MG PO TABS: 1.0000 | ORAL_TABLET | Freq: Once | ORAL | Status: DC

## 2016-11-23 NOTE — ED Provider Notes (Signed)
[facility]-EMERGENCY DEPT Provider Note   CSN: [id number] Arrival date & time: 11/23/16  0825     History   Chief Complaint Chief Complaint  Patient presents with  . Knee Pain    HPI Tammy [name] is a 61 y.o. female.  HPI Patient is a 61 year old female presents emergency department complaining of 3-4 days of worsening left knee pain.  She has a long-standing history of gout as well as rheumatoid arthritis.  She's had recurrent left knee pain for years and reports that this is a recent flare.  She denies fevers and chills.  She reports limping gait secondary to pain.  She has not tried any medication for pain.  No recent injury or trauma.  Pain is moderate to severe in severity.  Past Medical History:  Diagnosis Date  . Arthritis   . Asthma   . Bradycardia    2/2 beta blockers  . CAD (coronary artery disease)    myoview 3/13: mild ant ischemia;  LHC 4/13:  LAD < 10%, pRCA 30%, EF 55-65%  . Chronic back pain   . Chronic headaches   . Chronic left hip pain   . Chronic pain of left elbow   . Depression   . Diabetes mellitus   . GERD (gastroesophageal reflux disease)   . Glaucoma   . Hyperlipemia   . Hypertension   . Sleep apnea     Patient Active Problem List   Diagnosis Date Noted  . Nausea alone 09/28/2011  . Dysphagia, unspecified(787.20) 06/24/2011  . Family history of malignant neoplasm of gastrointestinal tract 06/24/2011  . Left hip pain 05/09/2011  . Left elbow pain 05/09/2011  . Altered mental state 05/09/2011  . SHOULDER PAIN, RIGHT 06/05/2010  . LOCALIZED SUPERFICIAL SWELLING MASS OR LUMP 05/14/2010  . HEADACHE 04/24/2010  . CERVICAL STRAIN 04/24/2010  . OBESITY 10/16/2009  . SCIATICA, RIGHT 09/25/2009  . GERD 07/22/2009  . VARICOSE VEINS, LOWER EXTREMITIES 05/07/2009  . SYNCOPE 12/20/2008  . VAGINITIS 07/25/2008  . FATIGUE 05/23/2008  . PALPITATIONS 05/23/2008  . BRADYCARDIA 05/22/2008  . GANGLION CYST, WRIST, RIGHT 12/13/2007  . ABDOMINAL PAIN,  GENERALIZED 12/13/2007  . ARTHRALGIA 07/25/2007  . OTITIS MEDIA, ACUTE, RIGHT 05/18/2007  . DIABETES MELLITUS, TYPE II 01/18/2007  . DEPRESSION 01/18/2007  . OBSTRUCTIVE SLEEP APNEA 01/18/2007  . ASTHMA NOS W/O STATUS ASTHMATICUS 01/18/2007  . HYPERTENSION 12/03/2006  . LICHEN PLANUS 06/02/2004    Past Surgical History:  Procedure Laterality Date  . ABDOMINAL HYSTERECTOMY    . APPENDECTOMY    . CARDIAC CATHETERIZATION  2013   minimal plaque    OB History    No data available       Home Medications    Prior to Admission medications   Medication Sig Start Date End Date Taking? Authorizing Provider  albuterol (PROVENTIL HFA) 108 (90 BASE) MCG/ACT inhaler Inhale 2 puffs into the lungs every 4 (four) hours as needed for wheezing or shortness of breath.   Yes [provider]  amLODipine (NORVASC) 10 MG tablet Take 10 mg by mouth daily.   Yes [provider]  aspirin EC 81 MG tablet Take 81 mg by mouth every morning.    Yes [provider]  bimatoprost (LUMIGAN) 0.03 % ophthalmic solution Place 1 drop into both eyes at bedtime.    Yes [provider]  cloNIDine (CATAPRES) 0.2 MG tablet Take 0.2 mg by mouth 3 (three) times daily.    Yes [provider]  Cyanocobalamin (VITAMIN  B 12 PO) Take 1 tablet by mouth daily.   Yes [provider]  dexlansoprazole (DEXILANT) 60 MG capsule Take 60 mg by mouth every morning.    Yes [provider]  folic acid (FOLVITE) 1 MG tablet Take 1 mg by mouth 2 (two) times daily.    Yes [provider]  hydrochlorothiazide (HYDRODIURIL) 25 MG tablet Take 25 mg by mouth daily.   Yes [provider]  indomethacin (INDOCIN) 25 MG capsule Take 25 mg by mouth 2 (two) times daily with a meal.    Yes [provider]  metFORMIN (GLUCOPHAGE) 500 MG tablet Take 500 mg by mouth daily.    Yes [provider]  nitroGLYCERIN (NITROSTAT) 0.4 MG SL tablet Place 0.4 mg under  the tongue every 5 (five) minutes as needed for chest pain. 06/17/11 03/07/17 Yes Weaver, Scott T, PA-C  Polyethyl Glycol-Propyl Glycol (SYSTANE ULTRA) 0.4-0.3 % SOLN Place 1 drop into both eyes daily.   Yes [provider]  pravastatin (PRAVACHOL) 20 MG tablet Take 20 mg by mouth every evening. 07/13/11 03/07/17 Yes Weaver, Scott T, PA-C  predniSONE (DELTASONE) 10 MG tablet Take 10 mg by mouth daily with breakfast.   Yes [provider]  Timolol Maleate (TIMOPTIC OP) Place 1 drop into both eyes daily.   Yes [provider]  HYDROcodone-acetaminophen (NORCO/VICODIN) 5-325 MG tablet Take 1 tablet by mouth every 4 (four) hours as needed for moderate pain. 11/23/16   Azalia Bilis, MD  predniSONE (DELTASONE) 20 MG tablet Take 2 tablets (40 mg total) by mouth daily. 11/23/16 11/27/16  Azalia Bilis, MD    Family History Family History  Problem Relation Age of Onset  . Colon cancer Brother   . Colon cancer Paternal Aunt   . Stomach cancer Paternal Grandmother   . Heart disease Father   . Heart disease Brother        x3  . Esophageal cancer Neg Hx   . Rectal cancer Neg Hx     Social History Social History  Substance Use Topics  . Smoking status: Former Games developer  . Smokeless tobacco: Never Used  . Alcohol use No     Allergies   Patient has no known allergies.   Review of Systems Review of Systems  All other systems reviewed and are negative.    Physical Exam Updated Vital Signs BP (!) 182/90 (BP Location: Left Arm)   Pulse 69   Temp 98.2 F (36.8 C) (Oral)   Resp 14   SpO2 93%   Physical Exam  Constitutional: She is oriented to person, place, and time. She appears well-developed and well-nourished.  HENT:  Head: Normocephalic.  Eyes: EOM are normal.  Neck: Normal range of motion.  Pulmonary/Chest: Effort normal.  Abdominal: She exhibits no distension.  Musculoskeletal:  Mild pain with range of motion of left knee.  No significant swelling of the  left lower extremity as compared to the right.  Normal pulses left foot.  Mild warmth to the left knee without erythema.  Neurological: She is alert and oriented to person, place, and time.  Psychiatric: She has a normal mood and affect.  Nursing note and vitals reviewed.    ED Treatments / Results  Labs (all labs ordered are listed, but only abnormal results are displayed) Labs Reviewed - No data to display  EKG  EKG Interpretation None       Radiology No results found.  Procedures Procedures (including critical care time)  Medications Ordered  in ED Medications  predniSONE (DELTASONE) tablet 60 mg (not administered)     Initial Impression / Assessment and Plan / ED Course  I have reviewed the triage vital signs and the nursing notes.  Pertinent labs & imaging results that were available during my care of the patient were reviewed by me and considered in my medical decision making (see chart for details).     Suspect flare of her gouty arthritis.  Home with steroids and short course of hydrocodone.  She has anti-inflammatories at home.  Primary care follow-up.  Patient understands to return the emergency department for new or worsening symptoms.  Doubt septic arthritis  Final Clinical Impressions(s) / ED Diagnoses   Final diagnoses:  Acute gout of left knee, unspecified cause    New Prescriptions New Prescriptions   HYDROCODONE-ACETAMINOPHEN (NORCO/VICODIN) 5-325 MG TABLET    Take 1 tablet by mouth every 4 (four) hours as needed for moderate pain.   PREDNISONE (DELTASONE) 20 MG TABLET    Take 2 tablets (40 mg total) by mouth daily.     Azalia Bilis, MD 11/23/16 [phone]

## 2016-11-23 NOTE — ED Triage Notes (Signed)
Pt states that she woke up 4 days ago with atraumatic L knee pain. Alert and oriented.

## 2017-01-10 NOTE — Progress Notes (Signed)
Office Visit Note  Patient: Tammy [name]             Date of Birth: 28-Sep-1955           MRN: [id number]             PCP: Grayce Sessions, NP Referring: Grayce Sessions, NP Visit Date: 01/22/2017 Occupation: @GUAROCC @    Subjective:  Pain in knee joints.   History of Present Illness: Tammy [name] is a 61 y.o. female with history of some sero positive rheumatoid arthritis and osteoarthritis. She returns after her last visit in February 2017. She was treated with methotrexate and Humira in the past. Patient reports she had no help with the medications and came off the medications. She's been having increased pain and discomfort in her bilateral hands and bilateral knee joints. She states her knee joints give out and she falls sometimes. Her grip strength is decreased.  Activities of Daily Living:  Patient reports morning stiffness for all day hours.   Patient Reports nocturnal pain.  Difficulty dressing/grooming: Reports Difficulty climbing stairs: Reports Difficulty getting out of chair: Reports Difficulty using hands for taps, buttons, cutlery, and/or writing: Denies   Review of Systems  Constitutional: Positive for fatigue. Negative for night sweats, weight gain, weight loss and weakness.  HENT: Negative for mouth sores, trouble swallowing, trouble swallowing, mouth dryness and nose dryness.   Eyes: Negative for pain, redness, visual disturbance and dryness.  Respiratory: Negative for cough, shortness of breath and difficulty breathing.   Cardiovascular: Positive for hypertension. Negative for chest pain, palpitations, irregular heartbeat and swelling in legs/feet.  Gastrointestinal: Negative for blood in stool, constipation and diarrhea.  Endocrine: Negative for increased urination.  Genitourinary: Negative for vaginal dryness.  Musculoskeletal: Positive for arthralgias, joint pain, joint swelling, myalgias, morning stiffness and myalgias. Negative for muscle  weakness and muscle tenderness.  Skin: Negative for color change, rash, hair loss, skin tightness, ulcers and sensitivity to sunlight.  Allergic/Immunologic: Negative for susceptible to infections.  Neurological: Negative for dizziness, memory loss and night sweats.  Hematological: Negative for swollen glands.  Psychiatric/Behavioral: Positive for depressed mood and sleep disturbance. The patient is nervous/anxious.     PMFS History:  Patient Active Problem List   Diagnosis Date Noted  . Nausea alone 09/28/2011  . Dysphagia, unspecified(787.20) 06/24/2011  . Family history of malignant neoplasm of gastrointestinal tract 06/24/2011  . Left hip pain 05/09/2011  . Left elbow pain 05/09/2011  . Altered mental state 05/09/2011  . SHOULDER PAIN, RIGHT 06/05/2010  . LOCALIZED SUPERFICIAL SWELLING MASS OR LUMP 05/14/2010  . HEADACHE 04/24/2010  . CERVICAL STRAIN 04/24/2010  . OBESITY 10/16/2009  . SCIATICA, RIGHT 09/25/2009  . GERD 07/22/2009  . VARICOSE VEINS, LOWER EXTREMITIES 05/07/2009  . SYNCOPE 12/20/2008  . VAGINITIS 07/25/2008  . FATIGUE 05/23/2008  . PALPITATIONS 05/23/2008  . BRADYCARDIA 05/22/2008  . GANGLION CYST, WRIST, RIGHT 12/13/2007  . ABDOMINAL PAIN, GENERALIZED 12/13/2007  . ARTHRALGIA 07/25/2007  . OTITIS MEDIA, ACUTE, RIGHT 05/18/2007  . DIABETES MELLITUS, TYPE II 01/18/2007  . DEPRESSION 01/18/2007  . OBSTRUCTIVE SLEEP APNEA 01/18/2007  . ASTHMA NOS W/O STATUS ASTHMATICUS 01/18/2007  . HYPERTENSION 12/03/2006  . LICHEN PLANUS 06/02/2004    Past Medical History:  Diagnosis Date  . Arthritis   . Asthma   . Bradycardia    2/2 beta blockers  . CAD (coronary artery disease)    myoview 3/13: mild ant ischemia;  LHC 4/13:  LAD < 10%, pRCA  30%, EF 55-65%  . Chronic back pain   . Chronic headaches   . Chronic left hip pain   . Chronic pain of left elbow   . Depression   . Diabetes mellitus   . GERD (gastroesophageal reflux disease)   . Glaucoma   .  Hyperlipemia   . Hypertension   . Sleep apnea     Family History  Problem Relation Age of Onset  . Colon cancer Brother   . Colon cancer Paternal Aunt   . Stomach cancer Paternal Grandmother   . Heart disease Father   . Heart disease Brother        x3  . Esophageal cancer Neg Hx   . Rectal cancer Neg Hx    Past Surgical History:  Procedure Laterality Date  . ABDOMINAL HYSTERECTOMY    . APPENDECTOMY    . CARDIAC CATHETERIZATION  2013   minimal plaque   Social History   Social History Narrative   ** Merged History Encounter **         Objective: Vital Signs: BP (!) 152/81 (BP Location: Left Arm, Patient Position: Sitting, Cuff Size: Normal)   Pulse 60   Resp 19   Ht 5\' 6"  (1.676 m)   Wt 235 lb (106.6 kg)   BMI 37.93 kg/m    Physical Exam  Constitutional: She is oriented to person, place, and time. She appears well-developed and well-nourished.  HENT:  Head: Normocephalic and atraumatic.  Eyes: Conjunctivae and EOM are normal.  Neck: Normal range of motion.  Cardiovascular: Normal rate, regular rhythm, normal heart sounds and intact distal pulses.   Pulmonary/Chest: Effort normal and breath sounds normal.  Abdominal: Soft. Bowel sounds are normal.  Lymphadenopathy:    She has no cervical adenopathy.  Neurological: She is alert and oriented to person, place, and time.  Skin: Skin is warm and dry. Capillary refill takes less than 2 seconds.  Psychiatric: She has a normal mood and affect. Her behavior is normal.  Nursing note and vitals reviewed.    Musculoskeletal Exam: Shoulder joint abduction limited to 90 bilaterally. She has bilateral elbow joint contractures about 40. Wrist joints MCPs PIPs DIPs with good range of motion. She does have discomfort range of motion of her MCPs and PIPs and tenderness on palpation. I could not appreciate any synovitis. She is painful range of motion of bilateral hip joints knee joints ankles MTPs PIPs DIPs. I could not appreciate  any warmth or swelling in her joints although she did extreme tenderness. She also had generalized hyperalgesia.  CDAI Exam: CDAI Homunculus Exam:   Tenderness:  RUE: glenohumeral and ulnohumeral and radiohumeral LUE: glenohumeral and ulnohumeral and radiohumeral Right hand: 2nd MCP and 3rd MCP Left hand: 2nd MCP and 3rd MCP RLE: tibiofemoral LLE: tibiofemoral Right foot: 1st MTP, 2nd MTP, 3rd MTP, 4th MTP and 5th MTP Left foot: 1st MTP, 2nd MTP, 3rd MTP, 4th MTP and 5th MTP  Joint Counts:  CDAI Tender Joint count: 10 CDAI Swollen Joint count: 0  Global Assessments:  Patient Global Assessment: 9   CDAI Calculated Score: 19    Investigation: Findings:   1.  October 2015:  CBC, CK, TSH, immunoglobulins, SPEP, TB Gold, ANA, CCP were all negative.  G6PD was low at 6.3.  Hepatitis panel was negative.  Immunoglobulins were normal and ACE level was low.   2.  October 2015:  Ultrasound of her right hand, which was done, was consistent with mild synovitis in her right 2nd  and 3rd MCP joint and also wrist joint.  Median nerve was enlarged at 0.16 cm2.        Imaging: Xr Elbow 2 Views Left  Result Date: 01/22/2017 Mild elbow joint narrowing and spurring was noted. No erosive changes were noted.  Xr Elbow 2 Views Right  Result Date: 01/22/2017 Mild elbow joint narrowing and spurring was noted. No erosive changes were noted.  Xr Foot 2 Views Left  Result Date: 01/22/2017 First MTP narrowing, PIP/DIP narrowing was noted. No other MTPs were involved. No erosive changes were noted. No intertarsal joint space narrowing was noted. A small calcaneal spur was noted. Impression: These findings are consistent with osteoarthritis of the foot.  Xr Foot 2 Views Right  Result Date: 01/22/2017 First MTP narrowing, PIP/DIP narrowing was noted. No other MTPs were involved. No erosive changes were noted. No intertarsal joint space narrowing was noted. A small calcaneal spur was noted.  Impression: These findings are consistent with osteoarthritis of the foot.  Xr Hand 2 View Left  Result Date: 01/22/2017 No MCP joint narrowing was noted. No erosive changes were noted. PIP/DIP narrowing was noted. No intercarpal or radiocarpal joint space narrowing was noted. Impression: These findings are consistent with osteoarthritis of the hand.  Xr Hand 2 View Right  Result Date: 01/22/2017 No MCP joint narrowing was noted. No erosive changes were noted. PIP/DIP narrowing was noted. No intercarpal or radiocarpal joint space narrowing was noted. Impression: These findings are consistent with osteoarthritis of the hand.  Xr Knee 3 View Left  Result Date: 01/22/2017 Severe medial compartment narrowing was noted. Lateral osteophyte was noted. No chondrocalcinosis was noted. Severe patellofemoral narrowing was noted. Impression: These findings are consistent with severe osteoarthritis of the knee.  Xr Knee 3 View Right  Result Date: 01/22/2017 Severe medial compartment narrowing was noted. Lateral osteophyte was noted. No chondrocalcinosis was noted. Severe patellofemoral narrowing was noted. Impression: These findings are consistent with severe osteoarthritis of the knee.   Speciality Comments: No specialty comments available.    Procedures:  No procedures performed Allergies: Patient has no known allergies.   Assessment / Plan:     Visit Diagnoses: Rheumatoid arthritis involving multiple sites with positive rheumatoid factor (HCC) - +RF, -CCP, -ANA, mild synovitis on ultrasound. - Plan: Urinalysis, Routine w reflex microscopic. Patient was diagnosed with rheumatoid arthritis in the past to multiple arthralgias and mild synovitis on the ultrasound. She was treated with methotrexate and inadequate response and then later Humira. Patient stopped the medications herself. she is quite noncompliant compliant during the treatment. She states the medications did not work for her.  G6PD  deficiency (HCC)  Pain in both hands -she has tenderness across MCPs PIPs DIPs with no synovitis was noted. Plan: XR Hand 2 View Right, XR Hand 2 View Left, Sedimentation rate, ANA, Rheumatoid factor, Cyclic citrul peptide antibody, IgG, 14-3-3 eta Protein  Contracture of elbow joint, bilateral  Pain of both elbows -she has tenderness and discomfort range of motion of her elbow joints but no synovitis was appreciated. Plan: XR Elbow 2 Views Right, XR Elbow 2 Views Left  Chronic pain of both knees -she gives history of chronic pain in her knee joint instability leading to frequent falls. Plan: XR KNEE 3 VIEW RIGHT, XR KNEE 3 VIEW LEFT  Pain in both feet -she has tenderness across her MTPs but no synovitis was noted. Plan: XR Foot 2 Views Right, XR Foot 2 Views Left  Noncompliance - With medications and follow-up visits.  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  Other fatigue - Plan: CK, TSH  High risk medication use - Plan: CBC with Differential/Platelet, COMPLETE METABOLIC PANEL WITH GFR, Hepatitis B core antibody, IgM, Hepatitis B surface antigen, Hepatitis C antibody, Quantiferon tb gold assay (blood), HIV antibody  Her other medical problems are listed as follows:  History of hypertension  History of coronary artery disease  History of asthma  History of hypercholesterolemia  History of TIA (transient ischemic attack)  History of gastroesophageal reflux (GERD)  History of diabetes mellitus  History of glaucoma  History of sleep apnea    Orders: Orders Placed This Encounter  Procedures  . XR Hand 2 View Right  . XR Hand 2 View Left  . XR Elbow 2 Views Right  . XR Elbow 2 Views Left  . XR KNEE 3 VIEW RIGHT  . XR KNEE 3 VIEW LEFT  . XR Foot 2 Views Right  . XR Foot 2 Views Left  . CBC with Differential/Platelet  . COMPLETE METABOLIC PANEL WITH GFR  . Urinalysis, Routine w reflex  microscopic  . Sedimentation rate  . CK  . TSH  . ANA  . Rheumatoid factor  . Cyclic citrul peptide antibody, IgG  . 14-3-3 eta Protein  . Hepatitis B core antibody, IgM  . Hepatitis B surface antigen  . Hepatitis C antibody  . Quantiferon tb gold assay (blood)  . HIV antibody   No orders of the defined types were placed in this encounter.   Face-to-face time spent with patient was 40 minutes. Greater than 50% of time was spent in counseling and coordination of care.  Follow-Up Instructions: Return for Polyarthralgia.   Pollyann Savoy, MD  Note - This record has been created using Animal nutritionist.  Chart creation errors have been sought, but may not always  have been located. Such creation errors do not reflect on  the standard of medical care.

## 2017-01-22 ENCOUNTER — Ambulatory Visit (INDEPENDENT_AMBULATORY_CARE_PROVIDER_SITE_OTHER): Payer: No Typology Code available for payment source

## 2017-01-22 ENCOUNTER — Ambulatory Visit (INDEPENDENT_AMBULATORY_CARE_PROVIDER_SITE_OTHER): Payer: No Typology Code available for payment source | Admitting: Rheumatology

## 2017-01-22 ENCOUNTER — Ambulatory Visit (INDEPENDENT_AMBULATORY_CARE_PROVIDER_SITE_OTHER): Payer: Self-pay

## 2017-01-22 ENCOUNTER — Encounter: Payer: Self-pay | Admitting: Rheumatology

## 2017-01-22 VITALS — BP 152/81 | HR 60 | Resp 19 | Ht 66.0 in | Wt 235.0 lb

## 2017-01-22 DIAGNOSIS — Z8673 Personal history of transient ischemic attack (TIA), and cerebral infarction without residual deficits: Secondary | ICD-10-CM

## 2017-01-22 DIAGNOSIS — M25561 Pain in right knee: Secondary | ICD-10-CM

## 2017-01-22 DIAGNOSIS — D550 Anemia due to glucose-6-phosphate dehydrogenase [G6PD] deficiency: Secondary | ICD-10-CM | POA: Diagnosis not present

## 2017-01-22 DIAGNOSIS — M25521 Pain in right elbow: Secondary | ICD-10-CM

## 2017-01-22 DIAGNOSIS — M19072 Primary osteoarthritis, left ankle and foot: Secondary | ICD-10-CM

## 2017-01-22 DIAGNOSIS — M79672 Pain in left foot: Secondary | ICD-10-CM

## 2017-01-22 DIAGNOSIS — R5383 Other fatigue: Secondary | ICD-10-CM

## 2017-01-22 DIAGNOSIS — M79671 Pain in right foot: Secondary | ICD-10-CM | POA: Diagnosis not present

## 2017-01-22 DIAGNOSIS — Z79899 Other long term (current) drug therapy: Secondary | ICD-10-CM

## 2017-01-22 DIAGNOSIS — M0579 Rheumatoid arthritis with rheumatoid factor of multiple sites without organ or systems involvement: Secondary | ICD-10-CM

## 2017-01-22 DIAGNOSIS — M79641 Pain in right hand: Secondary | ICD-10-CM

## 2017-01-22 DIAGNOSIS — M25522 Pain in left elbow: Secondary | ICD-10-CM

## 2017-01-22 DIAGNOSIS — G8929 Other chronic pain: Secondary | ICD-10-CM

## 2017-01-22 DIAGNOSIS — Z9119 Patient's noncompliance with other medical treatment and regimen: Secondary | ICD-10-CM

## 2017-01-22 DIAGNOSIS — Z8669 Personal history of other diseases of the nervous system and sense organs: Secondary | ICD-10-CM

## 2017-01-22 DIAGNOSIS — M79642 Pain in left hand: Secondary | ICD-10-CM

## 2017-01-22 DIAGNOSIS — M24529 Contracture, unspecified elbow: Secondary | ICD-10-CM

## 2017-01-22 DIAGNOSIS — M170 Bilateral primary osteoarthritis of knee: Secondary | ICD-10-CM

## 2017-01-22 DIAGNOSIS — M25562 Pain in left knee: Secondary | ICD-10-CM | POA: Diagnosis not present

## 2017-01-22 DIAGNOSIS — M19071 Primary osteoarthritis, right ankle and foot: Secondary | ICD-10-CM

## 2017-01-22 DIAGNOSIS — Z91199 Patient's noncompliance with other medical treatment and regimen due to unspecified reason: Secondary | ICD-10-CM

## 2017-01-22 DIAGNOSIS — Z8719 Personal history of other diseases of the digestive system: Secondary | ICD-10-CM

## 2017-01-22 DIAGNOSIS — Z8709 Personal history of other diseases of the respiratory system: Secondary | ICD-10-CM

## 2017-01-22 DIAGNOSIS — Z8679 Personal history of other diseases of the circulatory system: Secondary | ICD-10-CM

## 2017-01-22 DIAGNOSIS — D75A Glucose-6-phosphate dehydrogenase (G6PD) deficiency without anemia: Secondary | ICD-10-CM

## 2017-01-22 DIAGNOSIS — Z8639 Personal history of other endocrine, nutritional and metabolic disease: Secondary | ICD-10-CM

## 2017-01-25 NOTE — Progress Notes (Signed)
Will discuss at follow up visit

## 2017-01-26 LAB — CBC WITH DIFFERENTIAL/PLATELET
BASOS PCT: 0.8 %
Basophils Absolute: 40 cells/uL (ref 0–200)
EOS ABS: 130 {cells}/uL (ref 15–500)
Eosinophils Relative: 2.6 %
HEMATOCRIT: 37.4 % (ref 35.0–45.0)
HEMOGLOBIN: 12.5 g/dL (ref 11.7–15.5)
LYMPHS ABS: 2210 {cells}/uL (ref 850–3900)
MCH: 28.3 pg (ref 27.0–33.0)
MCHC: 33.4 g/dL (ref 32.0–36.0)
MCV: 84.8 fL (ref 80.0–100.0)
MPV: 10.2 fL (ref 7.5–12.5)
Monocytes Relative: 7.8 %
NEUTROS ABS: 2230 {cells}/uL (ref 1500–7800)
Neutrophils Relative %: 44.6 %
Platelets: 350 10*3/uL (ref 140–400)
RBC: 4.41 10*6/uL (ref 3.80–5.10)
RDW: 13.6 % (ref 11.0–15.0)
Total Lymphocyte: 44.2 %
WBC: 5 10*3/uL (ref 3.8–10.8)
WBCMIX: 390 {cells}/uL (ref 200–950)

## 2017-01-26 LAB — COMPLETE METABOLIC PANEL WITH GFR
AG RATIO: 1.4 (calc) (ref 1.0–2.5)
ALBUMIN MSPROF: 4 g/dL (ref 3.6–5.1)
ALT: 16 U/L (ref 6–29)
AST: 15 U/L (ref 10–35)
Alkaline phosphatase (APISO): 56 U/L (ref 33–130)
BILIRUBIN TOTAL: 0.3 mg/dL (ref 0.2–1.2)
BUN / CREAT RATIO: 37 (calc) — AB (ref 6–22)
BUN: 18 mg/dL (ref 7–25)
CHLORIDE: 107 mmol/L (ref 98–110)
CO2: 27 mmol/L (ref 20–32)
Calcium: 9.3 mg/dL (ref 8.6–10.4)
Creat: 0.49 mg/dL — ABNORMAL LOW (ref 0.50–1.05)
GFR, EST AFRICAN AMERICAN: 124 mL/min/{1.73_m2} (ref >=60)
GFR, Est Non African American: 107 mL/min/{1.73_m2} (ref >=60)
GLUCOSE: 101 mg/dL — AB (ref 65–99)
Globulin: 2.8 g/dL (calc) (ref 1.9–3.7)
Potassium: 4.1 mmol/L (ref 3.5–5.3)
Sodium: 142 mmol/L (ref 135–146)
TOTAL PROTEIN: 6.8 g/dL (ref 6.1–8.1)

## 2017-01-26 LAB — ANTI-NUCLEAR AB-TITER (ANA TITER): ANA Titer 1: 1:160 {titer} — ABNORMAL HIGH

## 2017-01-26 LAB — QUANTIFERON TB GOLD ASSAY (BLOOD)
QUANTIFERON NIL VALUE: 0.04 [IU]/mL
QUANTIFERON(R)-TB GOLD: NEGATIVE

## 2017-01-26 LAB — URINALYSIS, ROUTINE W REFLEX MICROSCOPIC
Bilirubin Urine: NEGATIVE
GLUCOSE, UA: NEGATIVE
HGB URINE DIPSTICK: NEGATIVE
KETONES UR: NEGATIVE
LEUKOCYTES UA: NEGATIVE
Nitrite: NEGATIVE
Protein, ur: NEGATIVE
SPECIFIC GRAVITY, URINE: 1.024 (ref 1.001–1.03)

## 2017-01-26 LAB — HEPATITIS B CORE ANTIBODY, IGM: HEP B C IGM: NONREACTIVE

## 2017-01-26 LAB — ANA: ANA: POSITIVE — AB

## 2017-01-26 LAB — CK: Total CK: 73 U/L (ref 29–143)

## 2017-01-26 LAB — 14-3-3 ETA PROTEIN: 14-3-3 eta Protein: <0.2 ng/mL (ref <0.2)

## 2017-01-26 LAB — HEPATITIS B SURFACE ANTIGEN: HEP B S AG: NONREACTIVE

## 2017-01-26 LAB — HEPATITIS C ANTIBODY
HEP C AB: NONREACTIVE
SIGNAL TO CUT-OFF: 0.01 (ref <1.00)

## 2017-01-26 LAB — CYCLIC CITRUL PEPTIDE ANTIBODY, IGG

## 2017-01-26 LAB — SEDIMENTATION RATE: Sed Rate: 28 mm/h (ref 0–30)

## 2017-01-26 LAB — TSH: TSH: 2.98 mIU/L (ref 0.40–4.50)

## 2017-01-26 LAB — HIV ANTIBODY (ROUTINE TESTING W REFLEX): HIV 1&2 Ab, 4th Generation: NONREACTIVE

## 2017-01-26 LAB — RHEUMATOID FACTOR: RHEUMATOID FACTOR: 54 [IU]/mL — AB (ref <14)

## 2017-01-26 NOTE — Progress Notes (Signed)
She has positive rheumatoid factor. If she develops increased pain and swelling we can start her on treatment earlier otherwise we will see her back in follow-up visit.

## 2017-06-16 ENCOUNTER — Other Ambulatory Visit: Payer: No Typology Code available for payment source | Admitting: Rheumatology

## 2017-06-21 NOTE — Progress Notes (Deleted)
Office Visit Note  Patient: Tammy [name]             Date of Birth: 28-Oct-1955           MRN: [id number]             PCP: Grayce Sessions, NP Referring: Grayce Sessions, NP Visit Date: 07/02/2017 Occupation: @GUAROCC @    Subjective:  No chief complaint on file.   History of Present Illness: Tammy [name] is a 62 y.o. female ***   Activities of Daily Living:  Patient reports morning stiffness for *** {minute/hour:19697}.   Patient {ACTIONS;DENIES/REPORTS:21021675::"Denies"} nocturnal pain.  Difficulty dressing/grooming: {ACTIONS;DENIES/REPORTS:21021675::"Denies"} Difficulty climbing stairs: {ACTIONS;DENIES/REPORTS:21021675::"Denies"} Difficulty getting out of chair: {ACTIONS;DENIES/REPORTS:21021675::"Denies"} Difficulty using hands for taps, buttons, cutlery, and/or writing: {ACTIONS;DENIES/REPORTS:21021675::"Denies"}   No Rheumatology ROS completed.   PMFS History:  Patient Active Problem List   Diagnosis Date Noted  . Nausea alone 09/28/2011  . Dysphagia, unspecified(787.20) 06/24/2011  . Family history of malignant neoplasm of gastrointestinal tract 06/24/2011  . Left hip pain 05/09/2011  . Left elbow pain 05/09/2011  . Altered mental state 05/09/2011  . SHOULDER PAIN, RIGHT 06/05/2010  . LOCALIZED SUPERFICIAL SWELLING MASS OR LUMP 05/14/2010  . HEADACHE 04/24/2010  . CERVICAL STRAIN 04/24/2010  . OBESITY 10/16/2009  . SCIATICA, RIGHT 09/25/2009  . GERD 07/22/2009  . VARICOSE VEINS, LOWER EXTREMITIES 05/07/2009  . SYNCOPE 12/20/2008  . VAGINITIS 07/25/2008  . FATIGUE 05/23/2008  . PALPITATIONS 05/23/2008  . BRADYCARDIA 05/22/2008  . GANGLION CYST, WRIST, RIGHT 12/13/2007  . ABDOMINAL PAIN, GENERALIZED 12/13/2007  . ARTHRALGIA 07/25/2007  . OTITIS MEDIA, ACUTE, RIGHT 05/18/2007  . DIABETES MELLITUS, TYPE II 01/18/2007  . DEPRESSION 01/18/2007  . OBSTRUCTIVE SLEEP APNEA 01/18/2007  . ASTHMA NOS W/O STATUS ASTHMATICUS 01/18/2007  .  HYPERTENSION 12/03/2006  . LICHEN PLANUS 06/02/2004    Past Medical History:  Diagnosis Date  . Arthritis   . Asthma   . Bradycardia    2/2 beta blockers  . CAD (coronary artery disease)    myoview 3/13: mild ant ischemia;  LHC 4/13:  LAD < 10%, pRCA 30%, EF 55-65%  . Chronic back pain   . Chronic headaches   . Chronic left hip pain   . Chronic pain of left elbow   . Depression   . Diabetes mellitus   . GERD (gastroesophageal reflux disease)   . Glaucoma   . Hyperlipemia   . Hypertension   . Sleep apnea     Family History  Problem Relation Age of Onset  . Colon cancer Brother   . Colon cancer Paternal Aunt   . Stomach cancer Paternal Grandmother   . Heart disease Father   . Heart disease Brother        x3  . Esophageal cancer Neg Hx   . Rectal cancer Neg Hx    Past Surgical History:  Procedure Laterality Date  . ABDOMINAL HYSTERECTOMY    . APPENDECTOMY    . CARDIAC CATHETERIZATION  2013   minimal plaque   Social History   Social History Narrative   ** Merged History Encounter **         Objective: Vital Signs: There were no vitals taken for this visit.   Physical Exam   Musculoskeletal Exam: ***  CDAI Exam: No CDAI exam completed.    Investigation: No additional findings. CBC Latest Ref Rng & Units 01/22/2017 11/01/2013 04/08/2013  WBC 3.8 - 10.8 Thousand/uL 5.0 6.0 7.1  Hemoglobin 11.7 - 15.5  g/dL 00.9 38.1 11.8(L)  Hematocrit 35.0 - 45.0 % 37.4 40.6 35.5(L)  Platelets 140 - 400 Thousand/uL 350 388 332   CMP Latest Ref Rng & Units 01/22/2017 11/01/2013 04/08/2013  Glucose 65 - 99 mg/dL 829(H) 371(I) 967(E)  BUN 7 - 25 mg/dL 18 14 15   Creatinine 0.50 - 1.05 mg/dL ) 9.38(B 0.17  Sodium 135 - 146 mmol/L 142 141 146  Potassium 3.5 - 5.3 mmol/L 4.1 4.2 3.9  Chloride 98 - 110 mmol/L 107 103 109  CO2 20 - 32 mmol/L 27 26 25   Calcium 8.6 - 10.4 mg/dL 9.3 9.6 8.9  Total Protein 6.1 - 8.1 g/dL 6.8 8.1 -  Total Bilirubin 0.2 - 1.2 mg/dL 0.3 5.10) -    Alkaline Phos 39 - 117 U/L - 77 -  AST 10 - 35 U/L 15 22 -  ALT 6 - 29 U/L 16 23 -    Imaging: No results found.  Speciality Comments: No specialty comments available.    Procedures:  No procedures performed Allergies: Patient has no known allergies.   Assessment / Plan:     Visit Diagnoses: No diagnosis found.    Orders: No orders of the defined types were placed in this encounter.  No orders of the defined types were placed in this encounter.   Face-to-face time spent with patient was *** minutes. 50% of time was spent in counseling and coordination of care.  Follow-Up Instructions: No follow-ups on file.   , CMA  Note - This record has been created using 2.5(E.  Chart creation errors have been sought, but may not always  have been located. Such creation errors do not reflect on  the standard of medical care.

## 2017-07-02 ENCOUNTER — Ambulatory Visit: Payer: No Typology Code available for payment source | Admitting: Rheumatology

## 2017-09-28 NOTE — Progress Notes (Deleted)
Assessment / Plan:     Visit Diagnoses: Rheumatoid arthritis involving multiple sites with positive rheumatoid factor (HCC) - +RF, -CCP, -ANA, mild synovitis on ultrasound. - Plan: Urinalysis, Routine w reflex microscopic. Patient was diagnosed with rheumatoid arthritis in the past to multiple arthralgias and mild synovitis on the ultrasound. She was treated with methotrexate and inadequate response and then later Humira. Patient stopped the medications herself. she is quite noncompliant compliant during the treatment. She states the medications did not work for her.  G6PD deficiency (HCC)  Pain in both hands -she has tenderness across MCPs PIPs DIPs with no synovitis was noted. Plan: XR Hand 2 View Right, XR Hand 2 View Left, Sedimentation rate, ANA, Rheumatoid factor, Cyclic citrul peptide antibody, IgG, 14-3-3 eta Protein  Contracture of elbow joint, bilateral  Pain of both elbows -she has tenderness and discomfort range of motion of her elbow joints but no synovitis was appreciated. Plan: XR Elbow 2 Views Right, XR Elbow 2 Views Left  Chronic pain of both knees -she gives history of chronic pain in her knee joint instability leading to frequent falls. Plan: XR KNEE 3 VIEW RIGHT, XR KNEE 3 VIEW LEFT  Pain in both feet -she has tenderness across her MTPs but no synovitis was noted. Plan: XR Foot 2 Views Right, XR Foot 2 Views Left  Noncompliance - With medications and follow-up visits.

## 2017-10-01 NOTE — Progress Notes (Deleted)
Office Visit Note  Patient: Tammy [name]             Date of Birth: 1955/04/11           MRN: [id number]             PCP: Grayce Sessions, NP Referring: Grayce Sessions, NP Visit Date: 10/15/2017 Occupation: @GUAROCC @  Subjective:  No chief complaint on file.   History of Present Illness: Tammy [name] is a 62 y.o. female ***   Activities of Daily Living:  Patient reports morning stiffness for *** {minute/hour:19697}.   Patient {ACTIONS;DENIES/REPORTS:21021675::"Denies"} nocturnal pain.  Difficulty dressing/grooming: {ACTIONS;DENIES/REPORTS:21021675::"Denies"} Difficulty climbing stairs: {ACTIONS;DENIES/REPORTS:21021675::"Denies"} Difficulty getting out of chair: {ACTIONS;DENIES/REPORTS:21021675::"Denies"} Difficulty using hands for taps, buttons, cutlery, and/or writing: {ACTIONS;DENIES/REPORTS:21021675::"Denies"}  No Rheumatology ROS completed.   PMFS History:  Patient Active Problem List   Diagnosis Date Noted  . Nausea alone 09/28/2011  . Dysphagia, unspecified(787.20) 06/24/2011  . Family history of malignant neoplasm of gastrointestinal tract 06/24/2011  . Left hip pain 05/09/2011  . Left elbow pain 05/09/2011  . Altered mental state 05/09/2011  . SHOULDER PAIN, RIGHT 06/05/2010  . LOCALIZED SUPERFICIAL SWELLING MASS OR LUMP 05/14/2010  . HEADACHE 04/24/2010  . CERVICAL STRAIN 04/24/2010  . OBESITY 10/16/2009  . SCIATICA, RIGHT 09/25/2009  . GERD 07/22/2009  . VARICOSE VEINS, LOWER EXTREMITIES 05/07/2009  . SYNCOPE 12/20/2008  . VAGINITIS 07/25/2008  . FATIGUE 05/23/2008  . PALPITATIONS 05/23/2008  . BRADYCARDIA 05/22/2008  . GANGLION CYST, WRIST, RIGHT 12/13/2007  . ABDOMINAL PAIN, GENERALIZED 12/13/2007  . ARTHRALGIA 07/25/2007  . OTITIS MEDIA, ACUTE, RIGHT 05/18/2007  . DIABETES MELLITUS, TYPE II 01/18/2007  . DEPRESSION 01/18/2007  . OBSTRUCTIVE SLEEP APNEA 01/18/2007  . ASTHMA NOS W/O STATUS ASTHMATICUS 01/18/2007  . HYPERTENSION  12/03/2006  . LICHEN PLANUS 06/02/2004    Past Medical History:  Diagnosis Date  . Arthritis   . Asthma   . Bradycardia    2/2 beta blockers  . CAD (coronary artery disease)    myoview 3/13: mild ant ischemia;  LHC 4/13:  LAD < 10%, pRCA 30%, EF 55-65%  . Chronic back pain   . Chronic headaches   . Chronic left hip pain   . Chronic pain of left elbow   . Depression   . Diabetes mellitus   . GERD (gastroesophageal reflux disease)   . Glaucoma   . Hyperlipemia   . Hypertension   . Sleep apnea     Family History  Problem Relation Age of Onset  . Colon cancer Brother   . Colon cancer Paternal Aunt   . Stomach cancer Paternal Grandmother   . Heart disease Father   . Heart disease Brother        x3  . Esophageal cancer Neg Hx   . Rectal cancer Neg Hx    Past Surgical History:  Procedure Laterality Date  . ABDOMINAL HYSTERECTOMY    . APPENDECTOMY    . CARDIAC CATHETERIZATION  2013   minimal plaque   Social History   Social History Narrative   ** Merged History Encounter **        Objective: Vital Signs: There were no vitals taken for this visit.   Physical Exam   Musculoskeletal Exam: ***  CDAI Exam: No CDAI exam completed.   Investigation: No additional findings.  Imaging: No results found.  Recent Labs: Lab Results  Component Value Date   WBC 5.0 01/22/2017   HGB 12.5 01/22/2017   PLT 350  01/22/2017   NA 142 01/22/2017   K 4.1 01/22/2017   CL 107 01/22/2017   CO2 27 01/22/2017   GLUCOSE 101 (H) 01/22/2017   BUN 18 01/22/2017   CREATININE 0.49 (L) 01/22/2017   BILITOT 0.3 01/22/2017   ALKPHOS 77 11/01/2013   AST 15 01/22/2017   ALT 16 01/22/2017   PROT 6.8 01/22/2017   ALBUMIN 3.8 11/01/2013   CALCIUM 9.3 01/22/2017   GFRAA 124 01/22/2017    Speciality Comments: No specialty comments available.  Procedures:  No procedures performed Allergies: Patient has no known allergies.   Assessment / Plan:     Visit Diagnoses: No diagnosis  found.   Orders: No orders of the defined types were placed in this encounter.  No orders of the defined types were placed in this encounter.   Face-to-face time spent with patient was *** minutes. Greater than 50% of time was spent in counseling and coordination of care.  Follow-Up Instructions: No follow-ups on file.   Ellen Henri, CMA  Note - This record has been created using Animal nutritionist.  Chart creation errors have been sought, but may not always  have been located. Such creation errors do not reflect on  the standard of medical care.

## 2017-10-06 ENCOUNTER — Other Ambulatory Visit: Payer: No Typology Code available for payment source | Admitting: Rheumatology

## 2017-10-15 ENCOUNTER — Ambulatory Visit: Payer: No Typology Code available for payment source | Admitting: Rheumatology

## 2018-09-17 ENCOUNTER — Other Ambulatory Visit: Payer: Self-pay

## 2018-09-17 ENCOUNTER — Encounter (HOSPITAL_COMMUNITY): Payer: Self-pay | Admitting: Emergency Medicine

## 2018-09-17 ENCOUNTER — Emergency Department (HOSPITAL_COMMUNITY)
Admission: EM | Admit: 2018-09-17 | Discharge: 2018-09-17 | Disposition: A | Discharge status: Home / Self Care | Payer: No Typology Code available for payment source | Attending: Emergency Medicine | Admitting: Emergency Medicine

## 2018-09-17 ENCOUNTER — Emergency Department (HOSPITAL_COMMUNITY): Payer: No Typology Code available for payment source

## 2018-09-17 DIAGNOSIS — I10 Essential (primary) hypertension: Secondary | ICD-10-CM | POA: Insufficient documentation

## 2018-09-17 DIAGNOSIS — J705 Respiratory conditions due to smoke inhalation: Secondary | ICD-10-CM | POA: Insufficient documentation

## 2018-09-17 DIAGNOSIS — R519 Headache, unspecified: Secondary | ICD-10-CM

## 2018-09-17 DIAGNOSIS — Z7984 Long term (current) use of oral hypoglycemic drugs: Secondary | ICD-10-CM | POA: Insufficient documentation

## 2018-09-17 DIAGNOSIS — T59811A Toxic effect of smoke, accidental (unintentional), initial encounter: Secondary | ICD-10-CM

## 2018-09-17 DIAGNOSIS — R51 Headache: Secondary | ICD-10-CM | POA: Insufficient documentation

## 2018-09-17 DIAGNOSIS — I2510 Atherosclerotic heart disease of native coronary artery without angina pectoris: Secondary | ICD-10-CM | POA: Insufficient documentation

## 2018-09-17 DIAGNOSIS — E119 Type 2 diabetes mellitus without complications: Secondary | ICD-10-CM | POA: Insufficient documentation

## 2018-09-17 DIAGNOSIS — Z7982 Long term (current) use of aspirin: Secondary | ICD-10-CM | POA: Insufficient documentation

## 2018-09-17 DIAGNOSIS — Z87891 Personal history of nicotine dependence: Secondary | ICD-10-CM | POA: Insufficient documentation

## 2018-09-17 DIAGNOSIS — Z79899 Other long term (current) drug therapy: Secondary | ICD-10-CM | POA: Insufficient documentation

## 2018-09-17 LAB — COOXEMETRY PANEL
Carboxyhemoglobin: 0.2 % — ABNORMAL LOW (ref 0.5–1.5)
Methemoglobin: 1.8 % — ABNORMAL HIGH (ref 0.0–1.5)
O2 Saturation: 98.2 %
Total hemoglobin: 12.5 g/dL (ref 12.0–16.0)

## 2018-09-17 MED ORDER — METOCLOPRAMIDE HCL 10 MG PO TABS
10.0000 mg | ORAL_TABLET | Freq: Once | ORAL | Status: AC
  Administered 2018-09-17: 02:00:00 10 mg via ORAL
  Filled 2018-09-17: qty 1

## 2018-09-17 MED ORDER — DIPHENHYDRAMINE HCL 50 MG/ML IJ SOLN
12.5000 mg | Freq: Once | INTRAMUSCULAR | Status: AC
  Administered 2018-09-17: 12.5 mg via INTRAVENOUS
  Filled 2018-09-17: qty 1

## 2018-09-17 MED ORDER — KETOROLAC TROMETHAMINE 30 MG/ML IJ SOLN
15.0000 mg | Freq: Once | INTRAMUSCULAR | Status: AC
  Administered 2018-09-17: 15 mg via INTRAVENOUS
  Filled 2018-09-17: qty 1
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
## 2018-09-17 NOTE — ED Notes (Signed)
Non-rebreather removed. 
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
## 2018-12-08 ENCOUNTER — Other Ambulatory Visit (HOSPITAL_BASED_OUTPATIENT_CLINIC_OR_DEPARTMENT_OTHER): Payer: Self-pay

## 2018-12-08 DIAGNOSIS — G4730 Sleep apnea, unspecified: Secondary | ICD-10-CM

## 2018-12-08 DIAGNOSIS — R5383 Other fatigue: Secondary | ICD-10-CM

## 2018-12-08 DIAGNOSIS — G4710 Hypersomnia, unspecified: Secondary | ICD-10-CM

## 2018-12-08 DIAGNOSIS — R0683 Snoring: Secondary | ICD-10-CM

## 2018-12-15 ENCOUNTER — Other Ambulatory Visit (HOSPITAL_COMMUNITY)
Admission: RE | Admit: 2018-12-15 | Discharge: 2018-12-15 | Disposition: A | Discharge status: Home / Self Care | Payer: Self-pay | Source: Ambulatory Visit | Attending: Internal Medicine | Admitting: Internal Medicine

## 2018-12-15 DIAGNOSIS — Z01812 Encounter for preprocedural laboratory examination: Secondary | ICD-10-CM | POA: Insufficient documentation

## 2018-12-15 DIAGNOSIS — Z20828 Contact with and (suspected) exposure to other viral communicable diseases: Secondary | ICD-10-CM | POA: Insufficient documentation

## 2018-12-16 LAB — NOVEL CORONAVIRUS, NAA (HOSP ORDER, SEND-OUT TO REF LAB; TAT 18-24 HRS): SARS-CoV-2, NAA: NOT DETECTED

## 2018-12-18 ENCOUNTER — Other Ambulatory Visit: Payer: Self-pay

## 2018-12-18 ENCOUNTER — Ambulatory Visit (HOSPITAL_BASED_OUTPATIENT_CLINIC_OR_DEPARTMENT_OTHER): Payer: Self-pay | Attending: *Deleted | Admitting: Internal Medicine

## 2018-12-18 DIAGNOSIS — G4739 Other sleep apnea: Secondary | ICD-10-CM | POA: Insufficient documentation

## 2018-12-18 DIAGNOSIS — G4730 Sleep apnea, unspecified: Secondary | ICD-10-CM

## 2018-12-18 DIAGNOSIS — R51 Headache: Secondary | ICD-10-CM | POA: Insufficient documentation

## 2018-12-18 DIAGNOSIS — R5383 Other fatigue: Secondary | ICD-10-CM | POA: Insufficient documentation

## 2018-12-18 DIAGNOSIS — G4710 Hypersomnia, unspecified: Secondary | ICD-10-CM | POA: Insufficient documentation

## 2018-12-18 DIAGNOSIS — R0683 Snoring: Secondary | ICD-10-CM | POA: Insufficient documentation

## 2018-12-20 DIAGNOSIS — R0683 Snoring: Secondary | ICD-10-CM

## 2018-12-20 NOTE — Procedures (Signed)
Patient Name: Tammy [name], Tammy [name] Date: 12/18/2018 Gender: Female D.O.B: [birth year] Age (years): 38 Referring Provider: Marliss Coots NP Height (inches): 66 Interpreting Physician: Baird Lyons MD, ABSM Weight (lbs): 235 RPSGT: Gwenyth Allegra BMI: 38 MRN: [id number] Neck Size: 15.50  CLINICAL INFORMATION Sleep Study Type: NPSG Indication for sleep study: Excessive Daytime Sleepiness, Fatigue, Snoring Epworth Sleepiness Score: 7  SLEEP STUDY TECHNIQUE As per the AASM Manual for the Scoring of Sleep and Associated Events v2.3 (April 2016) with a hypopnea requiring 4% desaturations.  The channels recorded and monitored were frontal, central and occipital EEG, electrooculogram (EOG), submentalis EMG (chin), nasal and oral airflow, thoracic and abdominal wall motion, anterior tibialis EMG, snore microphone, electrocardiogram, and pulse oximetry.  MEDICATIONS Medications self-administered by patient taken the night of the study : none reported  SLEEP ARCHITECTURE The study was initiated at 9:55:06 PM and ended at 4:04:57 AM.  Sleep onset time was 39.3 minutes and the sleep efficiency was 41.4%%. The total sleep time was 153 minutes.  Stage REM latency was 150.0 minutes.  The patient spent 4.6%% of the night in stage N1 sleep, 91.2%% in stage N2 sleep, 0.0%% in stage N3 and 4.3% in REM.  Alpha intrusion was absent.  Supine sleep was 0.00%.  RESPIRATORY PARAMETERS The overall apnea/hypopnea index (AHI) was 4.7 per hour. There were 0 total apneas, including 0 obstructive, 0 central and 0 mixed apneas. There were 12 hypopneas and 2 RERAs.  The AHI during Stage REM sleep was 46.2 per hour.  AHI while supine was N/A per hour.  The mean oxygen saturation was 91.2%. The minimum SpO2 during sleep was 82.0%.  moderate snoring was noted during this study.  CARDIAC DATA The 2 lead EKG demonstrated sinus rhythm. The mean heart rate was 57.5 beats per minute. Other EKG  findings include: None.  LEG MOVEMENT DATA The total PLMS were 0 with a resulting PLMS index of 0.0. Associated arousal with leg movement index was 0.0 .  IMPRESSIONS - No significant obstructive sleep apnea occurred during this study (AHI = 4.7/h). - Poor sleep efficiencywith several sustained, nonspecific awakenings. - No significant central sleep apnea occurred during this study (CAI = 0.0/h). - Mild oxygen desaturation was noted during this study (Min O2 = 82.0%). Mean sat 91.2%. Time with saturation 88% or less was 6.4 minutes. - The patient snored with moderate snoring volume. - No cardiac abnormalities were noted during this study. - Clinically significant periodic limb movements did not occur during sleep. No significant associated arousals.  DIAGNOSIS - Primary snoring  RECOMMENDATIONS - Apnea events were within the upper limits of normal. Consider re-evaluation in the future if indicated. - Sleep quality on this study night was poor and interrupted. If managed for insomnia, consider evaluating for nocturnal hypoxemia to avoid respiratory depression. - Be careful with alcohol, sedatives and other CNS depressants that may worsen sleep apnea and disrupt normal sleep architecture. - Sleep hygiene should be reviewed to assess factors that may improve sleep quality. - Weight management and regular exercise should be initiated or continued if appropriate.  [Electronically signed] 12/20/2018 03:16 PM  Baird Lyons MD, [place], American Board of Sleep Medicine   NPI: [id number]                          [place], [place] [facility]  ELECTRONICALLY SIGNED ON:  12/20/2018, 3:11 PM [place]: [phone]  FX: [phone] [place]

## 2019-02-20 ENCOUNTER — Other Ambulatory Visit: Payer: Self-pay

## 2019-02-20 DIAGNOSIS — Z20822 Contact with and (suspected) exposure to covid-19: Secondary | ICD-10-CM

## 2019-02-21 LAB — NOVEL CORONAVIRUS, NAA: SARS-CoV-2, NAA: DETECTED — AB

## 2019-02-22 ENCOUNTER — Telehealth: Payer: Self-pay | Admitting: Nurse Practitioner

## 2019-02-22 NOTE — Telephone Encounter (Signed)
Called to Discuss with patient about Covid symptoms and the use of bamlanivimab, a monoclonal antibody infusion for those with mild to moderate Covid symptoms and at a high risk of hospitalization.     Pt is qualified for this infusion at the [facility] infusion center due to co-morbid conditions and/or a member of an at-risk group.    Problem list being actively managed:  Patient Active Problem List   Diagnosis Date Noted  . Nausea alone 09/28/2011  . Dysphagia, unspecified(787.20) 06/24/2011  . Family history of malignant neoplasm of gastrointestinal tract 06/24/2011  . Left hip pain 05/09/2011  . Left elbow pain 05/09/2011  . Altered mental state 05/09/2011  . SHOULDER PAIN, RIGHT 06/05/2010  . LOCALIZED SUPERFICIAL SWELLING MASS OR LUMP 05/14/2010  . HEADACHE 04/24/2010  . CERVICAL STRAIN 04/24/2010  . OBESITY 10/16/2009  . SCIATICA, RIGHT 09/25/2009  . GERD 07/22/2009  . VARICOSE VEINS, LOWER EXTREMITIES 05/07/2009  . SYNCOPE 12/20/2008  . VAGINITIS 07/25/2008  . FATIGUE 05/23/2008  . PALPITATIONS 05/23/2008  . BRADYCARDIA 05/22/2008  . GANGLION CYST, WRIST, RIGHT 12/13/2007  . ABDOMINAL PAIN, GENERALIZED 12/13/2007  . ARTHRALGIA 07/25/2007  . OTITIS MEDIA, ACUTE, RIGHT 05/18/2007  . DIABETES MELLITUS, TYPE II 01/18/2007  . DEPRESSION 01/18/2007  . OBSTRUCTIVE SLEEP APNEA 01/18/2007  . ASTHMA NOS W/O STATUS ASTHMATICUS 01/18/2007  . HYPERTENSION 12/03/2006  . LICHEN PLANUS 89/21/1941      Unable to reach pt

## 2019-12-03 ENCOUNTER — Emergency Department (HOSPITAL_COMMUNITY)
Admission: EM | Admit: 2019-12-03 | Discharge: 2019-12-03 | Disposition: A | Discharge status: Home / Self Care | Payer: 59 | Attending: Emergency Medicine | Admitting: Emergency Medicine

## 2019-12-03 ENCOUNTER — Emergency Department (HOSPITAL_BASED_OUTPATIENT_CLINIC_OR_DEPARTMENT_OTHER): Payer: 59

## 2019-12-03 ENCOUNTER — Other Ambulatory Visit: Payer: Self-pay

## 2019-12-03 ENCOUNTER — Encounter (HOSPITAL_COMMUNITY): Payer: Self-pay | Admitting: Emergency Medicine

## 2019-12-03 DIAGNOSIS — E119 Type 2 diabetes mellitus without complications: Secondary | ICD-10-CM | POA: Diagnosis not present

## 2019-12-03 DIAGNOSIS — Z79899 Other long term (current) drug therapy: Secondary | ICD-10-CM | POA: Diagnosis not present

## 2019-12-03 DIAGNOSIS — J45909 Unspecified asthma, uncomplicated: Secondary | ICD-10-CM | POA: Insufficient documentation

## 2019-12-03 DIAGNOSIS — R2242 Localized swelling, mass and lump, left lower limb: Secondary | ICD-10-CM | POA: Diagnosis present

## 2019-12-03 DIAGNOSIS — Z7984 Long term (current) use of oral hypoglycemic drugs: Secondary | ICD-10-CM | POA: Diagnosis not present

## 2019-12-03 DIAGNOSIS — I2510 Atherosclerotic heart disease of native coronary artery without angina pectoris: Secondary | ICD-10-CM | POA: Insufficient documentation

## 2019-12-03 DIAGNOSIS — Z7982 Long term (current) use of aspirin: Secondary | ICD-10-CM | POA: Insufficient documentation

## 2019-12-03 DIAGNOSIS — Z87891 Personal history of nicotine dependence: Secondary | ICD-10-CM | POA: Insufficient documentation

## 2019-12-03 DIAGNOSIS — M7989 Other specified soft tissue disorders: Secondary | ICD-10-CM

## 2019-12-03 DIAGNOSIS — I10 Essential (primary) hypertension: Secondary | ICD-10-CM | POA: Diagnosis not present

## 2019-12-03 NOTE — ED Notes (Signed)
RN reviewed discharge paperwork. Time for questions and answers provided. Patient stable and ambulatory out of ED. Pt did not sign at this time due to signature pad malfunction. I-Li RN

## 2019-12-03 NOTE — Progress Notes (Signed)
Lower extremity venous LT study completed  Preliminary results relayed to provider.   See CV Proc for preliminary results report.   Tammy [name]  

## 2019-12-03 NOTE — Discharge Instructions (Signed)
Your work-up today was negative for blood clot.  Please make sure to follow-up with the rheumatology doctor if your symptoms do not improve.  Return to the ER if your symptoms worsen.

## 2019-12-03 NOTE — ED Triage Notes (Signed)
Patient here from home reporting lump to left leg that started yesterday morning. Denies pain.

## 2019-12-03 NOTE — ED Provider Notes (Signed)
[facility]-EMERGENCY DEPT Provider Note   CSN: [id number] Arrival date & time: 12/03/19  0843     History Chief Complaint  Patient presents with  . Leg Swelling    CLIMMIE CRONCE is a 64 y.o. female.  HPI 64 year old female with a history of asthma, CAD, chronic back pain, GERD, rheumatoid arthritis presents to the ER with complaints of an RA flare, left leg swelling, and a lump that she noticed in her leg.  Patient states that she will have these RA flares and her left leg swelling is consistent with her symptoms.  However she states that she noticed a bump on the side of her calf which is new.  She wanted to come to the ER to have this evaluated.  She denies any recent travel, denies any use of oral estrogen therapy.  She has not had any recent surgeries.  She denies any warmth, pain to the leg or to the lump. She has been taking her medications for RA.     Past Medical History:  Diagnosis Date  . Arthritis   . Asthma   . Bradycardia    2/2 beta blockers  . CAD (coronary artery disease)    myoview 3/13: mild ant ischemia;  LHC 4/13:  LAD < 10%, pRCA 30%, EF 55-65%  . Chronic back pain   . Chronic headaches   . Chronic left hip pain   . Chronic pain of left elbow   . Depression   . Diabetes mellitus   . GERD (gastroesophageal reflux disease)   . Glaucoma   . Hyperlipemia   . Hypertension   . Sleep apnea     Patient Active Problem List   Diagnosis Date Noted  . Nausea alone 09/28/2011  . Dysphagia, unspecified(787.20) 06/24/2011  . Family history of malignant neoplasm of gastrointestinal tract 06/24/2011  . Left hip pain 05/09/2011  . Left elbow pain 05/09/2011  . Altered mental state 05/09/2011  . SHOULDER PAIN, RIGHT 06/05/2010  . LOCALIZED SUPERFICIAL SWELLING MASS OR LUMP 05/14/2010  . HEADACHE 04/24/2010  . CERVICAL STRAIN 04/24/2010  . OBESITY 10/16/2009  . SCIATICA, RIGHT 09/25/2009  . GERD 07/22/2009  . VARICOSE VEINS, LOWER  EXTREMITIES 05/07/2009  . SYNCOPE 12/20/2008  . VAGINITIS 07/25/2008  . FATIGUE 05/23/2008  . PALPITATIONS 05/23/2008  . BRADYCARDIA 05/22/2008  . GANGLION CYST, WRIST, RIGHT 12/13/2007  . ABDOMINAL PAIN, GENERALIZED 12/13/2007  . ARTHRALGIA 07/25/2007  . OTITIS MEDIA, ACUTE, RIGHT 05/18/2007  . DIABETES MELLITUS, TYPE II 01/18/2007  . DEPRESSION 01/18/2007  . OBSTRUCTIVE SLEEP APNEA 01/18/2007  . ASTHMA NOS W/O STATUS ASTHMATICUS 01/18/2007  . HYPERTENSION 12/03/2006  . LICHEN PLANUS 06/02/2004    Past Surgical History:  Procedure Laterality Date  . ABDOMINAL HYSTERECTOMY    . APPENDECTOMY    . CARDIAC CATHETERIZATION  2013   minimal plaque     OB History   No obstetric history on file.     Family History  Problem Relation Age of Onset  . Colon cancer Brother   . Colon cancer Paternal Aunt   . Stomach cancer Paternal Grandmother   . Heart disease Father   . Heart disease Brother        x3  . Esophageal cancer Neg Hx   . Rectal cancer Neg Hx     Social History   Tobacco Use  . Smoking status: Former Games developer  . Smokeless tobacco: Never Used  Vaping Use  . Vaping Use: Never used  Substance  Use Topics  . Alcohol use: No  . Drug use: No    Home Medications Prior to Admission medications   Medication Sig Start Date End Date Taking? Authorizing Provider  amLODipine (NORVASC) 10 MG tablet Take 10 mg by mouth daily.    [provider]  aspirin EC 81 MG tablet Take 81 mg by mouth every morning.     [provider]  bimatoprost (LUMIGAN) 0.03 % ophthalmic solution Place 1 drop into both eyes at bedtime.     [provider]  cloNIDine (CATAPRES) 0.2 MG tablet Take 0.2 mg by mouth 3 (three) times daily.     [provider]  famotidine (PEPCID) 20 MG tablet Take 20 mg by mouth daily.    [provider]  HYDROcodone-acetaminophen (NORCO/VICODIN) 5-325 MG tablet Take 1 tablet by mouth every 4 (four) hours as needed for  moderate pain. Patient not taking: Reported on 01/22/2017 11/23/16   Azalia Bilis, MD  indomethacin (INDOCIN) 25 MG capsule Take 25 mg by mouth 2 (two) times daily with a meal.     [provider]  lisinopril (ZESTRIL) 20 MG tablet Take 20 mg by mouth daily.    [provider]  metFORMIN (GLUCOPHAGE) 500 MG tablet Take 500 mg by mouth daily.     [provider]  Multiple Vitamin (MULTIVITAMIN WITH MINERALS) TABS tablet Take 1 tablet by mouth daily.    [provider]  naproxen sodium (ALEVE) 220 MG tablet Take 220 mg by mouth 2 (two) times daily as needed (pain).    [provider]  nitroGLYCERIN (NITROSTAT) 0.4 MG SL tablet Place 0.4 mg under the tongue every 5 (five) minutes as needed for chest pain. 06/17/11 03/07/17  Tereso Newcomer T, PA-C  pravastatin (PRAVACHOL) 20 MG tablet Take 20 mg by mouth every evening. 07/13/11 03/07/17  Tereso Newcomer T, PA-C    Allergies    Patient has no known allergies.  Review of Systems   Review of Systems  Constitutional: Negative for chills and fever.  Musculoskeletal: Negative for arthralgias and neck stiffness.  Skin: Negative for color change, rash and wound.  Neurological: Negative for weakness.    Physical Exam Updated Vital Signs BP (!) 164/78   Pulse (!) 56   Temp 98.1 F (36.7 C) (Oral)   Resp 16   SpO2 98%   Physical Exam Vitals and nursing note reviewed.  Constitutional:      General: She is not in acute distress.    Appearance: She is well-developed. She is not ill-appearing or diaphoretic.  HENT:     Head: Normocephalic and atraumatic.  Eyes:     Conjunctiva/sclera: Conjunctivae normal.  Cardiovascular:     Rate and Rhythm: Normal rate and regular rhythm.     Heart sounds: No murmur heard.   Pulmonary:     Effort: Pulmonary effort is normal. No respiratory distress.     Breath sounds: Normal breath sounds.  Abdominal:     Palpations: Abdomen is soft.     Tenderness: There is no  abdominal tenderness.  Musculoskeletal:        General: Swelling present. No tenderness.     Cervical back: Neck supple.     Right lower leg: No edema.     Left lower leg: No edema.       Legs:     Comments: Mild nonpitting edema to the left leg. No overlying erythema. Small mobile nontender nodule to the left lateral calf.  No warmth.  2+ DP pulses.  5/5 strength, sensations intact.   Skin:    General: Skin is warm and dry.     Findings: No bruising, erythema or rash.  Neurological:     General: No focal deficit present.     Mental Status: She is alert and oriented to person, place, and time.     Sensory: No sensory deficit.     Motor: No weakness.     ED Results / Procedures / Treatments   Labs (all labs ordered are listed, but only abnormal results are displayed) Labs Reviewed - No data to display  EKG None  Radiology No results found.  Procedures Procedures (including critical care time)  Medications Ordered in ED Medications - No data to display  ED Course  I have reviewed the triage vital signs and the nursing notes.  Pertinent labs & imaging results that were available during my care of the patient were reviewed by me and considered in my medical decision making (see chart for details).    MDM Rules/Calculators/A&P                          64 year old female with left leg swelling and a small nodule to the left lateral calf.  No evidence of cellulitis, erythema, nonpitting edema to the left extremity.  No swelling to the right leg, doubt CHF.  2+ DP pulses.  Gross sensations and strength intact.  DVT study negative for clot.  Do not think there is any evidence of neurovascular compromise to the extremity.  No evidence of infection.  Stable for discharge with close follow-up with her rheumatologist.  Return precautions discussed.  She voiced understanding and is agreeable.  Final Clinical Impression(s) / ED Diagnoses Final diagnoses:  Leg swelling    Rx / DC  Orders ED Discharge Orders    None       Leone Brand 12/03/19 1447    Pollyann Savoy, MD 12/03/19 1955

## 2020-08-21 ENCOUNTER — Emergency Department (HOSPITAL_COMMUNITY)
Admission: EM | Admit: 2020-08-21 | Discharge: 2020-08-21 | Disposition: A | Discharge status: Home / Self Care | Payer: 59 | Attending: Emergency Medicine | Admitting: Emergency Medicine

## 2020-08-21 ENCOUNTER — Other Ambulatory Visit: Payer: Self-pay

## 2020-08-21 ENCOUNTER — Encounter (HOSPITAL_COMMUNITY): Payer: Self-pay | Admitting: Emergency Medicine

## 2020-08-21 DIAGNOSIS — H6690 Otitis media, unspecified, unspecified ear: Secondary | ICD-10-CM

## 2020-08-21 DIAGNOSIS — Z87891 Personal history of nicotine dependence: Secondary | ICD-10-CM | POA: Insufficient documentation

## 2020-08-21 DIAGNOSIS — E119 Type 2 diabetes mellitus without complications: Secondary | ICD-10-CM | POA: Insufficient documentation

## 2020-08-21 DIAGNOSIS — Z79899 Other long term (current) drug therapy: Secondary | ICD-10-CM | POA: Insufficient documentation

## 2020-08-21 DIAGNOSIS — I10 Essential (primary) hypertension: Secondary | ICD-10-CM | POA: Insufficient documentation

## 2020-08-21 DIAGNOSIS — Z9861 Coronary angioplasty status: Secondary | ICD-10-CM | POA: Insufficient documentation

## 2020-08-21 DIAGNOSIS — B309 Viral conjunctivitis, unspecified: Secondary | ICD-10-CM | POA: Insufficient documentation

## 2020-08-21 DIAGNOSIS — Z7984 Long term (current) use of oral hypoglycemic drugs: Secondary | ICD-10-CM | POA: Insufficient documentation

## 2020-08-21 DIAGNOSIS — H6501 Acute serous otitis media, right ear: Secondary | ICD-10-CM | POA: Insufficient documentation

## 2020-08-21 DIAGNOSIS — I2510 Atherosclerotic heart disease of native coronary artery without angina pectoris: Secondary | ICD-10-CM | POA: Insufficient documentation

## 2020-08-21 DIAGNOSIS — J45909 Unspecified asthma, uncomplicated: Secondary | ICD-10-CM | POA: Insufficient documentation

## 2020-08-21 DIAGNOSIS — Z7982 Long term (current) use of aspirin: Secondary | ICD-10-CM | POA: Insufficient documentation

## 2020-08-21 MED ORDER — AMOXICILLIN 500 MG PO CAPS
500.0000 mg | ORAL_CAPSULE | Freq: Once | ORAL | Status: AC
  Administered 2020-08-21: 500 mg via ORAL
  Filled 2020-08-21: qty 1

## 2020-08-21 MED ORDER — FLUORESCEIN SODIUM 1 MG OP STRP
1.0000 | ORAL_STRIP | Freq: Once | OPHTHALMIC | Status: AC
  Administered 2020-08-21: 1 via OPHTHALMIC
  Filled 2020-08-21: qty 1

## 2020-08-21 MED ORDER — TETRACAINE HCL 0.5 % OP SOLN
2.0000 [drp] | Freq: Once | OPHTHALMIC | Status: AC
  Administered 2020-08-21: 2 [drp] via OPHTHALMIC
  Filled 2020-08-21: qty 4

## 2020-08-21 MED ORDER — AMOXICILLIN 500 MG PO CAPS: 500.0000 mg | ORAL_CAPSULE | Freq: Two times a day (BID) | ORAL | 0 refills | Status: AC

## 2020-08-21 NOTE — ED Provider Notes (Signed)
[facility]-EMERGENCY DEPT Provider Note   CSN: [id number] Arrival date & time: 08/21/20  1636     History Chief Complaint  Patient presents with  . Otalgia    right  . Eye Pain    right    Tammy [name] is a 65 y.o. female presenting for evaluation of right ear pain that began this morning.  She states it is an aching pain.  She put some "sweet oil "in her ear this morning which eased her pain.  No fevers, no nasal congestion or sore throat.  No loss of hearing or tinnitus. She also complains of irritation to the right eye.  She states her right upper eyelid feels itchy at times.  It is not causing her pain though.  She is not having any drainage.  No vision changes or associated headache  The history is provided by the patient.       Past Medical History:  Diagnosis Date  . Arthritis   . Asthma   . Bradycardia    2/2 beta blockers  . CAD (coronary artery disease)    myoview 3/13: mild ant ischemia;  LHC 4/13:  LAD < 10%, pRCA 30%, EF 55-65%  . Chronic back pain   . Chronic headaches   . Chronic left hip pain   . Chronic pain of left elbow   . Depression   . Diabetes mellitus   . GERD (gastroesophageal reflux disease)   . Glaucoma   . Hyperlipemia   . Hypertension   . Sleep apnea     Patient Active Problem List   Diagnosis Date Noted  . Nausea alone 09/28/2011  . Dysphagia, unspecified(787.20) 06/24/2011  . Family history of malignant neoplasm of gastrointestinal tract 06/24/2011  . Left hip pain 05/09/2011  . Left elbow pain 05/09/2011  . Altered mental state 05/09/2011  . SHOULDER PAIN, RIGHT 06/05/2010  . LOCALIZED SUPERFICIAL SWELLING MASS OR LUMP 05/14/2010  . HEADACHE 04/24/2010  . CERVICAL STRAIN 04/24/2010  . OBESITY 10/16/2009  . SCIATICA, RIGHT 09/25/2009  . GERD 07/22/2009  . VARICOSE VEINS, LOWER EXTREMITIES 05/07/2009  . SYNCOPE 12/20/2008  . VAGINITIS 07/25/2008  . FATIGUE 05/23/2008  . PALPITATIONS 05/23/2008  .  BRADYCARDIA 05/22/2008  . GANGLION CYST, WRIST, RIGHT 12/13/2007  . ABDOMINAL PAIN, GENERALIZED 12/13/2007  . ARTHRALGIA 07/25/2007  . OTITIS MEDIA, ACUTE, RIGHT 05/18/2007  . DIABETES MELLITUS, TYPE II 01/18/2007  . DEPRESSION 01/18/2007  . OBSTRUCTIVE SLEEP APNEA 01/18/2007  . ASTHMA NOS W/O STATUS ASTHMATICUS 01/18/2007  . HYPERTENSION 12/03/2006  . LICHEN PLANUS 06/02/2004    Past Surgical History:  Procedure Laterality Date  . ABDOMINAL HYSTERECTOMY    . APPENDECTOMY    . CARDIAC CATHETERIZATION  2013   minimal plaque     OB History   No obstetric history on file.     Family History  Problem Relation Age of Onset  . Colon cancer Brother   . Colon cancer Paternal Aunt   . Stomach cancer Paternal Grandmother   . Heart disease Father   . Heart disease Brother        x3  . Esophageal cancer Neg Hx   . Rectal cancer Neg Hx     Social History   Tobacco Use  . Smoking status: Former Games developer  . Smokeless tobacco: Never Used  Vaping Use  . Vaping Use: Never used  Substance Use Topics  . Alcohol use: No  . Drug use: No    Home Medications  Prior to Admission medications   Medication Sig Start Date End Date Taking? Authorizing Provider  amoxicillin (AMOXIL) 500 MG capsule Take 1 capsule (500 mg total) by mouth 2 (two) times daily for 10 days. 08/21/20 08/31/20 Yes Roxan Hockey, [place] N, PA-C  amLODipine (NORVASC) 10 MG tablet Take 10 mg by mouth daily.    [provider]  aspirin EC 81 MG tablet Take 81 mg by mouth every morning.     [provider]  bimatoprost (LUMIGAN) 0.03 % ophthalmic solution Place 1 drop into both eyes at bedtime.     [provider]  cloNIDine (CATAPRES) 0.2 MG tablet Take 0.2 mg by mouth 3 (three) times daily.     [provider]  famotidine (PEPCID) 20 MG tablet Take 20 mg by mouth daily.    [provider]  HYDROcodone-acetaminophen (NORCO/VICODIN) 5-325 MG tablet Take 1 tablet by mouth every 4  (four) hours as needed for moderate pain. Patient not taking: Reported on 01/22/2017 11/23/16   Azalia Bilis, MD  indomethacin (INDOCIN) 25 MG capsule Take 25 mg by mouth 2 (two) times daily with a meal.     [provider]  lisinopril (ZESTRIL) 20 MG tablet Take 20 mg by mouth daily.    [provider]  metFORMIN (GLUCOPHAGE) 500 MG tablet Take 500 mg by mouth daily.     [provider]  Multiple Vitamin (MULTIVITAMIN WITH MINERALS) TABS tablet Take 1 tablet by mouth daily.    [provider]  naproxen sodium (ALEVE) 220 MG tablet Take 220 mg by mouth 2 (two) times daily as needed (pain).    [provider]  nitroGLYCERIN (NITROSTAT) 0.4 MG SL tablet Place 0.4 mg under the tongue every 5 (five) minutes as needed for chest pain. 06/17/11 03/07/17  Tereso Newcomer T, PA-C  pravastatin (PRAVACHOL) 20 MG tablet Take 20 mg by mouth every evening. 07/13/11 03/07/17  Tereso Newcomer T, PA-C    Allergies    Patient has no known allergies.  Review of Systems   Review of Systems  Constitutional: Negative for fever.  HENT: Positive for ear pain. Negative for ear discharge.   Eyes: Positive for redness and itching. Negative for visual disturbance.  Neurological: Negative for headaches.  All other systems reviewed and are negative.   Physical Exam Updated Vital Signs BP (!) 189/100   Pulse 79   Temp 98.3 F (36.8 C) (Oral)   Resp 17   Ht  (1.6 m)   Wt 105.2 kg   SpO2 100%   BMI 41.10 kg/m   Physical Exam Vitals and nursing note reviewed.  Constitutional:      General: She is not in acute distress.    Appearance: She is well-developed.  HENT:     Head: Normocephalic and atraumatic.     Left Ear: Tympanic membrane, ear canal and external ear normal.     Ears:     Comments: Right TM is bulging and erythematous.  No perforation.  External canals normal. Eyes:     Extraocular Movements: Extraocular movements intact.     Pupils: Pupils are  equal, round, and reactive to light.     Comments: Right conjunctive is slightly injected.  No drainage.  No periorbital edema.  Lids are normal. Right eye was visualized under Woods lamp with fluorescein stain, no uptake noted- no abrasions or ulcers, no lesions.  Cardiovascular:     Rate and Rhythm: Normal rate and regular rhythm.  Pulmonary:  Effort: Pulmonary effort is normal.     Breath sounds: Normal breath sounds.  Abdominal:     Palpations: Abdomen is soft.  Skin:    General: Skin is warm.  Neurological:     Mental Status: She is alert.  Psychiatric:        Behavior: Behavior normal.     ED Results / Procedures / Treatments   Labs (all labs ordered are listed, but only abnormal results are displayed) Labs Reviewed - No data to display  EKG None  Radiology No results found.  Procedures Procedures   Medications Ordered in ED Medications  amoxicillin (AMOXIL) capsule 500 mg (has no administration in time range)  tetracaine (PONTOCAINE) 0.5 % ophthalmic solution 2 drop (2 drops Right Eye Given 08/21/20 2229)  fluorescein ophthalmic strip 1 strip (1 strip Right Eye Given 08/21/20 2246)    ED Course  I have reviewed the triage vital signs and the nursing notes.  Pertinent labs & imaging results that were available during my care of the patient were reviewed by me and considered in my medical decision making (see chart for details).    MDM Rules/Calculators/A&P                          Patient with presentation consistent with otitis media of the right with bulging and erythematous TM and ear pain.  She is afebrile.  No abrasions, ulcers, or lesions noted on right eye exam.  She reports some itching sensation to her lid, conjunctiva is mildly injected.  Recommend she treat with cool compresses, hand hygiene, treat symptomatically for suspected viral conjunctivitis. Also trial antihistamines for the itching.   Close PCP follow-up for recheck of her right ear.  Will  prescribe amoxicillin for otitis media.  Return precautions.  Discussed results, findings, treatment and follow up. Patient advised of return precautions. Patient verbalized understanding and agreed with plan.  Final Clinical Impression(s) / ED Diagnoses Final diagnoses:  Viral conjunctivitis of right eye  Acute otitis media, unspecified otitis media type    Rx / DC Orders ED Discharge Orders         Ordered    amoxicillin (AMOXIL) 500 MG capsule  2 times daily        08/21/20 2246           Alfard Cochrane, [place] N, PA-C 08/21/20 2248    Mancel Bale, MD 08/21/20 2336

## 2020-08-21 NOTE — ED Provider Notes (Signed)
Emergency Medicine Provider Triage Evaluation Note  Tammy [name] , a 65 y.o. female  was evaluated in triage.  Pt complains of waking up this morning with right ear pain and redness and drainage from her right eye.  Tried putting a cotton ball soaked with "sweet oil" in it with some improvement in her pain.  No drainage from the area.  Is also concerned about a "spot" on her right breast that has been present for 2 weeks.  Denies any fever or trauma.  Review of Systems  Positive: Spot on breast, right eye redness and tearing, right ear pain Negative: Fever  Physical Exam  BP (!) 158/76 (BP Location: Left Arm)   Pulse 74   Temp 98.3 F (36.8 C) (Oral)   Resp 18   SpO2 97%  Gen:   Awake, no distress   Resp:  Normal effort  MSK:   Moves extremities without difficulty  Other:  No purulent drainage noted from right  Medical Decision Making  Medically screening exam initiated at 5:06 PM.  Appropriate orders placed.  [facility] [place] was informed that the remainder of the evaluation will be completed by another provider, this initial triage assessment does not replace that evaluation, and the importance of remaining in the ED until their evaluation is complete.     Dietrich Pates, PA-C 08/21/20 1707    Terrilee Files, MD 08/22/20 [phone]

## 2020-08-21 NOTE — Discharge Instructions (Addendum)
Take the antibiotic, amoxicillin, as directed until gone. It is not recommended that you the "sweet oil" in your ear, it is unclear what it is made of and I cannot give you proper recommendation. Apply a cool clean wet washcloth to your right eye to help with symptom management.  He can try an oral antihistamine to help with some of the itching.  Avoid scratching as much as possible.  Viral conjunctivitis is very contagious, wash your hands frequently especially after what touching your eye. Follow with your primary care provider to have her recheck your right ear.  If you start getting purulent drainage, severe eye pain, or worsening symptoms regarding your eye, it is important to have reevaluation.

## 2020-08-21 NOTE — ED Triage Notes (Signed)
Pt arriving with complaint of right ear and eye pain that began this morning. Pt reports putting oil in her ear this morning and states is did help some. Pt reports no vision changes in her right eye, but states her eyelid itches at times. States her eye just feels "heavy" like there is a pressure on it.

## 2020-10-26 ENCOUNTER — Ambulatory Visit
Admission: EM | Admit: 2020-10-26 | Discharge: 2020-10-26 | Disposition: A | Discharge status: Home / Self Care | Payer: Self-pay

## 2020-10-26 DIAGNOSIS — J018 Other acute sinusitis: Secondary | ICD-10-CM

## 2020-10-26 DIAGNOSIS — J4530 Mild persistent asthma, uncomplicated: Secondary | ICD-10-CM

## 2020-10-26 DIAGNOSIS — E119 Type 2 diabetes mellitus without complications: Secondary | ICD-10-CM

## 2020-10-26 DIAGNOSIS — R062 Wheezing: Secondary | ICD-10-CM

## 2020-10-26 MED ORDER — CETIRIZINE HCL 10 MG PO TABS: 10.0000 mg | ORAL_TABLET | Freq: Every day | ORAL | 0 refills | Status: DC

## 2020-10-26 MED ORDER — ALBUTEROL SULFATE HFA 108 (90 BASE) MCG/ACT IN AERS: 1.0000 | INHALATION_SPRAY | Freq: Four times a day (QID) | RESPIRATORY_TRACT | 0 refills | Status: DC | PRN

## 2020-10-26 MED ORDER — PREDNISONE 20 MG PO TABS: ORAL_TABLET | ORAL | 0 refills | Status: DC

## 2020-10-26 MED ORDER — AMOXICILLIN 875 MG PO TABS: 875.0000 mg | ORAL_TABLET | Freq: Two times a day (BID) | ORAL | 0 refills | Status: DC

## 2020-10-26 NOTE — ED Triage Notes (Signed)
8 day h/o cough and nasal congestion. Negative at home covid test and negative covid test at [facility] 4 days ago. Has tried OTC sinus and allergy meds without relief. Denies n/v/d.

## 2020-10-26 NOTE — ED Provider Notes (Signed)
[facility]   MRN: [id number] DOB: [birth year]  Subjective:   Tammy [name] is a 65 y.o. female presenting for 8-day history of persistent cough, sinus congestion, malaise and fatigue, sinus pain, facial pain, wheezing.  Patient is a type II diabetic, her blood sugars are in the 120s to 130s.  She has managed with metformin only.  She has had multiple COVID-19 tests that were negative including one at [facility].  Has been using over-the-counter sinus medications without relief.  Denies chest pain, shortness of breath.  She does have asthma, does not have an albuterol inhaler.  No history of heart failure.  No current facility-administered medications for this encounter.  Current Outpatient Medications:    amLODipine (NORVASC) 10 MG tablet, Take 10 mg by mouth daily., Disp: , Rfl:    aspirin EC 81 MG tablet, Take 81 mg by mouth every morning. , Disp: , Rfl:    bimatoprost (LUMIGAN) 0.03 % ophthalmic solution, Place 1 drop into both eyes at bedtime. , Disp: , Rfl:    cloNIDine (CATAPRES) 0.2 MG tablet, Take 0.2 mg by mouth 3 (three) times daily. , Disp: , Rfl:    famotidine (PEPCID) 20 MG tablet, Take 20 mg by mouth daily., Disp: , Rfl:    hydrALAZINE (APRESOLINE) 50 MG tablet, Take 50 mg by mouth 4 (four) times daily., Disp: , Rfl:    HYDROcodone-acetaminophen (NORCO/VICODIN) 5-325 MG tablet, Take 1 tablet by mouth every 4 (four) hours as needed for moderate pain. (Patient not taking: Reported on 01/22/2017), Disp: 12 tablet, Rfl: 0   indomethacin (INDOCIN) 25 MG capsule, Take 25 mg by mouth 2 (two) times daily with a meal., Disp: , Rfl:    lisinopril (ZESTRIL) 20 MG tablet, Take 20 mg by mouth daily., Disp: , Rfl:    metFORMIN (GLUCOPHAGE) 500 MG tablet, Take 500 mg by mouth daily. , Disp: , Rfl:    Multiple Vitamin (MULTIVITAMIN WITH MINERALS) TABS tablet, Take 1 tablet by mouth daily., Disp: , Rfl:    naproxen sodium (ALEVE) 220 MG tablet, Take 220 mg by mouth 2 (two) times daily  as needed (pain)., Disp: , Rfl:    nitroGLYCERIN (NITROSTAT) 0.4 MG SL tablet, Place 0.4 mg under the tongue every 5 (five) minutes as needed for chest pain., Disp: , Rfl:    pravastatin (PRAVACHOL) 20 MG tablet, Take 20 mg by mouth every evening., Disp: , Rfl:    No Known Allergies  Past Medical History:  Diagnosis Date   Arthritis    Asthma    Bradycardia    2/2 beta blockers   CAD (coronary artery disease)    myoview 3/13: mild ant ischemia;  LHC 4/13:  LAD < 10%, pRCA 30%, EF 55-65%   Chronic back pain    Chronic headaches    Chronic left hip pain    Chronic pain of left elbow    Depression    Diabetes mellitus    GERD (gastroesophageal reflux disease)    Glaucoma    Hyperlipemia    Hypertension    Sleep apnea      Past Surgical History:  Procedure Laterality Date   ABDOMINAL HYSTERECTOMY     APPENDECTOMY     CARDIAC CATHETERIZATION  2013   minimal plaque    Family History  Problem Relation Age of Onset   Colon cancer Brother    Colon cancer Paternal Aunt    Stomach cancer Paternal Grandmother    Heart disease Father    Heart  disease Brother        x3   Esophageal cancer Neg Hx    Rectal cancer Neg Hx     Social History   Tobacco Use   Smoking status: Former   Smokeless tobacco: Never  Building services engineer Use: Never used  Substance Use Topics   Alcohol use: No   Drug use: No    ROS   Objective:   Vitals: BP (!) 162/81 (BP Location: Left Arm)   Pulse 84   Temp 98.4 F (36.9 C) (Oral)   Resp 18   SpO2 95%   Physical Exam Constitutional:      General: She is not in acute distress.    Appearance: Normal appearance. She is well-developed. She is obese. She is not ill-appearing, toxic-appearing or diaphoretic.  HENT:     Head: Normocephalic and atraumatic.     Right Ear: External ear normal.     Left Ear: External ear normal.     Nose: Congestion and rhinorrhea present.     Comments: Bilateral sinus tenderness over the maxillary region.     Mouth/Throat:     Mouth: Mucous membranes are moist.  Eyes:     General: No scleral icterus.       Right eye: No discharge.        Left eye: No discharge.     Extraocular Movements: Extraocular movements intact.     Conjunctiva/sclera: Conjunctivae normal.     Pupils: Pupils are equal, round, and reactive to light.  Cardiovascular:     Rate and Rhythm: Normal rate and regular rhythm.     Pulses: Normal pulses.     Heart sounds: Normal heart sounds. No murmur heard.   No friction rub. No gallop.  Pulmonary:     Effort: Pulmonary effort is normal. No respiratory distress.     Breath sounds: No stridor. Wheezing (Mild mid-lower lung fields bilaterally) present. No rhonchi or rales.  Skin:    General: Skin is warm and dry.     Findings: No rash.  Neurological:     Mental Status: She is alert and oriented to person, place, and time.  Psychiatric:        Mood and Affect: Mood normal.        Behavior: Behavior normal.        Thought Content: Thought content normal.    Assessment and Plan :   PDMP not reviewed this encounter.  1. Acute non-recurrent sinusitis of other sinus   2. Type 2 diabetes mellitus treated without insulin (HCC)   3. Mild persistent asthma without complication   4. Wheezing     Will start empiric treatment for sinusitis with amoxicillin.  In light of her asthma, refilled her albuterol and recommended an oral prednisone course due to wheezing on exam.  No rales or crackles, pulse oximetry and temperature normal, therefore deferred imaging.  Recommended supportive care otherwise including the use of oral antihistamine. Counseled patient on potential for adverse effects with medications prescribed/recommended today, ER and return-to-clinic precautions discussed, patient verbalized understanding.    Wallis Bamberg, PA-C 10/26/20 1218

## 2020-10-27 ENCOUNTER — Ambulatory Visit: Payer: Self-pay

## 2021-03-28 ENCOUNTER — Other Ambulatory Visit: Payer: Self-pay

## 2021-03-28 ENCOUNTER — Encounter (HOSPITAL_COMMUNITY): Payer: Self-pay | Admitting: Emergency Medicine

## 2021-03-28 ENCOUNTER — Emergency Department (HOSPITAL_COMMUNITY)
Admission: EM | Admit: 2021-03-28 | Discharge: 2021-03-28 | Disposition: A | Discharge status: Home / Self Care | Payer: Self-pay | Attending: Emergency Medicine | Admitting: Emergency Medicine

## 2021-03-28 DIAGNOSIS — R55 Syncope and collapse: Secondary | ICD-10-CM | POA: Insufficient documentation

## 2021-03-28 DIAGNOSIS — Z79899 Other long term (current) drug therapy: Secondary | ICD-10-CM | POA: Insufficient documentation

## 2021-03-28 DIAGNOSIS — I2510 Atherosclerotic heart disease of native coronary artery without angina pectoris: Secondary | ICD-10-CM | POA: Insufficient documentation

## 2021-03-28 DIAGNOSIS — I10 Essential (primary) hypertension: Secondary | ICD-10-CM | POA: Insufficient documentation

## 2021-03-28 DIAGNOSIS — Z7982 Long term (current) use of aspirin: Secondary | ICD-10-CM | POA: Insufficient documentation

## 2021-03-28 DIAGNOSIS — E119 Type 2 diabetes mellitus without complications: Secondary | ICD-10-CM | POA: Insufficient documentation

## 2021-03-28 DIAGNOSIS — Z7984 Long term (current) use of oral hypoglycemic drugs: Secondary | ICD-10-CM | POA: Insufficient documentation

## 2021-03-28 LAB — CBC WITH DIFFERENTIAL/PLATELET
Abs Immature Granulocytes: 0.02 10*3/uL (ref 0.00–0.07)
Basophils Absolute: 0 10*3/uL (ref 0.0–0.1)
Basophils Relative: 1 %
Eosinophils Absolute: 0.1 10*3/uL (ref 0.0–0.5)
Eosinophils Relative: 2 %
HCT: 38.9 % (ref 36.0–46.0)
Hemoglobin: 12.6 g/dL (ref 12.0–15.0)
Immature Granulocytes: 0 %
Lymphocytes Relative: 33 %
Lymphs Abs: 1.6 10*3/uL (ref 0.7–4.0)
MCH: 28.6 pg (ref 26.0–34.0)
MCHC: 32.4 g/dL (ref 30.0–36.0)
MCV: 88.4 fL (ref 80.0–100.0)
Monocytes Absolute: 0.3 10*3/uL (ref 0.1–1.0)
Monocytes Relative: 6 %
Neutro Abs: 2.8 10*3/uL (ref 1.7–7.7)
Neutrophils Relative %: 58 %
Platelets: 358 10*3/uL (ref 150–400)
RBC: 4.4 MIL/uL (ref 3.87–5.11)
RDW: 16.2 % — ABNORMAL HIGH (ref 11.5–15.5)
WBC: 4.8 10*3/uL (ref 4.0–10.5)
nRBC: 0 % (ref 0.0–0.2)

## 2021-03-28 LAB — COMPREHENSIVE METABOLIC PANEL
ALT: 19 U/L (ref 0–44)
AST: 22 U/L (ref 15–41)
Albumin: 3.6 g/dL (ref 3.5–5.0)
Alkaline Phosphatase: 46 U/L (ref 38–126)
Anion gap: 9 (ref 5–15)
BUN: 10 mg/dL (ref 8–23)
CO2: 23 mmol/L (ref 22–32)
Calcium: 9.2 mg/dL (ref 8.9–10.3)
Chloride: 106 mmol/L (ref 98–111)
Creatinine, Ser: 0.55 mg/dL (ref 0.44–1.00)
GFR, Estimated: >60 mL/min (ref >60)
Glucose, Bld: 105 mg/dL — ABNORMAL HIGH (ref 70–99)
Potassium: 3.4 mmol/L — ABNORMAL LOW (ref 3.5–5.1)
Sodium: 138 mmol/L (ref 135–145)
Total Bilirubin: 0.4 mg/dL (ref 0.3–1.2)
Total Protein: 6.8 g/dL (ref 6.5–8.1)

## 2021-03-28 LAB — CBG MONITORING, ED: Glucose-Capillary: 103 mg/dL — ABNORMAL HIGH (ref 70–99)

## 2021-03-28 NOTE — ED Provider Notes (Addendum)
[facility] EMERGENCY DEPARTMENT Provider Note   CSN: [id number] Arrival date & time: 03/28/21  6629     History  Chief Complaint  Patient presents with   Loss of Consciousness    Tammy [name] is a 66 y.o. female.   Loss of Consciousness Associated symptoms: no chest pain, no dizziness, no fever, no headaches, no shortness of breath and no vomiting    Patient is a 66 year old female with past medical history significant for DM2, obesity, depression, sleep apnea, bradycardia, HTN, CAD, fatigue, palpitations, CAD  She is presented to the emergency room today with complaints of single episode of syncope.  Per patient's daughter this episode occurred after a family member passed away and this news was spoken to our patient. Upon finding out this information patient became nauseous, lightheaded and passed out.  Family members, the patient she has not struck her head.  She seems somewhat out of it for a few moments but was able answer questions appropriately and was without any seizure-like activity.  She has not had any nausea vomiting or confusion slurred speech numbness or weakness.  Denies any chest pain difficulty breathing and states that she feels well currently has no symptoms.     Home Medications Prior to Admission medications   Medication Sig Start Date End Date Taking? Authorizing Provider  albuterol (VENTOLIN HFA) 108 (90 Base) MCG/ACT inhaler Inhale 1-2 puffs into the lungs every 6 (six) hours as needed for wheezing or shortness of breath. 10/26/20   Wallis Bamberg, PA-C  amLODipine (NORVASC) 10 MG tablet Take 10 mg by mouth daily.    [provider]  amoxicillin (AMOXIL) 875 MG tablet Take 1 tablet (875 mg total) by mouth 2 (two) times daily. 10/26/20   Wallis Bamberg, PA-C  aspirin EC 81 MG tablet Take 81 mg by mouth every morning.     [provider]  bimatoprost (LUMIGAN) 0.03 % ophthalmic solution Place 1 drop into both eyes at bedtime.      [provider]  cetirizine (ZYRTEC ALLERGY) 10 MG tablet Take 1 tablet (10 mg total) by mouth daily. 10/26/20   Wallis Bamberg, PA-C  cloNIDine (CATAPRES) 0.2 MG tablet Take 0.2 mg by mouth 3 (three) times daily.     [provider]  famotidine (PEPCID) 20 MG tablet Take 20 mg by mouth daily.    [provider]  hydrALAZINE (APRESOLINE) 50 MG tablet Take 50 mg by mouth 4 (four) times daily. 10/08/20   [provider]  HYDROcodone-acetaminophen (NORCO/VICODIN) 5-325 MG tablet Take 1 tablet by mouth every 4 (four) hours as needed for moderate pain. Patient not taking: Reported on 01/22/2017 11/23/16   Azalia Bilis, MD  indomethacin (INDOCIN) 25 MG capsule Take 25 mg by mouth 2 (two) times daily with a meal.    [provider]  lisinopril (ZESTRIL) 20 MG tablet Take 20 mg by mouth daily.    [provider]  metFORMIN (GLUCOPHAGE) 500 MG tablet Take 500 mg by mouth daily.     [provider]  Multiple Vitamin (MULTIVITAMIN WITH MINERALS) TABS tablet Take 1 tablet by mouth daily.    [provider]  naproxen sodium (ALEVE) 220 MG tablet Take 220 mg by mouth 2 (two) times daily as needed (pain).    [provider]  nitroGLYCERIN (NITROSTAT) 0.4 MG SL tablet Place 0.4 mg under the tongue every 5 (five) minutes as needed for chest pain. 06/17/11 03/07/17  Tereso Newcomer T, PA-C  pravastatin (PRAVACHOL) 20 MG tablet Take 20 mg by mouth every evening. 07/13/11 03/07/17  Tereso Newcomer T, PA-C  predniSONE (DELTASONE) 20 MG tablet Take 2 tablets daily with breakfast. 10/26/20   Wallis Bamberg, PA-C      Allergies    Patient has no known allergies.    Review of Systems   Review of Systems  Constitutional:  Negative for chills, fatigue and fever.  HENT:  Negative for congestion.   Eyes:  Negative for pain.  Respiratory:  Negative for cough and shortness of breath.   Cardiovascular:  Positive for syncope. Negative for chest pain and leg  swelling.  Gastrointestinal:  Negative for abdominal pain and vomiting.  Genitourinary:  Negative for dysuria.  Musculoskeletal:  Negative for myalgias.  Skin:  Negative for rash.  Neurological:  Positive for syncope. Negative for dizziness and headaches.   Physical Exam Updated Vital Signs BP (!) 173/81    Pulse 60    Temp 98.2 F (36.8 C) (Oral)    Resp 16    SpO2 99%  Physical Exam Vitals and nursing note reviewed.  Constitutional:      General: She is not in acute distress. HENT:     Head: Normocephalic and atraumatic.     Nose: Nose normal.  Eyes:     General: No scleral icterus. Cardiovascular:     Rate and Rhythm: Normal rate and regular rhythm.     Pulses: Normal pulses.     Heart sounds: Normal heart sounds.  Pulmonary:     Effort: Pulmonary effort is normal. No respiratory distress.     Breath sounds: No wheezing.     Comments: No tachypnea, lungs clear to auscultation Abdominal:     Palpations: Abdomen is soft.     Tenderness: There is no abdominal tenderness.  Musculoskeletal:     Cervical back: Normal range of motion.     Right lower leg: No edema.     Left lower leg: No edema.  Skin:    General: Skin is warm and dry.     Capillary Refill: Capillary refill takes less than 2 seconds.  Neurological:     Mental Status: She is alert. Mental status is at baseline.     Comments: Alert and oriented to self, place, time and event.   Speech is fluent, clear without dysarthria or dysphasia.   Strength 5/5 in upper/lower extremities   Sensation intact in upper/lower extremities   CN I not tested  CN II grossly intact visual fields bilaterally. Did not visualize posterior eye.  CN III, IV, VI PERRLA and EOMs intact bilaterally  CN V Intact sensation to sharp and light touch to the face  CN VII facial movements symmetric  CN VIII not tested  CN IX, X no uvula deviation, symmetric rise of soft palate  CN XI 5/5 SCM and trapezius strength bilaterally  CN XII  Midline tongue protrusion, symmetric L/R movements   Psychiatric:        Mood and Affect: Mood normal.        Behavior: Behavior normal.    ED Results / Procedures / Treatments   Labs (all labs ordered are listed, but only abnormal results are displayed) Labs Reviewed  COMPREHENSIVE METABOLIC PANEL - Abnormal; Notable for the following components:      Result Value   Potassium 3.4 (*)    Glucose, Bld 105 (*)    All other components within normal limits  CBC WITH DIFFERENTIAL/PLATELET - Abnormal; Notable for the  following components:   RDW 16.2 (*)    All other components within normal limits  CBG MONITORING, ED - Abnormal; Notable for the following components:   Glucose-Capillary 103 (*)    All other components within normal limits    EKG None  Radiology No results found.  Procedures Procedures    Medications Ordered in ED Medications - No data to display  ED Course/ Medical Decision Making/ A&P                           Medical Decision Making  I evaluated this patient in a triage room at the request of family as they are planning to leave without being seen.  Patient is a 67 year old female notably does have CAD and an episode of syncope today.  She was caught and did not strike her head.  She denies any symptoms currently she has been in the ER for 4 hours.  She is neurologically intact on exam well-appearing and has clear lungs and is alert and oriented  I did obtain EKG which is normal sinus rhythm no significant axis deviations and no evidence of ischemia.  Her vital signs are within normal limits apart from mild hypertension.  I recommended that she stay for a troponin and CT scan of head.  We had a very lengthy discussion about the importance of these tests and the fact that he has not obtained but there are diseases on the differential diagnosis that could cause morbidity or mortality.  She understands that she could die as result of not having a full  work-up and states she would still prefer to go home.  Given that patient is fully capable of decisions and her daughter states that she is at her mental baseline I recommend very close follow-up with PCP and cardiology.  For any new or concerning symptoms please return to the emergency room.  They understand that I believe that she should stay in the ER for complete work-up however I will defer to their wishes.  Ultimately however--as patient points out--this episode is very in keeping with situational syncope due to emotional stress of finding out that a level had died.  Given reassuring work-up uncomfortable discharge home at this time with understanding that they will return for any new or concerning symptoms.  Final Clinical Impression(s) / ED Diagnoses Final diagnoses:  Syncope and collapse    Rx / DC Orders ED Discharge Orders     None         Gailen Shelter, [place] 03/28/21 1018    Solon Augusta [place], [place] 03/28/21 1019    Tegeler, Canary Brim, MD 03/28/21 1249

## 2021-03-28 NOTE — Discharge Instructions (Signed)
Please follow-up with your primary care provider as soon as you are possibly able to.  Please also follow-up with your cardiologist.  As we discussed I think it is vitally important for [place] to do additional work-up.  Emergency room for your arm however since you have been here for 4 hours and you like to be discharged home with the understanding that you have declined a CT scan of your head and an additional lab to check your heart enzymes and that without this data we very well may be missing vital information.  Obviously you may return to the emergency room for any new or concerning symptoms. My name is Stevphen Meuse I am a PA (if you come back before 4pm tell the front desk to inform me you're here).

## 2021-03-28 NOTE — ED Triage Notes (Signed)
Patient was upstairs, family member passed away and patient had a syncopal episode.  She denies any chest pain or shortness of breath. No nausea or vomiting.  She states that she just feels a little "washed out".

## 2021-03-28 NOTE — ED Provider Triage Note (Signed)
Emergency Medicine Provider Triage Evaluation Note  Tammy [name] , a 66 y.o. female  was evaluated in triage.  Pt complains of syncopal episode.  Patient's daughter reports that her mother was coming to the hospital to visit family member who had just passed away.  Patient had a single episode while in the waiting room.  Answering questions with yes or no head shakes.  Denies any pain or discomfort.  Review of Systems  Positive: Syncope Negative: Chest pain, shortness of breath, neck pain, back pain  Physical Exam  BP (!) 159/89    Pulse 96    Temp 99 F (37.2 C) (Oral)    Resp 16    SpO2 97%  Gen:   Awake, no distress   Resp:  Normal effort  MSK:   Moves extremities without difficulty  Other:  S1, S2 present with no murmurs rubs or gallops.  +2 radial pulse bilaterally.  Pupils PERRL.  Head is atraumatic  Medical Decision Making  Medically screening exam initiated at 6:28 AM.  Appropriate orders placed.  [place] was informed that the remainder of the evaluation will be completed by another provider, this initial triage assessment does not replace that evaluation, and the importance of remaining in the ED until their evaluation is complete.     Haskel Schroeder, [place] 03/28/21 [phone]

## 2022-03-29 ENCOUNTER — Ambulatory Visit
Admission: EM | Admit: 2022-03-29 | Discharge: 2022-03-29 | Disposition: A | Discharge status: Home / Self Care | Payer: Self-pay | Attending: Nurse Practitioner | Admitting: Nurse Practitioner

## 2022-03-29 DIAGNOSIS — J209 Acute bronchitis, unspecified: Secondary | ICD-10-CM

## 2022-03-29 DIAGNOSIS — J4521 Mild intermittent asthma with (acute) exacerbation: Secondary | ICD-10-CM

## 2022-03-29 MED ORDER — BENZONATATE 200 MG PO CAPS: 200.0000 mg | ORAL_CAPSULE | Freq: Three times a day (TID) | ORAL | 0 refills | Status: AC | PRN

## 2022-03-29 MED ORDER — ALBUTEROL SULFATE HFA 108 (90 BASE) MCG/ACT IN AERS: 1.0000 | INHALATION_SPRAY | Freq: Four times a day (QID) | RESPIRATORY_TRACT | 0 refills | Status: DC | PRN

## 2022-03-29 MED ORDER — AMOXICILLIN-POT CLAVULANATE 875-125 MG PO TABS: 1.0000 | ORAL_TABLET | Freq: Two times a day (BID) | ORAL | 0 refills | Status: AC

## 2022-03-29 MED ORDER — PREDNISONE 20 MG PO TABS: 40.0000 mg | ORAL_TABLET | Freq: Every day | ORAL | 0 refills | Status: AC

## 2022-03-29 NOTE — Discharge Instructions (Signed)
Augmentin twice daily for 7 days Tessalon as needed for cough Albuterol as needed for wheezing Prednisone daily for 5 days Rest and fluids Follow-up with your PCP 2 to 3 days for recheck Please go to the ER for any worsening symptoms

## 2022-03-29 NOTE — ED Provider Notes (Signed)
[facility]    CSN: [id number] Arrival date & time: 03/29/22  1212      History   Chief Complaint Chief Complaint  Patient presents with   Cough    HPI Tammy [name] is a 67 y.o. female who presents for evaluation of URI symptoms for 7-8 days. Patient reports associated symptoms of productive cough, congestion, wheezing. Denies N/V/D, fevers, sore throat, ear pain, body aches, shortness of breath. Patient does not have a hx of asthma.  is a previous smoker.  No known sick contacts and no recent travel. Pt is vaccinated for COVID. Pt is vaccinated for flu this season. Pt has taken the next OTC for symptoms. Pt has no other concerns at this time.    Cough Associated symptoms: wheezing     Past Medical History:  Diagnosis Date   Arthritis    Asthma    Bradycardia    2/2 beta blockers   CAD (coronary artery disease)    myoview 3/13: mild ant ischemia;  LHC 4/13:  LAD < 10%, pRCA 30%, EF 55-65%   Chronic back pain    Chronic headaches    Chronic left hip pain    Chronic pain of left elbow    Depression    Diabetes mellitus    GERD (gastroesophageal reflux disease)    Glaucoma    Hyperlipemia    Hypertension    Sleep apnea     Patient Active Problem List   Diagnosis Date Noted   Nausea alone 09/28/2011   Dysphagia, unspecified(787.20) 06/24/2011   Family history of malignant neoplasm of gastrointestinal tract 06/24/2011   Left hip pain 05/09/2011   Left elbow pain 05/09/2011   Altered mental state 05/09/2011   SHOULDER PAIN, RIGHT 06/05/2010   LOCALIZED SUPERFICIAL SWELLING MASS OR LUMP 05/14/2010   HEADACHE 04/24/2010   CERVICAL STRAIN 04/24/2010   OBESITY 10/16/2009   SCIATICA, RIGHT 09/25/2009   GERD 07/22/2009   VARICOSE VEINS, LOWER EXTREMITIES 05/07/2009   SYNCOPE 12/20/2008   VAGINITIS 07/25/2008   FATIGUE 05/23/2008   PALPITATIONS 05/23/2008   BRADYCARDIA 05/22/2008   GANGLION CYST, WRIST, RIGHT 12/13/2007   ABDOMINAL PAIN,  GENERALIZED 12/13/2007   ARTHRALGIA 07/25/2007   OTITIS MEDIA, ACUTE, RIGHT 05/18/2007   DIABETES MELLITUS, TYPE II 01/18/2007   DEPRESSION 01/18/2007   OBSTRUCTIVE SLEEP APNEA 01/18/2007   ASTHMA NOS W/O STATUS ASTHMATICUS 01/18/2007   HYPERTENSION 12/03/2006   LICHEN PLANUS 06/02/2004    Past Surgical History:  Procedure Laterality Date   ABDOMINAL HYSTERECTOMY     APPENDECTOMY     CARDIAC CATHETERIZATION  2013   minimal plaque    OB History   No obstetric history on file.      Home Medications    Prior to Admission medications   Medication Sig Start Date End Date Taking? Authorizing Provider  albuterol (VENTOLIN HFA) 108 (90 Base) MCG/ACT inhaler Inhale 1-2 puffs into the lungs every 6 (six) hours as needed for wheezing or shortness of breath. 03/29/22  Yes Radford Pax, NP  amoxicillin-clavulanate (AUGMENTIN) 875-125 MG tablet Take 1 tablet by mouth every 12 (twelve) hours. 03/29/22  Yes Radford Pax, NP  benzonatate (TESSALON) 200 MG capsule Take 1 capsule (200 mg total) by mouth 3 (three) times daily as needed for cough. 03/29/22  Yes Radford Pax, NP  predniSONE (DELTASONE) 20 MG tablet Take 2 tablets (40 mg total) by mouth daily with breakfast for 5 days. 03/29/22 04/03/22 Yes Radford Pax, NP  amLODipine (NORVASC) 10 MG tablet Take 10 mg by mouth daily.    [provider]  aspirin EC 81 MG tablet Take 81 mg by mouth every morning.     [provider]  bimatoprost (LUMIGAN) 0.03 % ophthalmic solution Place 1 drop into both eyes at bedtime.     [provider]  cetirizine (ZYRTEC ALLERGY) 10 MG tablet Take 1 tablet (10 mg total) by mouth daily. 10/26/20   Wallis Bamberg, PA-C  cloNIDine (CATAPRES) 0.2 MG tablet Take 0.2 mg by mouth 3 (three) times daily.     [provider]  famotidine (PEPCID) 20 MG tablet Take 20 mg by mouth daily.    [provider]  hydrALAZINE (APRESOLINE) 50 MG tablet Take 50 mg by mouth 4 (four) times daily.  10/08/20   [provider]  HYDROcodone-acetaminophen (NORCO/VICODIN) 5-325 MG tablet Take 1 tablet by mouth every 4 (four) hours as needed for moderate pain. Patient not taking: Reported on 01/22/2017 11/23/16   Azalia Bilis, MD  indomethacin (INDOCIN) 25 MG capsule Take 25 mg by mouth 2 (two) times daily with a meal.    [provider]  lisinopril (ZESTRIL) 20 MG tablet Take 20 mg by mouth daily.    [provider]  metFORMIN (GLUCOPHAGE) 500 MG tablet Take 500 mg by mouth daily.     [provider]  Multiple Vitamin (MULTIVITAMIN WITH MINERALS) TABS tablet Take 1 tablet by mouth daily.    [provider]  naproxen sodium (ALEVE) 220 MG tablet Take 220 mg by mouth 2 (two) times daily as needed (pain).    [provider]  nitroGLYCERIN (NITROSTAT) 0.4 MG SL tablet Place 0.4 mg under the tongue every 5 (five) minutes as needed for chest pain. 06/17/11 03/07/17  Tereso Newcomer T, PA-C  pravastatin (PRAVACHOL) 20 MG tablet Take 20 mg by mouth every evening. 07/13/11 03/07/17  Beatrice Lecher, PA-C    Family History Family History  Problem Relation Age of Onset   Colon cancer Brother    Colon cancer Paternal Aunt    Stomach cancer Paternal Grandmother    Heart disease Father    Heart disease Brother        x3   Esophageal cancer Neg Hx    Rectal cancer Neg Hx     Social History Social History   Tobacco Use   Smoking status: Former   Smokeless tobacco: Never  Building services engineer Use: Never used  Substance Use Topics   Alcohol use: No   Drug use: No     Allergies   Patient has no known allergies.   Review of Systems Review of Systems  HENT:  Positive for congestion.   Respiratory:  Positive for cough and wheezing.      Physical Exam Triage Vital Signs ED Triage Vitals  Enc Vitals Group     BP 03/29/22 1245 (!) 150/81     Pulse Rate 03/29/22 1245 82     Resp 03/29/22 1245 17     Temp 03/29/22 1245 98.1 F (36.7 C)      Temp Source 03/29/22 1245 Oral     SpO2 03/29/22 1245 94 %     Weight --      Height --      Head Circumference --      Peak Flow --      Pain Score 03/29/22 1246 1     Pain Loc --      Pain  Edu? --      Excl. in [place]? --    No data found.  Updated Vital Signs BP (!) 150/81 (BP Location: Right Arm)   Pulse 82   Temp 98.1 F (36.7 C) (Oral)   Resp 17   SpO2 94%   Visual Acuity Right Eye Distance:   Left Eye Distance:   Bilateral Distance:    Right Eye Near:   Left Eye Near:    Bilateral Near:     Physical Exam Vitals and nursing note reviewed.  Constitutional:      General: She is not in acute distress.    Appearance: She is well-developed. She is not ill-appearing.  HENT:     Head: Normocephalic and atraumatic.     Right Ear: Tympanic membrane and ear canal normal.     Left Ear: Tympanic membrane and ear canal normal.     Nose: Congestion present.     Mouth/Throat:     Mouth: Mucous membranes are moist.     Pharynx: Oropharynx is clear. Uvula midline. No oropharyngeal exudate or posterior oropharyngeal erythema.     Tonsils: No tonsillar exudate or tonsillar abscesses.  Eyes:     Conjunctiva/sclera: Conjunctivae normal.     Pupils: Pupils are equal, round, and reactive to light.  Cardiovascular:     Rate and Rhythm: Normal rate and regular rhythm.     Heart sounds: Normal heart sounds.  Pulmonary:     Effort: Pulmonary effort is normal.     Breath sounds: Wheezing present.  Musculoskeletal:     Cervical back: Normal range of motion and neck supple.  Lymphadenopathy:     Cervical: No cervical adenopathy.  Skin:    General: Skin is warm and dry.  Neurological:     General: No focal deficit present.     Mental Status: She is alert and oriented to person, place, and time.  Psychiatric:        Mood and Affect: Mood normal.        Behavior: Behavior normal.      UC Treatments / Results  Labs (all labs ordered are listed, but only abnormal results are  displayed) Labs Reviewed - No data to display  EKG   Radiology No results found.  Procedures Procedures (including critical care time)  Medications Ordered in UC Medications - No data to display  Initial Impression / Assessment and Plan / UC Course  I have reviewed the triage vital signs and the nursing notes.  Pertinent labs & imaging results that were available during my care of the patient were reviewed by me and considered in my medical decision making (see chart for details).     Patient in stable condition with no red flags on exam Reviewed exam and sx with patient Start Augmentin Prednisone x 5 days Albuterol inhaler and Tessalon as needed Pt verbalized understanding of instruction and all questions answered F/U with PCP in 2-3 days for re-check Strict ER precautions reviewed and patient verbalized understanding Pt was discharged in stable condition and in NAD  Final Clinical Impressions(s) / UC Diagnoses   Final diagnoses:  Mild intermittent reactive airway disease with acute exacerbation  Acute bronchitis, unspecified organism     Discharge Instructions      Augmentin twice daily for 7 days Tessalon as needed for cough Albuterol as needed for wheezing Prednisone daily for 5 days Rest and fluids Follow-up with your PCP 2 to 3 days for recheck Please go to the ER for  any worsening symptoms   ED Prescriptions     Medication Sig Dispense Auth. Provider   amoxicillin-clavulanate (AUGMENTIN) 875-125 MG tablet Take 1 tablet by mouth every 12 (twelve) hours. 14 tablet Radford Pax, NP   albuterol (VENTOLIN HFA) 108 (90 Base) MCG/ACT inhaler Inhale 1-2 puffs into the lungs every 6 (six) hours as needed for wheezing or shortness of breath. 1 each Radford Pax, NP   predniSONE (DELTASONE) 20 MG tablet Take 2 tablets (40 mg total) by mouth daily with breakfast for 5 days. 10 tablet Radford Pax, NP   benzonatate (TESSALON) 200 MG capsule Take 1 capsule (200 mg  total) by mouth 3 (three) times daily as needed for cough. 20 capsule Radford Pax, NP      PDMP not reviewed this encounter.   Radford Pax, NP 03/29/22 1304

## 2022-03-29 NOTE — ED Triage Notes (Signed)
Pt presents with productive cough and congestion X 1 week.

## 2023-11-26 ENCOUNTER — Emergency Department (HOSPITAL_COMMUNITY)
Admission: EM | Admit: 2023-11-26 | Discharge: 2023-11-27 | Disposition: A | Discharge status: Home / Self Care | Payer: Self-pay | Attending: Emergency Medicine | Admitting: Emergency Medicine

## 2023-11-26 ENCOUNTER — Emergency Department (HOSPITAL_COMMUNITY): Payer: Self-pay

## 2023-11-26 DIAGNOSIS — R1032 Left lower quadrant pain: Secondary | ICD-10-CM | POA: Insufficient documentation

## 2023-11-26 DIAGNOSIS — R109 Unspecified abdominal pain: Secondary | ICD-10-CM

## 2023-11-26 DIAGNOSIS — Z7982 Long term (current) use of aspirin: Secondary | ICD-10-CM | POA: Insufficient documentation

## 2023-11-26 LAB — URINALYSIS, ROUTINE W REFLEX MICROSCOPIC
Bilirubin Urine: NEGATIVE
Glucose, UA: NEGATIVE mg/dL
Hgb urine dipstick: NEGATIVE
Ketones, ur: NEGATIVE mg/dL
Leukocytes,Ua: NEGATIVE
Nitrite: NEGATIVE
Protein, ur: NEGATIVE mg/dL
Specific Gravity, Urine: >1.046 — ABNORMAL HIGH (ref 1.005–1.030)
pH: 5 (ref 5.0–8.0)

## 2023-11-26 LAB — COMPREHENSIVE METABOLIC PANEL WITH GFR
ALT: 16 U/L (ref 0–44)
AST: 19 U/L (ref 15–41)
Albumin: 4.2 g/dL (ref 3.5–5.0)
Alkaline Phosphatase: 67 U/L (ref 38–126)
Anion gap: 11 (ref 5–15)
BUN: 14 mg/dL (ref 8–23)
CO2: 25 mmol/L (ref 22–32)
Calcium: 10 mg/dL (ref 8.9–10.3)
Chloride: 106 mmol/L (ref 98–111)
Creatinine, Ser: 0.5 mg/dL (ref 0.44–1.00)
GFR, Estimated: >60 mL/min (ref >60)
Glucose, Bld: 86 mg/dL (ref 70–99)
Potassium: 3.9 mmol/L (ref 3.5–5.1)
Sodium: 142 mmol/L (ref 135–145)
Total Bilirubin: 0.3 mg/dL (ref 0.0–1.2)
Total Protein: 7.4 g/dL (ref 6.5–8.1)

## 2023-11-26 LAB — CBC
HCT: 43.4 % (ref 36.0–46.0)
Hemoglobin: 13.4 g/dL (ref 12.0–15.0)
MCH: 27.4 pg (ref 26.0–34.0)
MCHC: 30.9 g/dL (ref 30.0–36.0)
MCV: 88.8 fL (ref 80.0–100.0)
Platelets: 355 K/uL (ref 150–400)
RBC: 4.89 MIL/uL (ref 3.87–5.11)
RDW: 16.2 % — ABNORMAL HIGH (ref 11.5–15.5)
WBC: 5 K/uL (ref 4.0–10.5)
nRBC: 0 % (ref 0.0–0.2)

## 2023-11-26 LAB — LIPASE, BLOOD: Lipase: 22 U/L (ref 11–51)

## 2023-11-26 MED ORDER — SODIUM CHLORIDE 0.9 % IV SOLN: INTRAVENOUS | Status: DC

## 2023-11-26 MED ORDER — ONDANSETRON HCL 4 MG/2ML IJ SOLN
4.0000 mg | Freq: Once | INTRAMUSCULAR | Status: AC
  Administered 2023-11-26: 4 mg via INTRAVENOUS
  Filled 2023-11-26: qty 2

## 2023-11-26 MED ORDER — OXYCODONE-ACETAMINOPHEN 5-325 MG PO TABS: 1.0000 | ORAL_TABLET | Freq: Four times a day (QID) | ORAL | 0 refills | Status: AC | PRN

## 2023-11-26 MED ORDER — IOHEXOL 300 MG/ML  SOLN
100.0000 mL | Freq: Once | INTRAMUSCULAR | Status: AC | PRN
  Administered 2023-11-26: 100 mL via INTRAVENOUS

## 2023-11-26 MED ORDER — HYDROMORPHONE HCL 1 MG/ML IJ SOLN
0.5000 mg | Freq: Once | INTRAMUSCULAR | Status: AC
  Administered 2023-11-26: 0.5 mg via INTRAVENOUS
  Filled 2023-11-26: qty 1

## 2023-11-26 MED ORDER — KETOROLAC TROMETHAMINE 30 MG/ML IJ SOLN
15.0000 mg | Freq: Once | INTRAMUSCULAR | Status: AC
  Administered 2023-11-26: 15 mg via INTRAVENOUS
  Filled 2023-11-26: qty 1

## 2023-11-26 MED ORDER — METHOCARBAMOL 500 MG PO TABS: 500.0000 mg | ORAL_TABLET | Freq: Two times a day (BID) | ORAL | 0 refills | Status: DC

## 2023-11-26 NOTE — ED Triage Notes (Signed)
 Patient in today reporting ongoing abd pain with nausea and constipation of months. Reports being seen by gastro prescribed antibiotic for gut infection with no relief. Hx of constipation with use to laxatives and enemas.

## 2023-11-26 NOTE — ED Provider Notes (Signed)
 [place] EMERGENCY DEPARTMENT AT [facility] Provider Note   CSN: [id number] Arrival date & time: 11/26/23  1713     Patient presents with: Abdominal Pain and Constipation   Tammy [name] is a 68 y.o. female.   103 84-year-old female presents with left lower quadrant abdominal pain times several days.  Seen by her doctor and diagnosed with diverticulitis.  This was done clinically.  States that she continues to have constant sharp pain in her left lower quadrant which is nonradiating.  Notes decreased flatus.  Denies any blood in her stool.  No fever or chills.  Denies any urinary symptoms.  Has been taking Augmentin  without any relief       Prior to Admission medications   Medication Sig Start Date End Date Taking? Authorizing Provider  albuterol  (VENTOLIN  HFA) 108 (90 Base) MCG/ACT inhaler Inhale 1-2 puffs into the lungs every 6 (six) hours as needed for wheezing or shortness of breath. 03/29/22   Mayer, Jodi R, NP  amLODipine (NORVASC) 10 MG tablet Take 10 mg by mouth daily.    [provider]  amoxicillin -clavulanate (AUGMENTIN ) 875-125 MG tablet Take 1 tablet by mouth every 12 (twelve) hours. 03/29/22   Mayer, Jodi R, NP  aspirin  EC 81 MG tablet Take 81 mg by mouth every morning.     [provider]  benzonatate  (TESSALON ) 200 MG capsule Take 1 capsule (200 mg total) by mouth 3 (three) times daily as needed for cough. 03/29/22   Mayer, Jodi R, NP  bimatoprost (LUMIGAN) 0.03 % ophthalmic solution Place 1 drop into both eyes at bedtime.     [provider]  cetirizine  (ZYRTEC  ALLERGY) 10 MG tablet Take 1 tablet (10 mg total) by mouth daily. 10/26/20   Christopher Savannah, PA-C  cloNIDine  (CATAPRES ) 0.2 MG tablet Take 0.2 mg by mouth 3 (three) times daily.     [provider]  famotidine  (PEPCID ) 20 MG tablet Take 20 mg by mouth daily.    [provider]  hydrALAZINE (APRESOLINE) 50 MG tablet Take 50 mg by mouth 4 (four) times daily. 10/08/20    [provider]  HYDROcodone -acetaminophen  (NORCO/VICODIN) 5-325 MG tablet Take 1 tablet by mouth every 4 (four) hours as needed for moderate pain. Patient not taking: Reported on 01/22/2017 11/23/16   Baxter Drivers, MD  indomethacin (INDOCIN) 25 MG capsule Take 25 mg by mouth 2 (two) times daily with a meal.    [provider]  lisinopril  (ZESTRIL ) 20 MG tablet Take 20 mg by mouth daily.    [provider]  metFORMIN (GLUCOPHAGE) 500 MG tablet Take 500 mg by mouth daily.     [provider]  Multiple Vitamin (MULTIVITAMIN WITH MINERALS) TABS tablet Take 1 tablet by mouth daily.    [provider]  naproxen  sodium (ALEVE ) 220 MG tablet Take 220 mg by mouth 2 (two) times daily as needed (pain).    [provider]  nitroGLYCERIN  (NITROSTAT ) 0.4 MG SL tablet Place 0.4 mg under the tongue every 5 (five) minutes as needed for chest pain. 06/17/11 03/07/17  Lelon Hamilton T, PA-C  pravastatin  (PRAVACHOL ) 20 MG tablet Take 20 mg by mouth every evening. 07/13/11 03/07/17  Lelon Hamilton T, PA-C    Allergies: Patient has no known allergies.    Review of Systems  All other systems reviewed and are negative.   Updated Vital Signs BP (!) 171/94   Pulse 77   Temp 98.4 F (36.9 C) (Oral)  Resp 16   SpO2 94%   Physical Exam Vitals and nursing note reviewed.  Constitutional:      General: She is not in acute distress.    Appearance: Normal appearance. She is well-developed. She is not toxic-appearing.  HENT:     Head: Normocephalic and atraumatic.  Eyes:     General: Lids are normal.     Conjunctiva/sclera: Conjunctivae normal.     Pupils: Pupils are equal, round, and reactive to light.  Neck:     Thyroid: No thyroid mass.     Trachea: No tracheal deviation.  Cardiovascular:     Rate and Rhythm: Normal rate and regular rhythm.     Heart sounds: Normal heart sounds. No murmur heard.    No gallop.  Pulmonary:     Effort: Pulmonary effort is  normal. No respiratory distress.     Breath sounds: Normal breath sounds. No stridor. No decreased breath sounds, wheezing, rhonchi or rales.  Abdominal:     General: There is no distension.     Palpations: Abdomen is soft.     Tenderness: There is no abdominal tenderness. There is guarding. There is no rebound.   Musculoskeletal:        General: No tenderness. Normal range of motion.     Cervical back: Normal range of motion and neck supple.  Skin:    General: Skin is warm and dry.     Findings: No abrasion or rash.  Neurological:     Mental Status: She is alert and oriented to person, place, and time. Mental status is at baseline.     GCS: GCS eye subscore is 4. GCS verbal subscore is 5. GCS motor subscore is 6.     Cranial Nerves: No cranial nerve deficit.     Sensory: No sensory deficit.     Motor: Motor function is intact.  Psychiatric:        Attention and Perception: Attention normal.        Speech: Speech normal.        Behavior: Behavior normal.     (all labs ordered are listed, but only abnormal results are displayed) Labs Reviewed  CBC - Abnormal; Notable for the following components:      Result Value   RDW 16.2 (*)    All other components within normal limits  LIPASE, BLOOD  COMPREHENSIVE METABOLIC PANEL WITH GFR  URINALYSIS, ROUTINE W REFLEX MICROSCOPIC    EKG: None  Radiology: No results found.   Procedures   Medications Ordered in the ED  0.9 %  sodium chloride  infusion (has no administration in time range)  HYDROmorphone  (DILAUDID ) injection 0.5 mg (has no administration in time range)  ondansetron  (ZOFRAN ) injection 4 mg (has no administration in time range)                                    Medical Decision Making Amount and/or Complexity of Data Reviewed Labs: ordered. Radiology: ordered.  Risk Prescription drug management.   Urinalysis negative for infection.  Labs here are reassuring.  Abdominal CT shows no evidence of  constipation or diverticular disease.  Patient medicated and feels better.  Will discharge most relaxants and also pain medication.  Discussed findings with daughters who are at bedside.  All questions answered     Final diagnoses:  None    ED Discharge Orders     None  Dasie Faden, MD 11/26/23 2328

## 2023-11-26 NOTE — Discharge Instructions (Addendum)
Call your doctor on Monday to schedule a follow-up visit

## 2023-11-26 NOTE — ED Notes (Signed)
 Pt has been given a specimen cup for a urine sample next time she goes to the bathroom.

## 2023-11-28 ENCOUNTER — Emergency Department (HOSPITAL_BASED_OUTPATIENT_CLINIC_OR_DEPARTMENT_OTHER): Payer: Self-pay

## 2023-11-28 ENCOUNTER — Emergency Department (HOSPITAL_BASED_OUTPATIENT_CLINIC_OR_DEPARTMENT_OTHER)
Admission: EM | Admit: 2023-11-28 | Discharge: 2023-11-28 | Disposition: A | Discharge status: Home / Self Care | Payer: Self-pay | Attending: Emergency Medicine | Admitting: Emergency Medicine

## 2023-11-28 ENCOUNTER — Encounter (HOSPITAL_BASED_OUTPATIENT_CLINIC_OR_DEPARTMENT_OTHER): Payer: Self-pay

## 2023-11-28 DIAGNOSIS — Z7982 Long term (current) use of aspirin: Secondary | ICD-10-CM | POA: Insufficient documentation

## 2023-11-28 DIAGNOSIS — M25552 Pain in left hip: Secondary | ICD-10-CM | POA: Insufficient documentation

## 2023-11-28 DIAGNOSIS — N390 Urinary tract infection, site not specified: Secondary | ICD-10-CM | POA: Insufficient documentation

## 2023-11-28 LAB — CBC WITH DIFFERENTIAL/PLATELET
Abs Immature Granulocytes: 0.01 K/uL (ref 0.00–0.07)
Basophils Absolute: 0 K/uL (ref 0.0–0.1)
Basophils Relative: 1 %
Eosinophils Absolute: 0.1 K/uL (ref 0.0–0.5)
Eosinophils Relative: 2 %
HCT: 42.3 % (ref 36.0–46.0)
Hemoglobin: 13.5 g/dL (ref 12.0–15.0)
Immature Granulocytes: 0 %
Lymphocytes Relative: 38 %
Lymphs Abs: 1.9 K/uL (ref 0.7–4.0)
MCH: 27.8 pg (ref 26.0–34.0)
MCHC: 31.9 g/dL (ref 30.0–36.0)
MCV: 87 fL (ref 80.0–100.0)
Monocytes Absolute: 0.3 K/uL (ref 0.1–1.0)
Monocytes Relative: 7 %
Neutro Abs: 2.6 K/uL (ref 1.7–7.7)
Neutrophils Relative %: 52 %
Platelets: 346 K/uL (ref 150–400)
RBC: 4.86 MIL/uL (ref 3.87–5.11)
RDW: 16 % — ABNORMAL HIGH (ref 11.5–15.5)
WBC: 4.9 K/uL (ref 4.0–10.5)
nRBC: 0 % (ref 0.0–0.2)

## 2023-11-28 LAB — URINALYSIS, W/ REFLEX TO CULTURE (INFECTION SUSPECTED)
Bilirubin Urine: NEGATIVE
Glucose, UA: NEGATIVE mg/dL
Hgb urine dipstick: NEGATIVE
Ketones, ur: NEGATIVE mg/dL
Nitrite: NEGATIVE
Protein, ur: NEGATIVE mg/dL
RBC / HPF: NONE SEEN RBC/hpf (ref 0–5)
Specific Gravity, Urine: 1.025 (ref 1.005–1.030)
pH: 5.5 (ref 5.0–8.0)

## 2023-11-28 LAB — COMPREHENSIVE METABOLIC PANEL WITH GFR
ALT: 18 U/L (ref 0–44)
AST: 20 U/L (ref 15–41)
Albumin: 4 g/dL (ref 3.5–5.0)
Alkaline Phosphatase: 65 U/L (ref 38–126)
Anion gap: 11 (ref 5–15)
BUN: 14 mg/dL (ref 8–23)
CO2: 26 mmol/L (ref 22–32)
Calcium: 9.5 mg/dL (ref 8.9–10.3)
Chloride: 103 mmol/L (ref 98–111)
Creatinine, Ser: 0.52 mg/dL (ref 0.44–1.00)
GFR, Estimated: >60 mL/min (ref >60)
Glucose, Bld: 108 mg/dL — ABNORMAL HIGH (ref 70–99)
Potassium: 4.2 mmol/L (ref 3.5–5.1)
Sodium: 140 mmol/L (ref 135–145)
Total Bilirubin: 0.3 mg/dL (ref 0.0–1.2)
Total Protein: 7.1 g/dL (ref 6.5–8.1)

## 2023-11-28 MED ORDER — CIPROFLOXACIN HCL 500 MG PO TABS: 500.0000 mg | ORAL_TABLET | Freq: Two times a day (BID) | ORAL | 0 refills | Status: AC

## 2023-11-28 MED ORDER — FLUCONAZOLE 150 MG PO TABS: 150.0000 mg | ORAL_TABLET | Freq: Once | ORAL | Status: DC

## 2023-11-28 MED ORDER — IOHEXOL 300 MG/ML  SOLN
100.0000 mL | Freq: Once | INTRAMUSCULAR | Status: AC | PRN
  Administered 2023-11-28: 100 mL via INTRAVENOUS

## 2023-11-28 MED ORDER — NAPROXEN 500 MG PO TABS: 500.0000 mg | ORAL_TABLET | Freq: Two times a day (BID) | ORAL | 0 refills | Status: DC

## 2023-11-28 MED ORDER — HYDROMORPHONE HCL 1 MG/ML IJ SOLN
1.0000 mg | Freq: Once | INTRAMUSCULAR | Status: AC
  Administered 2023-11-28: 1 mg via INTRAVENOUS
  Filled 2023-11-28: qty 1

## 2023-11-28 NOTE — ED Triage Notes (Signed)
 Pt continues to have pain left buttocks radiating to left abdomen, hip and thigh . Knot to left that developed Saturday morning. No redness noted to area. Pt has been on antibiotics for potential intestinal infection. Nauseated .  Denies vomiting, diarrhea or blood in stool pt has been taking Ibuprofen, robaxin  and flexeril at bedtime. No relief with pain
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
## 2023-11-28 NOTE — ED Notes (Signed)
 Pt. Just left for CT in stretcher
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
## 2023-11-28 NOTE — ED Notes (Signed)
 RT asked to assess patient due to decreased SAT of 86%. Patient was given pain medication and was sleeping. Placed on 2L

## 2023-12-19 ENCOUNTER — Emergency Department (HOSPITAL_BASED_OUTPATIENT_CLINIC_OR_DEPARTMENT_OTHER)
Admission: EM | Admit: 2023-12-19 | Discharge: 2023-12-20 | Disposition: A | Discharge status: Home / Self Care | Payer: Self-pay | Attending: Emergency Medicine | Admitting: Emergency Medicine

## 2023-12-19 ENCOUNTER — Encounter (HOSPITAL_BASED_OUTPATIENT_CLINIC_OR_DEPARTMENT_OTHER): Payer: Self-pay

## 2023-12-19 ENCOUNTER — Emergency Department (HOSPITAL_BASED_OUTPATIENT_CLINIC_OR_DEPARTMENT_OTHER): Payer: Self-pay

## 2023-12-19 ENCOUNTER — Other Ambulatory Visit: Payer: Self-pay

## 2023-12-19 DIAGNOSIS — Z7982 Long term (current) use of aspirin: Secondary | ICD-10-CM | POA: Diagnosis not present

## 2023-12-19 DIAGNOSIS — I10 Essential (primary) hypertension: Secondary | ICD-10-CM | POA: Insufficient documentation

## 2023-12-19 DIAGNOSIS — Z79899 Other long term (current) drug therapy: Secondary | ICD-10-CM | POA: Insufficient documentation

## 2023-12-19 DIAGNOSIS — M1612 Unilateral primary osteoarthritis, left hip: Secondary | ICD-10-CM | POA: Insufficient documentation

## 2023-12-19 DIAGNOSIS — M25552 Pain in left hip: Secondary | ICD-10-CM | POA: Diagnosis present

## 2023-12-19 NOTE — ED Triage Notes (Signed)
 Pt having L hip pain since yesterday. Denies fall or injury. 10/10 pain that radiates to back that is preventing patient from walking. Was seen 3 weeks ago for same. Denies bowel or bladder incontinence.

## 2023-12-19 NOTE — ED Provider Notes (Signed)
 [place] [facility] Provider Note   CSN: [id number] Arrival date & time: 12/19/23  1914     Patient presents with: Hip Pain   Tammy [name] is a 68 y.o. female.  {Add pertinent medical, surgical, social history, OB history to HPI:32947}  Hip Pain       Prior to Admission medications   Medication Sig Start Date End Date Taking? Authorizing Provider  albuterol  (VENTOLIN  HFA) 108 (90 Base) MCG/ACT inhaler Inhale 1-2 puffs into the lungs every 6 (six) hours as needed for wheezing or shortness of breath. 03/29/22   Mayer, Jodi R, NP  amLODipine (NORVASC) 10 MG tablet Take 10 mg by mouth daily.    [provider]  amoxicillin -clavulanate (AUGMENTIN ) 875-125 MG tablet Take 1 tablet by mouth every 12 (twelve) hours. 03/29/22   Loreda Myla SAUNDERS, NP  aspirin  EC 81 MG tablet Take 81 mg by mouth every morning.     [provider]  benzonatate  (TESSALON ) 200 MG capsule Take 1 capsule (200 mg total) by mouth 3 (three) times daily as needed for cough. 03/29/22   Mayer, Jodi R, NP  bimatoprost (LUMIGAN) 0.03 % ophthalmic solution Place 1 drop into both eyes at bedtime.     [provider]  cetirizine  (ZYRTEC  ALLERGY) 10 MG tablet Take 1 tablet (10 mg total) by mouth daily. 10/26/20   Christopher Savannah, PA-C  cloNIDine  (CATAPRES ) 0.2 MG tablet Take 0.2 mg by mouth 3 (three) times daily.     [provider]  famotidine  (PEPCID ) 20 MG tablet Take 20 mg by mouth daily.    [provider]  hydrALAZINE (APRESOLINE) 50 MG tablet Take 50 mg by mouth 4 (four) times daily. 10/08/20   [provider]  HYDROcodone -acetaminophen  (NORCO/VICODIN) 5-325 MG tablet Take 1 tablet by mouth every 4 (four) hours as needed for moderate pain. Patient not taking: Reported on 01/22/2017 11/23/16   Baxter Drivers, MD  indomethacin (INDOCIN) 25 MG capsule Take 25 mg by mouth 2 (two) times daily with a meal.    [provider]  lisinopril   (ZESTRIL ) 20 MG tablet Take 20 mg by mouth daily.    [provider]  metFORMIN (GLUCOPHAGE) 500 MG tablet Take 500 mg by mouth daily.     [provider]  methocarbamol  (ROBAXIN ) 500 MG tablet Take 1 tablet (500 mg total) by mouth 2 (two) times daily. 11/26/23   Dasie Faden, MD  Multiple Vitamin (MULTIVITAMIN WITH MINERALS) TABS tablet Take 1 tablet by mouth daily.    [provider]  naproxen  (NAPROSYN ) 500 MG tablet Take 1 tablet (500 mg total) by mouth 2 (two) times daily. 11/28/23   Myriam Dorn BROCKS, PA  naproxen  sodium (ALEVE ) 220 MG tablet Take 220 mg by mouth 2 (two) times daily as needed (pain).    [provider]  nitroGLYCERIN  (NITROSTAT ) 0.4 MG SL tablet Place 0.4 mg under the tongue every 5 (five) minutes as needed for chest pain. 06/17/11 03/07/17  Lelon Hamilton T, PA-C  oxyCODONE -acetaminophen  (PERCOCET/ROXICET) 5-325 MG tablet Take 1 tablet by mouth every 6 (six) hours as needed for severe pain (pain score 7-10). 11/26/23   Dasie Faden, MD  pravastatin  (PRAVACHOL ) 20 MG tablet Take 20 mg by mouth every evening. 07/13/11 03/07/17  Lelon Hamilton T, PA-C    Allergies: Patient has no known allergies.    Review of Systems  Updated Vital Signs Ht 5' 3 (1.6 m)   Wt 108.9 kg  BMI 42.53 kg/m   Physical Exam  (all labs ordered are listed, but only abnormal results are displayed) Labs Reviewed - No data to display  EKG: None  Radiology: DG Hip Unilat With Pelvis 2-3 Views Left Result Date: 12/19/2023 CLINICAL DATA:  Acute onset left hip pain, inability to bear weight EXAM: DG HIP (WITH OR WITHOUT PELVIS) 2-3V LEFT COMPARISON:  03/07/2016, 11/28/2023 FINDINGS: Frontal view of the pelvis as well as frontal and frogleg lateral views of the left hip are obtained. No acute fracture, subluxation, or dislocation. There is moderate symmetrical bilateral hip osteoarthritis with joint space narrowing and osteophyte formation. No significant change  since recent CT. Sacroiliac joints are unremarkable. The soft tissues are normal. IMPRESSION: 1. Moderate symmetrical bilateral hip osteoarthritis. 2. No acute bony abnormality. Electronically Signed   By: Ozell Daring M.D.   On: 12/19/2023 19:53    {Document cardiac monitor, telemetry assessment procedure when appropriate:32947} Procedures   Medications Ordered in the ED - No data to display    {Click here for ABCD2, HEART and other calculators REFRESH Note before signing:1}                              Medical Decision Making Amount and/or Complexity of Data Reviewed Radiology: ordered.   ***  {Document critical care time when appropriate  Document review of labs and clinical decision tools ie CHADS2VASC2, etc  Document your independent review of radiology images and any outside records  Document your discussion with family members, caretakers and with consultants  Document social determinants of health affecting pt's care  Document your decision making why or why not admission, treatments were needed:32947:::1}   Final diagnoses:  None    ED Discharge Orders     None

## 2023-12-20 MED ORDER — OXYCODONE HCL 5 MG PO TABS
5.0000 mg | ORAL_TABLET | Freq: Once | ORAL | Status: AC
  Administered 2023-12-20: 5 mg via ORAL
  Filled 2023-12-20: qty 1

## 2023-12-20 MED ORDER — DEXAMETHASONE SODIUM PHOSPHATE 10 MG/ML IJ SOLN
10.0000 mg | Freq: Once | INTRAMUSCULAR | Status: AC
  Administered 2023-12-20: 10 mg via INTRAMUSCULAR
  Filled 2023-12-20: qty 1

## 2023-12-20 MED ORDER — ACETAMINOPHEN 500 MG PO TABS
1000.0000 mg | ORAL_TABLET | Freq: Once | ORAL | Status: AC
  Administered 2023-12-20: 1000 mg via ORAL
  Filled 2023-12-20: qty 2

## 2023-12-20 MED ORDER — PREDNISONE 10 MG PO TABS: ORAL_TABLET | ORAL | 0 refills | Status: AC

## 2023-12-20 MED ORDER — KETOROLAC TROMETHAMINE 15 MG/ML IJ SOLN
15.0000 mg | Freq: Once | INTRAMUSCULAR | Status: AC
  Administered 2023-12-20: 15 mg via INTRAMUSCULAR
  Filled 2023-12-20: qty 1

## 2023-12-20 MED ORDER — MELOXICAM 7.5 MG PO TABS: 7.5000 mg | ORAL_TABLET | Freq: Every day | ORAL | 0 refills | Status: AC

## 2023-12-20 NOTE — Discharge Instructions (Addendum)
 The CT scan of your left hip from your last visit, as well as the x-ray of your hip from this visit show osteoarthritis of your left hip.  This is the source of your left hip pain. Osteoarthritis is when the cushioning (cartilage) in between your bones wears down and can cause pain.   You need to follow-up with the orthopedic office listed below, Dr. Kendal, soon as possible for further evaluation and treatment.  You need to call to schedule this appointment.  You may take up to 1000mg  of tylenol  every 6 hours as needed for pain.  Do not take more then 4g per day.  You have been prescribed a medication called meloxicam  for pain. You may take this once a day as prescribed. Do not take any other NSAID medications like naproxen  (Aleve ), ibuprofen (Motrin), Advil, BC powders with this medication.  You have been prescribed prednisone . Please take this medication as prescribed for the next 6 days (40mg  on day 1, 30mg  on days 2 and 3, 20mg  on days 4 and 5, 10mg  on day 6). Take this medication in the morning with breakfast, as taking it at night may make it hard to sleep. If you are a diabetic, please monitor your blood sugars closely on this medication, as it can cause your blood sugar to rise.   Please return to the ER for any numbness in your leg, loss of bowel or bladder control, any other new or concerning symptoms

## 2023-12-27 ENCOUNTER — Other Ambulatory Visit: Payer: Self-pay

## 2023-12-27 ENCOUNTER — Telehealth: Admitting: Nurse Practitioner

## 2023-12-27 DIAGNOSIS — M5431 Sciatica, right side: Secondary | ICD-10-CM | POA: Diagnosis not present

## 2023-12-27 MED ORDER — NAPROXEN SODIUM 220 MG PO TABS
220.0000 mg | ORAL_TABLET | Freq: Two times a day (BID) | ORAL | 1 refills | Status: DC | PRN
  Filled 2023-12-27: qty 50, 25d supply, fill #0

## 2023-12-27 NOTE — Patient Instructions (Signed)
 Pharmacy   [phone]

## 2023-12-27 NOTE — Progress Notes (Signed)
 Acute Video Visit    Virtual Visit Consent:   Tammy [name], you are scheduled for a virtual visit with a [facility] provider today.     Just as with appointments in the office, your consent must be obtained to participate.  Your consent will be active for this visit and any virtual visit you may have with one of our providers in the next 365 days.     If you have a MyChart account, a copy of this consent can be sent to you electronically.  All virtual visits are billed to your insurance company just like a traditional visit in the office.    If the connection with a video visit is poor, the visit may have to be switched to a telephone visit.  With a telephone visit, we are not always able to ensure that we have a secure connection.     I need to obtain your verbal consent now.   Are you willing to proceed with your visit today?    Tammy [name] has provided verbal consent on 12/27/2023 for a virtual visit ( telephone).   Lauraine Kitty, FNP  Date: 12/27/2023 1:36 PM  Subjective:     Patient ID: Tammy [name], female    DOB: 10-01-1955, 67 y.o.   MRN: [id number]  Tammy [name], connected with  Tammy [name]  ([id number], September 20, 1955) on 12/27/23 at  1:00 PM EDT by telephone and verified that I am speaking with the correct person using two identifiers.   Location: Patient: Home  Provider: Virtual Visit Location Provider: Home Office   I discussed the limitations of evaluation and management by telemedicine and the availability of in person appointments. The patient expressed understanding and agreed to proceed.     HPI Tammy [name] is a 68 y.o. who identifies as a female who was assigned female at birth, and is being seen today for assistance with pain medication refills.   She is a patient of Tammy [name] from [facility], currently waiting to transition to [facility] and needs some help with pain medication.   She suffers from chronic lower back pain  with sciatica. She was seen in the ED last month for pain flare and was treated with steroids/NSAIDs and oxycodone . She is awaiting an appointment with a specialist regarding her hip and back.       Objective:     Physical Exam Constitutional:      General: She is not in acute distress. Pulmonary:     Effort: Pulmonary effort is normal.  Neurological:     Mental Status: She is alert and oriented to person, place, and time.  Psychiatric:        Mood and Affect: Mood normal.         Assessment & Plan:   1. Sciatica of right side (Primary) Follow up with a specialist as planned   - naproxen  sodium (ALEVE ) 220 MG tablet; Take 1 tablet (220 mg total) by mouth 2 (two) times daily as needed (pain).  Dispense: 90 tablet; Refill: 1   Follow Up Instructions: I discussed the assessment and treatment plan with the patient. The patient was provided an opportunity to ask questions and all were answered. The patient agreed with the plan and demonstrated an understanding of the instructions.  A copy of instructions were sent to the patient via MyChart unless otherwise noted below.    The patient was advised to call back or seek an  in-person evaluation if the symptoms worsen or if the condition fails to improve as anticipated.    Lauraine Kitty, FNP  **Disclaimer: This note may have been dictated with voice recognition software. Similar sounding words can inadvertently be transcribed and this note may contain transcription errors which may not have been corrected upon publication of note.**

## 2024-01-05 ENCOUNTER — Other Ambulatory Visit: Payer: Self-pay | Admitting: Student

## 2024-01-05 DIAGNOSIS — M1612 Unilateral primary osteoarthritis, left hip: Secondary | ICD-10-CM

## 2024-01-10 ENCOUNTER — Telehealth: Admitting: Nurse Practitioner

## 2024-01-10 DIAGNOSIS — K219 Gastro-esophageal reflux disease without esophagitis: Secondary | ICD-10-CM

## 2024-01-10 DIAGNOSIS — I10 Essential (primary) hypertension: Secondary | ICD-10-CM

## 2024-01-10 DIAGNOSIS — E1169 Type 2 diabetes mellitus with other specified complication: Secondary | ICD-10-CM | POA: Diagnosis not present

## 2024-01-10 DIAGNOSIS — E1165 Type 2 diabetes mellitus with hyperglycemia: Secondary | ICD-10-CM

## 2024-01-10 DIAGNOSIS — G8929 Other chronic pain: Secondary | ICD-10-CM

## 2024-01-10 DIAGNOSIS — M25559 Pain in unspecified hip: Secondary | ICD-10-CM

## 2024-01-10 DIAGNOSIS — F339 Major depressive disorder, recurrent, unspecified: Secondary | ICD-10-CM | POA: Diagnosis not present

## 2024-01-10 DIAGNOSIS — J309 Allergic rhinitis, unspecified: Secondary | ICD-10-CM

## 2024-01-10 DIAGNOSIS — E785 Hyperlipidemia, unspecified: Secondary | ICD-10-CM

## 2024-01-10 DIAGNOSIS — K589 Irritable bowel syndrome without diarrhea: Secondary | ICD-10-CM

## 2024-01-10 MED ORDER — DICYCLOMINE HCL 10 MG PO CAPS
20.0000 mg | ORAL_CAPSULE | Freq: Every day | ORAL | 0 refills | Status: DC
Start: 2024-01-10 — End: 2024-02-09

## 2024-01-10 MED ORDER — FLUTICASONE PROPIONATE 50 MCG/ACT NA SUSP: 2.0000 | Freq: Every day | NASAL | 0 refills | Status: DC

## 2024-01-10 MED ORDER — HYDRALAZINE HCL 100 MG PO TABS: 100.0000 mg | ORAL_TABLET | Freq: Three times a day (TID) | ORAL | 0 refills | Status: DC

## 2024-01-10 MED ORDER — AMLODIPINE BESYLATE 10 MG PO TABS: 10.0000 mg | ORAL_TABLET | Freq: Every day | ORAL | 0 refills | Status: DC

## 2024-01-10 MED ORDER — MONTELUKAST SODIUM 10 MG PO TABS: 10.0000 mg | ORAL_TABLET | Freq: Every day | ORAL | 0 refills | Status: DC

## 2024-01-10 MED ORDER — SERTRALINE HCL 100 MG PO TABS: 150.0000 mg | ORAL_TABLET | Freq: Every day | ORAL | 0 refills | Status: DC

## 2024-01-10 MED ORDER — PANTOPRAZOLE SODIUM 40 MG PO TBEC: 40.0000 mg | DELAYED_RELEASE_TABLET | Freq: Every day | ORAL | 0 refills | Status: DC

## 2024-01-10 MED ORDER — LOSARTAN POTASSIUM 50 MG PO TABS: 50.0000 mg | ORAL_TABLET | Freq: Every day | ORAL | 0 refills | Status: DC

## 2024-01-10 MED ORDER — CETIRIZINE HCL 10 MG PO TABS: 10.0000 mg | ORAL_TABLET | Freq: Every day | ORAL | 0 refills | Status: DC

## 2024-01-10 MED ORDER — ATORVASTATIN CALCIUM 20 MG PO TABS: 20.0000 mg | ORAL_TABLET | Freq: Every day | ORAL | 0 refills | Status: DC

## 2024-01-10 MED ORDER — CYCLOBENZAPRINE HCL 10 MG PO TABS: 10.0000 mg | ORAL_TABLET | Freq: Every day | ORAL | 0 refills | Status: DC

## 2024-01-10 NOTE — Progress Notes (Signed)
 Acute Video Visit    Virtual Visit Consent:   Tammy [name], you are scheduled for a virtual visit with a [facility] provider today.     Just as with appointments in the office, your consent must be obtained to participate.  Your consent will be active for this visit and any virtual visit you may have with one of our providers in the next 365 days.     If you have a MyChart account, a copy of this consent can be sent to you electronically.  All virtual visits are billed to your insurance company just like a traditional visit in the office.    If the connection with a video visit is poor, the visit may have to be switched to a telephone visit.  With either a video or telephone visit, we are not always able to ensure that we have a secure connection.     I need to obtain your verbal consent now.   Are you willing to proceed with your visit today?    Tammy [name] has provided verbal consent on 01/10/2024 for a virtual visit (video or telephone).   Lauraine Kitty, FNP  Date: 01/10/2024 3:24 PM  Subjective:     Patient ID: Tammy [name], female    DOB: Aug 10, 1955, 68 y.o.   MRN: [id number]  LILLETTE Lauraine Kitty, connected with  YAZLIN EKBLAD  ([id number], 1955-08-08) on 01/10/24 at  3:40 PM EDT by a video-enabled telemedicine application and verified that I am speaking with the correct person using two identifiers.   Location: Patient: [facility]  Provider: Virtual Visit Location Provider: Home Office   Patient is homeless without transportation requesting virtual visit due to SDOH needs   I discussed the limitations of evaluation and management by telemedicine and the availability of in person appointments. The patient expressed understanding and agreed to proceed.     HPI  Tammy [name] is a 68 y.o. who identifies as a female who was assigned female at birth, and is being seen today for assistance in medication refills.  She receives care at the [facility] for primary care and SDOH  needs.   She has seen Ronal Caldron Placey in the past and is awaiting a new PCP appointment next month. Requesting assistance in medication refills until that time.  She has been seen by [facility] in the past for chronic pain and sciatica  Her medical history is significant for HTN, varicose veins, Sleep apnea, GERD, DMII, depression, chronci pain of left hip  She is in the process of applying for disability and is gettign worked up for a hip replacement through orthopedics.      Objective:      Physical Exam Constitutional:      General: She is not in acute distress.    Appearance: Normal appearance. She is not ill-appearing.  HENT:     Nose: Nose normal.     Mouth/Throat:     Mouth: Mucous membranes are moist.  Pulmonary:     Effort: Pulmonary effort is normal.  Neurological:     Mental Status: She is alert and oriented to person, place, and time.  Psychiatric:        Mood and Affect: Mood normal.          Assessment & Plan:    Encouraged to return to [facility] prior to new PCP appointment for any acute needs  30 day refills provided for patient   1. Essential hypertension (Primary) - amLODipine (  NORVASC) 10 MG tablet; Take 1 tablet (10 mg total) by mouth daily.  Dispense: 30 tablet; Refill: 0 - hydrALAZINE (APRESOLINE) 100 MG tablet; Take 1 tablet (100 mg total) by mouth 3 (three) times daily.  Dispense: 90 tablet; Refill: 0 - losartan (COZAAR) 50 MG tablet; Take 1 tablet (50 mg total) by mouth daily.  Dispense: 30 tablet; Refill: 0 - atorvastatin (LIPITOR) 20 MG tablet; Take 1 tablet (20 mg total) by mouth daily.  Dispense: 30 tablet; Refill: 0  2. Depression, recurrent - sertraline (ZOLOFT) 100 MG tablet; Take 1.5 tablets (150 mg total) by mouth daily.  Dispense: 45 tablet; Refill: 0  3. Type 2 diabetes mellitus with hyperglycemia, without long-term current use of insulin (HCC)  4. Hyperlipidemia associated with type 2 diabetes mellitus (HCC)  5. Hip pain, unspecified  laterality  6. Other chronic pain - cyclobenzaprine (FLEXERIL) 10 MG tablet; Take 1 tablet (10 mg total) by mouth at bedtime.  Dispense: 30 tablet; Refill: 0  7. Irritable bowel syndrome, unspecified type - dicyclomine (BENTYL) 10 MG capsule; Take 2 capsules (20 mg total) by mouth daily.  Dispense: 60 capsule; Refill: 0  8. Gastroesophageal reflux disease without esophagitis - pantoprazole  (PROTONIX ) 40 MG tablet; Take 1 tablet (40 mg total) by mouth daily.  Dispense: 30 tablet; Refill: 0  9. Allergic rhinitis, unspecified seasonality, unspecified trigger - cetirizine  (ZYRTEC ) 10 MG tablet; Take 1 tablet (10 mg total) by mouth daily.  Dispense: 30 tablet; Refill: 0 - fluticasone (FLONASE) 50 MCG/ACT nasal spray; Place 2 sprays into both nostrils daily.  Dispense: 16 g; Refill: 0 - montelukast (SINGULAIR) 10 MG tablet; Take 1 tablet (10 mg total) by mouth at bedtime.  Dispense: 30 tablet; Refill: 0    Follow Up Instructions: I discussed the assessment and treatment plan with the patient. The patient was provided an opportunity to ask questions and all were answered. The patient agreed with the plan and demonstrated an understanding of the instructions.  A copy of instructions were sent to the patient via MyChart unless otherwise noted below.    The patient was advised to call back or seek an in-person evaluation if the symptoms worsen or if the condition fails to improve as anticipated.    Lauraine Kitty, FNP  **Disclaimer: This note may have been dictated with voice recognition software. Similar sounding words can inadvertently be transcribed and this note may contain transcription errors which may not have been corrected upon publication of note.**

## 2024-01-13 ENCOUNTER — Other Ambulatory Visit: Payer: Self-pay

## 2024-01-13 MED ORDER — DICYCLOMINE HCL 10 MG PO CAPS
20.0000 mg | ORAL_CAPSULE | Freq: Every day | ORAL | 0 refills | Status: DC
  Filled 2024-01-13: qty 60, 30d supply, fill #0

## 2024-01-13 MED ORDER — CYCLOBENZAPRINE HCL 10 MG PO TABS
10.0000 mg | ORAL_TABLET | Freq: Every day | ORAL | 0 refills | Status: DC
Start: 2024-01-13 — End: 2024-02-14
  Filled 2024-01-13: qty 30, 30d supply, fill #0

## 2024-01-13 MED ORDER — ATORVASTATIN CALCIUM 20 MG PO TABS
20.0000 mg | ORAL_TABLET | Freq: Every day | ORAL | 0 refills | Status: DC
  Filled 2024-01-13: qty 30, 30d supply, fill #0

## 2024-01-13 MED ORDER — FLUTICASONE PROPIONATE 50 MCG/ACT NA SUSP
2.0000 | Freq: Every day | NASAL | 0 refills | Status: AC
  Filled 2024-01-13: qty 16, 30d supply, fill #0

## 2024-01-13 MED ORDER — CETIRIZINE HCL 10 MG PO TABS
10.0000 mg | ORAL_TABLET | Freq: Every day | ORAL | 0 refills | Status: DC
  Filled 2024-01-13: qty 30, 30d supply, fill #0

## 2024-01-13 MED ORDER — HYDRALAZINE HCL 100 MG PO TABS
100.0000 mg | ORAL_TABLET | Freq: Three times a day (TID) | ORAL | 0 refills | Status: DC
  Filled 2024-01-13: qty 90, 30d supply, fill #0

## 2024-01-13 MED ORDER — LOSARTAN POTASSIUM 50 MG PO TABS
50.0000 mg | ORAL_TABLET | Freq: Every day | ORAL | 0 refills | Status: DC
  Filled 2024-01-13: qty 30, 30d supply, fill #0

## 2024-01-13 MED ORDER — AMLODIPINE BESYLATE 10 MG PO TABS
10.0000 mg | ORAL_TABLET | Freq: Every day | ORAL | 0 refills | Status: DC
  Filled 2024-01-13: qty 30, 30d supply, fill #0

## 2024-01-13 MED ORDER — MONTELUKAST SODIUM 10 MG PO TABS
10.0000 mg | ORAL_TABLET | Freq: Every day | ORAL | 0 refills | Status: DC
  Filled 2024-01-13: qty 30, 30d supply, fill #0

## 2024-01-13 NOTE — Addendum Note (Signed)
 Addended by: KENNYTH LAURAINE BRAVO on: 01/13/2024 10:43 AM   Modules accepted: Orders

## 2024-01-14 NOTE — Discharge Instructions (Signed)

## 2024-01-17 ENCOUNTER — Ambulatory Visit
Admission: RE | Admit: 2024-01-17 | Discharge: 2024-01-17 | Disposition: A | Discharge status: Home / Self Care | Source: Ambulatory Visit | Attending: Student | Admitting: Student

## 2024-01-17 DIAGNOSIS — M1612 Unilateral primary osteoarthritis, left hip: Secondary | ICD-10-CM

## 2024-01-17 MED ORDER — IOPAMIDOL (ISOVUE-M 200) INJECTION 41%
1.0000 mL | Freq: Once | INTRAMUSCULAR | Status: AC
  Administered 2024-01-17: 1 mL via INTRA_ARTICULAR

## 2024-01-17 MED ORDER — METHYLPREDNISOLONE ACETATE 40 MG/ML INJ SUSP (RADIOLOG
80.0000 mg | Freq: Once | INTRAMUSCULAR | Status: AC
  Administered 2024-01-17: 80 mg via INTRA_ARTICULAR

## 2024-01-17 NOTE — Procedures (Signed)
 Radiology Procedure Note  Risks and benefits of joint injection were discussed with the patient including, but not limited to bleeding, infection, damage to adjacent structures, allergic reaction, and no pain relief.    All of the patient's questions were answered, patient is agreeable to proceed. Consent signed and in chart.  A timeout was performed with all members of the team prior to start of the procedure. Correct patient and correct procedure was confirmed. Allergies were reviewed.   PROCEDURE SUMMARY:  Successful fluoro guided left hip steroid injection. No immediate complications.  Patient tolerated well.   EBL = trace  Please see full dictation in imaging section of Epic for procedure details.   Skarlett Sedlacek H Romel Dumond PA-C 01/17/2024 8:18 AM

## 2024-02-14 ENCOUNTER — Other Ambulatory Visit: Payer: Self-pay

## 2024-02-14 ENCOUNTER — Other Ambulatory Visit: Payer: Self-pay | Admitting: Nurse Practitioner

## 2024-02-14 ENCOUNTER — Encounter: Payer: Self-pay | Admitting: Nurse Practitioner

## 2024-02-14 DIAGNOSIS — K589 Irritable bowel syndrome without diarrhea: Secondary | ICD-10-CM

## 2024-02-14 DIAGNOSIS — K219 Gastro-esophageal reflux disease without esophagitis: Secondary | ICD-10-CM

## 2024-02-14 DIAGNOSIS — G8929 Other chronic pain: Secondary | ICD-10-CM | POA: Diagnosis not present

## 2024-02-14 DIAGNOSIS — F339 Major depressive disorder, recurrent, unspecified: Secondary | ICD-10-CM

## 2024-02-14 DIAGNOSIS — I10 Essential (primary) hypertension: Secondary | ICD-10-CM | POA: Diagnosis not present

## 2024-02-14 DIAGNOSIS — H6121 Impacted cerumen, right ear: Secondary | ICD-10-CM

## 2024-02-14 DIAGNOSIS — B3731 Acute candidiasis of vulva and vagina: Secondary | ICD-10-CM

## 2024-02-14 DIAGNOSIS — J309 Allergic rhinitis, unspecified: Secondary | ICD-10-CM

## 2024-02-14 DIAGNOSIS — E119 Type 2 diabetes mellitus without complications: Secondary | ICD-10-CM

## 2024-02-14 MED ORDER — AMLODIPINE BESYLATE 10 MG PO TABS
10.0000 mg | ORAL_TABLET | Freq: Every day | ORAL | 0 refills | Status: DC
  Filled 2024-02-14: qty 30, 30d supply, fill #0

## 2024-02-14 MED ORDER — ATORVASTATIN CALCIUM 20 MG PO TABS
20.0000 mg | ORAL_TABLET | Freq: Every day | ORAL | 0 refills | Status: DC
  Filled 2024-02-14: qty 30, 30d supply, fill #0

## 2024-02-14 MED ORDER — HYDRALAZINE HCL 100 MG PO TABS
100.0000 mg | ORAL_TABLET | Freq: Four times a day (QID) | ORAL | 0 refills | Status: DC
  Filled 2024-02-14: qty 120, 30d supply, fill #0

## 2024-02-14 MED ORDER — DEBROX 6.5 % OT SOLN
5.0000 [drp] | Freq: Two times a day (BID) | OTIC | 0 refills | Status: AC
  Filled 2024-02-14: qty 15, 30d supply, fill #0

## 2024-02-14 MED ORDER — DICYCLOMINE HCL 10 MG PO CAPS
20.0000 mg | ORAL_CAPSULE | Freq: Every day | ORAL | 0 refills | Status: AC
Start: 2024-02-14 — End: 2024-03-17
  Filled 2024-02-14: qty 60, 30d supply, fill #0

## 2024-02-14 MED ORDER — CYCLOBENZAPRINE HCL 10 MG PO TABS
10.0000 mg | ORAL_TABLET | Freq: Every day | ORAL | 0 refills | Status: DC
  Filled 2024-02-14: qty 30, 30d supply, fill #0

## 2024-02-14 MED ORDER — CLOTRIMAZOLE 1 % EX CREA
1.0000 | TOPICAL_CREAM | Freq: Two times a day (BID) | CUTANEOUS | 0 refills | Status: DC
  Filled 2024-02-14: qty 30, 15d supply, fill #0

## 2024-02-14 MED ORDER — CETIRIZINE HCL 10 MG PO TABS
10.0000 mg | ORAL_TABLET | Freq: Every day | ORAL | 0 refills | Status: DC
  Filled 2024-02-14: qty 30, 30d supply, fill #0

## 2024-02-14 MED ORDER — METFORMIN HCL 500 MG PO TABS
500.0000 mg | ORAL_TABLET | Freq: Two times a day (BID) | ORAL | 0 refills | Status: DC
  Filled 2024-02-14: qty 60, 30d supply, fill #0

## 2024-02-14 MED ORDER — CLONIDINE HCL 0.2 MG PO TABS
0.2000 mg | ORAL_TABLET | Freq: Two times a day (BID) | ORAL | 0 refills | Status: DC
  Filled 2024-02-14: qty 60, 30d supply, fill #0

## 2024-02-14 MED ORDER — MONTELUKAST SODIUM 10 MG PO TABS
10.0000 mg | ORAL_TABLET | Freq: Every day | ORAL | 0 refills | Status: DC
  Filled 2024-02-14: qty 30, 30d supply, fill #0

## 2024-02-14 MED ORDER — SERTRALINE HCL 100 MG PO TABS
150.0000 mg | ORAL_TABLET | Freq: Every day | ORAL | 0 refills | Status: DC
  Filled 2024-02-14: qty 45, 30d supply, fill #0

## 2024-02-14 MED ORDER — PANTOPRAZOLE SODIUM 40 MG PO TBEC
40.0000 mg | DELAYED_RELEASE_TABLET | Freq: Every day | ORAL | 0 refills | Status: DC
  Filled 2024-02-14: qty 30, 30d supply, fill #0

## 2024-02-14 MED ORDER — LOSARTAN POTASSIUM 50 MG PO TABS
50.0000 mg | ORAL_TABLET | Freq: Every day | ORAL | 0 refills | Status: DC
  Filled 2024-02-14: qty 30, 30d supply, fill #0

## 2024-02-14 NOTE — Progress Notes (Signed)
 Acute Video Visit    Virtual Visit Consent:   Tammy [name], you are scheduled for a virtual visit with a [facility] provider today.     Just as with appointments in the office, your consent must be obtained to participate.  Your consent will be active for this visit and any virtual visit you may have with one of our providers in the next 365 days.     If you have a MyChart account, a copy of this consent can be sent to you electronically.  All virtual visits are billed to your insurance company just like a traditional visit in the office.    If the connection with a video visit is poor, the visit may have to be switched to a telephone visit.  With either a video or telephone visit, we are not always able to ensure that we have a secure connection.     I need to obtain your verbal consent now.   Are you willing to proceed with your visit today?    Tammy [name] has provided verbal consent on 02/14/2024 for a virtual visit (video or telephone).   Lauraine Kitty, FNP  Date: 02/14/2024 4:31 PM  Subjective:     Patient ID: Tammy [name], female    DOB: [birth year], 68 y.o.   MRN: [id number]  I, Lauraine Kitty, connected with  Tammy [name]  ([id number], 06/21/1955) on 02/14/24 at  by a video-enabled telemedicine application and verified that I am speaking with the correct person using two identifiers.   Location: Patient: [facility]  Provider: Virtual Visit Location Provider: Home Office   I discussed the limitations of evaluation and management by telemedicine and the availability of in person appointments. The patient expressed understanding and agreed to proceed.     HPI Tammy [name] is a 68 y.o. who identifies as a female who was assigned female at birth, and is being seen today for medication refills. She is presenting to the [facility] today, scheduled to start with primary are services under  at [facility] next month. She is out of medications from last month and requesting  one additional refill to carry her to new patient appointment.   She is also complaining of a yeast like rash between her thighs, and wax impaction in her right ear canal.   No other acute complaints today       Objective:     Physical Exam Constitutional:      Appearance: Normal appearance.  Pulmonary:     Effort: Pulmonary effort is normal.  Neurological:     Mental Status: She is alert and oriented to person, place, and time.  Psychiatric:        Mood and Affect: Mood normal.         Assessment & Plan:    Return in December for next appointment with PCP may utilize [facility] while awaiting appointment for any other acute needs    Problem List Items Addressed This Visit       Cardiovascular and Mediastinum   HYPERTENSION   Relevant Medications   amLODipine  (NORVASC ) 10 MG tablet   hydrALAZINE  (APRESOLINE ) 100 MG tablet   atorvastatin  (LIPITOR) 20 MG tablet   losartan  (COZAAR ) 50 MG tablet   cloNIDine  (CATAPRES ) 0.2 MG tablet     Digestive   GERD   Relevant Medications   pantoprazole  (PROTONIX ) 40 MG tablet   dicyclomine  (BENTYL ) 10 MG capsule   Other Visit Diagnoses  Impacted cerumen of right ear    -  Primary   Relevant Medications   carbamide peroxide (DEBROX) 6.5 % OTIC solution     Depression, recurrent       Relevant Medications   sertraline  (ZOLOFT ) 100 MG tablet     Other chronic pain       Relevant Medications   sertraline  (ZOLOFT ) 100 MG tablet   cyclobenzaprine  (FLEXERIL ) 10 MG tablet     Allergic rhinitis, unspecified seasonality, unspecified trigger       Relevant Medications   cetirizine  (ZYRTEC ) 10 MG tablet   montelukast  (SINGULAIR ) 10 MG tablet     Vaginal yeast infection       Relevant Medications   clotrimazole  (LOTRIMIN ) 1 % cream     Irritable bowel syndrome, unspecified type       Relevant Medications   pantoprazole  (PROTONIX ) 40 MG tablet   dicyclomine  (BENTYL ) 10 MG capsule     Diabetes mellitus without complication (HCC)        Relevant Medications   atorvastatin  (LIPITOR) 20 MG tablet   losartan  (COZAAR ) 50 MG tablet   metFORMIN  (GLUCOPHAGE ) 500 MG tablet       Meds ordered this encounter  Medications   amLODipine  (NORVASC ) 10 MG tablet    Sig: Take 1 tablet (10 mg total) by mouth daily.    Dispense:  30 tablet    Refill:  0   hydrALAZINE  (APRESOLINE ) 100 MG tablet    Sig: Take 1 tablet (100 mg total) by mouth in the morning, at noon, in the evening, and at bedtime.    Dispense:  120 tablet    Refill:  0   atorvastatin  (LIPITOR) 20 MG tablet    Sig: Take 1 tablet (20 mg total) by mouth daily.    Dispense:  30 tablet    Refill:  0   losartan  (COZAAR ) 50 MG tablet    Sig: Take 1 tablet (50 mg total) by mouth daily.    Dispense:  30 tablet    Refill:  0   sertraline  (ZOLOFT ) 100 MG tablet    Sig: Take 1.5 tablets (150 mg total) by mouth daily.    Dispense:  45 tablet    Refill:  0   cyclobenzaprine  (FLEXERIL ) 10 MG tablet    Sig: Take 1 tablet (10 mg total) by mouth at bedtime.    Dispense:  30 tablet    Refill:  0   pantoprazole  (PROTONIX ) 40 MG tablet    Sig: Take 1 tablet (40 mg total) by mouth daily.    Dispense:  30 tablet    Refill:  0   cetirizine  (ZYRTEC ) 10 MG tablet    Sig: Take 1 tablet (10 mg total) by mouth daily.    Dispense:  30 tablet    Refill:  0   montelukast  (SINGULAIR ) 10 MG tablet    Sig: Take 1 tablet (10 mg total) by mouth at bedtime.    Dispense:  30 tablet    Refill:  0   clotrimazole  (LOTRIMIN ) 1 % cream    Sig: Apply 1 Application topically 2 (two) times daily.    Dispense:  30 g    Refill:  0   carbamide peroxide (DEBROX) 6.5 % OTIC solution    Sig: Place 5 drops into the right ear 2 (two) times daily.    Dispense:  15 mL    Refill:  0   dicyclomine  (BENTYL ) 10 MG capsule    Sig: Take  2 capsules (20 mg total) by mouth daily.    Dispense:  60 capsule    Refill:  0   metFORMIN  (GLUCOPHAGE ) 500 MG tablet    Sig: Take 1 tablet (500 mg total) by mouth 2  (two) times daily with a meal.    Dispense:  60 tablet    Refill:  0   cloNIDine  (CATAPRES ) 0.2 MG tablet    Sig: Take 1 tablet (0.2 mg total) by mouth 2 (two) times daily.    Dispense:  60 tablet    Refill:  0    No follow-ups on file.  Follow Up Instructions: I discussed the assessment and treatment plan with the patient. The patient was provided an opportunity to ask questions and all were answered. The patient agreed with the plan and demonstrated an understanding of the instructions.  A copy of instructions were sent to the patient via MyChart unless otherwise noted below.    The patient was advised to call back or seek an in-person evaluation if the symptoms worsen or if the condition fails to improve as anticipated.    Lauraine Kitty, FNP  **Disclaimer: This note may have been dictated with voice recognition software. Similar sounding words can inadvertently be transcribed and this note may contain transcription errors which may not have been corrected upon publication of note.**

## 2024-02-15 ENCOUNTER — Other Ambulatory Visit: Payer: Self-pay

## 2024-02-24 NOTE — Progress Notes (Signed)
 Acute Video Visit     Virtual Visit Consent:    Tammy [name], you are scheduled for a virtual visit with a [facility] provider today.     Just as with appointments in the office, your consent must be obtained to participate.  Your consent will be active for this visit and any virtual visit you may have with one of our providers in the next 365 days.     If you have a MyChart account, a copy of this consent can be sent to you electronically.  All virtual visits are billed to your insurance company just like a traditional visit in the office.     If the connection with a video visit is poor, the visit may have to be switched to a telephone visit.  With either a video or telephone visit, we are not always able to ensure that we have a secure connection.      I need to obtain your verbal consent now.   Are you willing to proceed with your visit today?    Tammy [name] has provided verbal consent on 02/14/2024 for a virtual visit (video or telephone).   Tammy Kitty, FNP  Date: 02/14/2024 4:31 PM   Subjective:    Subjective Patient ID: Tammy [name], female    DOB: 1955/09/30, 68 y.o.   MRN: [id number]   I, Tammy [name], connected with  Tammy [name]  ([id number], 10/16/1955) on 02/14/24 at  by a video-enabled telemedicine application and verified that I am speaking with the correct person using two identifiers.     Location: Patient: [facility]  Provider: Virtual Visit Location Provider: Home Office   I discussed the limitations of evaluation and management by telemedicine and the availability of in person appointments. The patient expressed understanding and agreed to proceed.       HPI Tammy [name] is a 68 y.o. who identifies as a female who was assigned female at birth, and is being seen today for medication refills. She is presenting to the [facility] today, scheduled to start with primary are services under [facility] next month. She is out of medications from last month  and requesting one additional refill to carry her to new patient appointment.    She is also complaining of a yeast like rash between her thighs, and wax impaction in her right ear canal.    No other acute complaints today          Objective:    Objective Physical Exam Constitutional:      Appearance: Normal appearance.  Pulmonary:     Effort: Pulmonary effort is normal.  Neurological:     Mental Status: She is alert and oriented to person, place, and time.  Psychiatric:        Mood and Affect: Mood normal.             Assessment & Plan:      Return in December for next appointment with PCP may utilize [facility] while awaiting appointment for any other acute needs      Problem List Items Addressed This Visit              Cardiovascular and Mediastinum    HYPERTENSION    Relevant Medications    amLODipine  (NORVASC ) 10 MG tablet    hydrALAZINE  (APRESOLINE ) 100 MG tablet    atorvastatin  (LIPITOR) 20 MG tablet    losartan  (COZAAR ) 50 MG tablet    cloNIDine  (CATAPRES ) 0.2 MG  tablet        Digestive    GERD    Relevant Medications    pantoprazole  (PROTONIX ) 40 MG tablet    dicyclomine  (BENTYL ) 10 MG capsule    Other Visit Diagnoses         Impacted cerumen of right ear    -  Primary    Relevant Medications    carbamide peroxide (DEBROX) 6.5 % OTIC solution      Depression, recurrent        Relevant Medications    sertraline  (ZOLOFT ) 100 MG tablet      Other chronic pain        Relevant Medications    sertraline  (ZOLOFT ) 100 MG tablet    cyclobenzaprine  (FLEXERIL ) 10 MG tablet      Allergic rhinitis, unspecified seasonality, unspecified trigger        Relevant Medications    cetirizine  (ZYRTEC ) 10 MG tablet    montelukast  (SINGULAIR ) 10 MG tablet      Vaginal yeast infection        Relevant Medications    clotrimazole  (LOTRIMIN ) 1 % cream      Irritable bowel syndrome, unspecified type        Relevant Medications    pantoprazole  (PROTONIX ) 40 MG tablet     dicyclomine  (BENTYL ) 10 MG capsule      Diabetes mellitus without complication (HCC)        Relevant Medications    atorvastatin  (LIPITOR) 20 MG tablet    losartan  (COZAAR ) 50 MG tablet    metFORMIN  (GLUCOPHAGE ) 500 MG tablet               Meds ordered this encounter  Medications   amLODipine  (NORVASC ) 10 MG tablet      Sig: Take 1 tablet (10 mg total) by mouth daily.      Dispense:  30 tablet      Refill:  0   hydrALAZINE  (APRESOLINE ) 100 MG tablet      Sig: Take 1 tablet (100 mg total) by mouth in the morning, at noon, in the evening, and at bedtime.      Dispense:  120 tablet      Refill:  0   atorvastatin  (LIPITOR) 20 MG tablet      Sig: Take 1 tablet (20 mg total) by mouth daily.      Dispense:  30 tablet      Refill:  0   losartan  (COZAAR ) 50 MG tablet      Sig: Take 1 tablet (50 mg total) by mouth daily.      Dispense:  30 tablet      Refill:  0   sertraline  (ZOLOFT ) 100 MG tablet      Sig: Take 1.5 tablets (150 mg total) by mouth daily.      Dispense:  45 tablet      Refill:  0   cyclobenzaprine  (FLEXERIL ) 10 MG tablet      Sig: Take 1 tablet (10 mg total) by mouth at bedtime.      Dispense:  30 tablet      Refill:  0   pantoprazole  (PROTONIX ) 40 MG tablet      Sig: Take 1 tablet (40 mg total) by mouth daily.      Dispense:  30 tablet      Refill:  0   cetirizine  (ZYRTEC ) 10 MG tablet      Sig: Take 1 tablet (10 mg total) by mouth daily.  Dispense:  30 tablet      Refill:  0   montelukast  (SINGULAIR ) 10 MG tablet      Sig: Take 1 tablet (10 mg total) by mouth at bedtime.      Dispense:  30 tablet      Refill:  0   clotrimazole  (LOTRIMIN ) 1 % cream      Sig: Apply 1 Application topically 2 (two) times daily.      Dispense:  30 g      Refill:  0   carbamide peroxide (DEBROX) 6.5 % OTIC solution      Sig: Place 5 drops into the right ear 2 (two) times daily.      Dispense:  15 mL      Refill:  0   dicyclomine  (BENTYL ) 10 MG capsule      Sig: Take 2  capsules (20 mg total) by mouth daily.      Dispense:  60 capsule      Refill:  0   metFORMIN  (GLUCOPHAGE ) 500 MG tablet      Sig: Take 1 tablet (500 mg total) by mouth 2 (two) times daily with a meal.      Dispense:  60 tablet      Refill:  0   cloNIDine  (CATAPRES ) 0.2 MG tablet      Sig: Take 1 tablet (0.2 mg total) by mouth 2 (two) times daily.      Dispense:  60 tablet      Refill:  0      No follow-ups on file.   Follow Up Instructions: I discussed the assessment and treatment plan with the patient. The patient was provided an opportunity to ask questions and all were answered. The patient agreed with the plan and demonstrated an understanding of the instructions.  A copy of instructions were sent to the patient via MyChart unless otherwise noted below.      The patient was advised to call back or seek an in-person evaluation if the symptoms worsen or if the condition fails to improve as anticipated.       Tammy Kitty, FNP   **Disclaimer: This note may have been dictated with voice recognition software. Similar sounding words can inadvertently be transcribed and this note may contain transcription errors which may not have been corrected upon publication of note.**     Order-Level Documents:  There are no order-level documents. Diagnoses   Codes Comments  Impacted cerumen of right ear  - Primary H61.21   Essential hypertension I10   Depression, recurrent F33.9   Other chronic pain G89.29   Gastroesophageal reflux disease without esophagitis K21.9   Allergic rhinitis, unspecified seasonality, unspecified trigger J30.9   Vaginal yeast infection B37.31   Irritable bowel syndrome, unspecified type K58.9   Diabetes mellitus without complication (HCC) E11.9         Medications Ordered This Encounter   Disp Refills Start End  amLODipine  (NORVASC ) 10 MG tablet 30 tablet 0 02/14/2024 --  Take 1 tablet (10 mg total) by mouth daily. - Oral  hydrALAZINE  (APRESOLINE ) 100  MG tablet 120 tablet 0 02/14/2024 03/17/2024  Take 1 tablet (100 mg total) by mouth in the morning, at noon, in the evening, and at bedtime. - Oral  atorvastatin  (LIPITOR) 20 MG tablet 30 tablet 0 02/14/2024 --  Take 1 tablet (20 mg total) by mouth daily. - Oral  losartan  (COZAAR ) 50 MG tablet 30 tablet 0 02/14/2024 --  Take 1 tablet (50 mg total)  by mouth daily. - Oral  sertraline  (ZOLOFT ) 100 MG tablet 45 tablet 0 02/14/2024 --  Take 1.5 tablets (150 mg total) by mouth daily. - Oral  cyclobenzaprine  (FLEXERIL ) 10 MG tablet 30 tablet 0 02/14/2024 --  Take 1 tablet (10 mg total) by mouth at bedtime. - Oral  pantoprazole  (PROTONIX ) 40 MG tablet 30 tablet 0 02/14/2024 --  Take 1 tablet (40 mg total) by mouth daily. - Oral  cetirizine  (ZYRTEC ) 10 MG tablet 30 tablet 0 02/14/2024 --  Take 1 tablet (10 mg total) by mouth daily. - Oral  montelukast  (SINGULAIR ) 10 MG tablet 30 tablet 0 02/14/2024 --  Take 1 tablet (10 mg total) by mouth at bedtime. - Oral  clotrimazole  (LOTRIMIN ) 1 % cream 30 g 0 02/14/2024 --  Apply 1 Application topically 2 (two) times daily. - Topical  carbamide peroxide (DEBROX) 6.5 % OTIC solution 15 mL 0 02/14/2024 --  Place 5 drops into the right ear 2 (two) times daily. - Right EAR  dicyclomine  (BENTYL ) 10 MG capsule 60 capsule 0 02/14/2024 03/17/2024  Take 2 capsules (20 mg total) by mouth daily. - Oral  metFORMIN  (GLUCOPHAGE ) 500 MG tablet 60 tablet 0 02/14/2024 --  Take 1 tablet (500 mg total) by mouth 2 (two) times daily with a meal. - Oral  cloNIDine  (CATAPRES ) 0.2 MG tablet 60 tablet 0 02/14/2024 --  Take 1 tablet (0.2 mg total) by mouth 2 (two) times daily. - Oral   Discontinued Medications   Reason for Discontinue   hydrALAZINE  (APRESOLINE ) 50 MG tablet      cetirizine  (ZYRTEC  ALLERGY) 10 MG tablet      methocarbamol  (ROBAXIN ) 500 MG tablet      naproxen  sodium (ALEVE ) 220 MG tablet      cloNIDine  (CATAPRES ) 0.2 MG tablet   Reorder   metFORMIN   (GLUCOPHAGE ) 500 MG tablet   Reorder   pantoprazole  (PROTONIX ) 40 MG tablet   Reorder   sertraline  (ZOLOFT ) 100 MG tablet   Reorder   amLODipine  (NORVASC ) 10 MG tablet   Reorder   atorvastatin  (LIPITOR) 20 MG tablet   Reorder   cetirizine  (ZYRTEC ) 10 MG tablet   Reorder   cyclobenzaprine  (FLEXERIL ) 10 MG tablet   Reorder   dicyclomine  (BENTYL ) 10 MG capsule   Reorder   hydrALAZINE  (APRESOLINE ) 100 MG tablet   Reorder   losartan  (COZAAR ) 50 MG tablet   Reorder   montelukast  (SINGULAIR ) 10 MG tablet   Reorder      Orders  Order Information

## 2024-03-08 ENCOUNTER — Ambulatory Visit

## 2024-03-13 ENCOUNTER — Other Ambulatory Visit: Payer: Self-pay | Admitting: *Deleted

## 2024-03-13 DIAGNOSIS — G8929 Other chronic pain: Secondary | ICD-10-CM

## 2024-03-13 DIAGNOSIS — J309 Allergic rhinitis, unspecified: Secondary | ICD-10-CM

## 2024-03-13 DIAGNOSIS — E119 Type 2 diabetes mellitus without complications: Secondary | ICD-10-CM

## 2024-03-13 DIAGNOSIS — I10 Essential (primary) hypertension: Secondary | ICD-10-CM

## 2024-03-13 DIAGNOSIS — F339 Major depressive disorder, recurrent, unspecified: Secondary | ICD-10-CM

## 2024-03-13 DIAGNOSIS — B3731 Acute candidiasis of vulva and vagina: Secondary | ICD-10-CM

## 2024-03-13 DIAGNOSIS — K219 Gastro-esophageal reflux disease without esophagitis: Secondary | ICD-10-CM

## 2024-03-13 MED ORDER — FAMOTIDINE 20 MG PO TABS: 20.0000 mg | ORAL_TABLET | Freq: Every day | ORAL | 0 refills | Status: DC

## 2024-03-13 MED ORDER — CYCLOBENZAPRINE HCL 10 MG PO TABS: 10.0000 mg | ORAL_TABLET | Freq: Every day | ORAL | 0 refills | Status: DC

## 2024-03-13 MED ORDER — ATORVASTATIN CALCIUM 20 MG PO TABS: 20.0000 mg | ORAL_TABLET | Freq: Every day | ORAL | 0 refills | Status: DC

## 2024-03-13 MED ORDER — AMLODIPINE BESYLATE 10 MG PO TABS: 10.0000 mg | ORAL_TABLET | Freq: Every day | ORAL | 0 refills | Status: DC

## 2024-03-13 MED ORDER — PANTOPRAZOLE SODIUM 40 MG PO TBEC: 40.0000 mg | DELAYED_RELEASE_TABLET | Freq: Every day | ORAL | 0 refills | Status: DC

## 2024-03-13 MED ORDER — HYDRALAZINE HCL 100 MG PO TABS: 100.0000 mg | ORAL_TABLET | Freq: Four times a day (QID) | ORAL | 0 refills | Status: DC

## 2024-03-13 MED ORDER — SERTRALINE HCL 100 MG PO TABS: 150.0000 mg | ORAL_TABLET | Freq: Every day | ORAL | 0 refills | Status: DC

## 2024-03-13 MED ORDER — CLONIDINE HCL 0.2 MG PO TABS: 0.2000 mg | ORAL_TABLET | Freq: Two times a day (BID) | ORAL | 0 refills | Status: DC

## 2024-03-13 MED ORDER — CLOTRIMAZOLE 1 % EX CREA: 1.0000 | TOPICAL_CREAM | Freq: Two times a day (BID) | CUTANEOUS | 0 refills | Status: AC

## 2024-03-13 MED ORDER — LOSARTAN POTASSIUM 50 MG PO TABS: 50.0000 mg | ORAL_TABLET | Freq: Every day | ORAL | 0 refills | Status: DC

## 2024-03-13 MED ORDER — IBUPROFEN 800 MG PO TABS: 800.0000 mg | ORAL_TABLET | Freq: Three times a day (TID) | ORAL | 0 refills | Status: DC | PRN

## 2024-03-13 MED ORDER — METFORMIN HCL 500 MG PO TABS: 500.0000 mg | ORAL_TABLET | Freq: Two times a day (BID) | ORAL | 0 refills | Status: DC

## 2024-03-13 MED ORDER — CETIRIZINE HCL 10 MG PO TABS: 10.0000 mg | ORAL_TABLET | Freq: Every day | ORAL | 0 refills | Status: DC

## 2024-03-13 NOTE — Progress Notes (Signed)
 Came by for routine follow up. Had the flu since last appointment.  Feels better.  Still coughing.  No respiratory red flags.  Knows it takes time. Feels bloated.  Takes Pantoprazole  and Pepcid .  No GI red flags.BP 150/80, PO 98% on room air.  P 59.  ([employer])  FCBG 109  Ok for refills requested.  Sent to [facility] per her request. She has a denial letter from [facility]. She will try OTC simethacone

## 2024-03-14 ENCOUNTER — Other Ambulatory Visit: Payer: Self-pay | Admitting: *Deleted

## 2024-03-14 ENCOUNTER — Other Ambulatory Visit: Payer: Self-pay

## 2024-03-14 DIAGNOSIS — J309 Allergic rhinitis, unspecified: Secondary | ICD-10-CM

## 2024-03-14 DIAGNOSIS — I10 Essential (primary) hypertension: Secondary | ICD-10-CM

## 2024-03-14 DIAGNOSIS — K219 Gastro-esophageal reflux disease without esophagitis: Secondary | ICD-10-CM

## 2024-03-14 DIAGNOSIS — E119 Type 2 diabetes mellitus without complications: Secondary | ICD-10-CM

## 2024-03-14 DIAGNOSIS — G8929 Other chronic pain: Secondary | ICD-10-CM

## 2024-03-14 DIAGNOSIS — R21 Rash and other nonspecific skin eruption: Secondary | ICD-10-CM

## 2024-03-14 DIAGNOSIS — F339 Major depressive disorder, recurrent, unspecified: Secondary | ICD-10-CM

## 2024-03-14 MED ORDER — HYDRALAZINE HCL 100 MG PO TABS
100.0000 mg | ORAL_TABLET | Freq: Four times a day (QID) | ORAL | 0 refills | Status: DC
  Filled 2024-03-14: qty 120, 30d supply, fill #0

## 2024-03-14 MED ORDER — METFORMIN HCL 500 MG PO TABS
500.0000 mg | ORAL_TABLET | Freq: Two times a day (BID) | ORAL | 0 refills | Status: DC
  Filled 2024-03-14: qty 60, 30d supply, fill #0

## 2024-03-14 MED ORDER — AMLODIPINE BESYLATE 10 MG PO TABS
10.0000 mg | ORAL_TABLET | Freq: Every day | ORAL | 0 refills | Status: DC
  Filled 2024-03-14: qty 30, 30d supply, fill #0

## 2024-03-14 MED ORDER — SERTRALINE HCL 100 MG PO TABS
150.0000 mg | ORAL_TABLET | Freq: Every day | ORAL | 0 refills | Status: DC
  Filled 2024-03-14: qty 45, 30d supply, fill #0

## 2024-03-14 MED ORDER — PANTOPRAZOLE SODIUM 40 MG PO TBEC
40.0000 mg | DELAYED_RELEASE_TABLET | Freq: Every day | ORAL | 0 refills | Status: DC
  Filled 2024-03-14: qty 30, 30d supply, fill #0

## 2024-03-14 MED ORDER — TRIAMCINOLONE ACETONIDE 0.1 % EX CREA
1.0000 | TOPICAL_CREAM | Freq: Two times a day (BID) | CUTANEOUS | 0 refills | Status: AC
  Filled 2024-03-14: qty 30, 15d supply, fill #0

## 2024-03-14 MED ORDER — CETIRIZINE HCL 10 MG PO TABS
10.0000 mg | ORAL_TABLET | Freq: Every day | ORAL | 0 refills | Status: DC
  Filled 2024-03-14: qty 30, 30d supply, fill #0

## 2024-03-14 MED ORDER — IBUPROFEN 800 MG PO TABS
800.0000 mg | ORAL_TABLET | Freq: Three times a day (TID) | ORAL | 0 refills | Status: DC | PRN
  Filled 2024-03-14: qty 90, 30d supply, fill #0

## 2024-03-14 MED ORDER — LOSARTAN POTASSIUM 50 MG PO TABS
50.0000 mg | ORAL_TABLET | Freq: Every day | ORAL | 0 refills | Status: DC
  Filled 2024-03-14: qty 30, 30d supply, fill #0

## 2024-03-14 MED ORDER — ATORVASTATIN CALCIUM 20 MG PO TABS
20.0000 mg | ORAL_TABLET | Freq: Every day | ORAL | 0 refills | Status: DC
  Filled 2024-03-14: qty 30, 30d supply, fill #0

## 2024-03-14 MED ORDER — FAMOTIDINE 20 MG PO TABS
20.0000 mg | ORAL_TABLET | Freq: Every day | ORAL | 0 refills | Status: DC
  Filled 2024-03-14: qty 30, 30d supply, fill #0

## 2024-03-14 MED ORDER — CLONIDINE HCL 0.2 MG PO TABS
0.2000 mg | ORAL_TABLET | Freq: Two times a day (BID) | ORAL | 0 refills | Status: DC
  Filled 2024-03-14: qty 60, 30d supply, fill #0

## 2024-03-14 MED ORDER — CYCLOBENZAPRINE HCL 10 MG PO TABS
10.0000 mg | ORAL_TABLET | Freq: Every day | ORAL | 0 refills | Status: DC
  Filled 2024-03-14: qty 30, 30d supply, fill #0

## 2024-03-14 NOTE — Progress Notes (Signed)
 [facility] wouldn't refill. Meds sent to [facility]

## 2024-03-15 ENCOUNTER — Other Ambulatory Visit: Payer: Self-pay

## 2024-03-28 ENCOUNTER — Other Ambulatory Visit: Payer: Self-pay

## 2024-03-28 NOTE — Progress Notes (Signed)
 The patient attended a virtual primary care appt on 02/14/2024, where her blood pressure was not taken due to it being a video visit. Pt does not have a smoking status, has insurance, has a PCP, SDOH screener was not conducted.   A chart review indicates Ronal Jenkins [facility] as her PCP. The patients most recent office visit was on 03/14/2024. She does not have any upcoming appts inficatesThere is currently no insurance indicated. During chart review, CHW located a Medicare card in the pt's media section from 02/01/2024. CHW did RTE to see if it would pop up in her chart. RTE did not place the Medicare in her chart and shows it has elapsed. Chart review indicates that the pt is currently getting SDOH support through [facility] on 03/07/2024 and 03/08/2024. Chart review indicates she is a former smoker as of 03/05/2024.   At this time, no additional support from the [facility] [employer] Team is indicated.

## 2024-04-11 ENCOUNTER — Other Ambulatory Visit: Payer: Self-pay | Admitting: *Deleted

## 2024-04-11 ENCOUNTER — Other Ambulatory Visit: Payer: Self-pay

## 2024-04-11 DIAGNOSIS — J209 Acute bronchitis, unspecified: Secondary | ICD-10-CM

## 2024-04-11 DIAGNOSIS — I10 Essential (primary) hypertension: Secondary | ICD-10-CM

## 2024-04-11 DIAGNOSIS — J4521 Mild intermittent asthma with (acute) exacerbation: Secondary | ICD-10-CM

## 2024-04-11 DIAGNOSIS — K219 Gastro-esophageal reflux disease without esophagitis: Secondary | ICD-10-CM

## 2024-04-11 DIAGNOSIS — F339 Major depressive disorder, recurrent, unspecified: Secondary | ICD-10-CM

## 2024-04-11 DIAGNOSIS — E119 Type 2 diabetes mellitus without complications: Secondary | ICD-10-CM

## 2024-04-11 DIAGNOSIS — J309 Allergic rhinitis, unspecified: Secondary | ICD-10-CM

## 2024-04-11 DIAGNOSIS — G8929 Other chronic pain: Secondary | ICD-10-CM

## 2024-04-11 MED ORDER — ALBUTEROL SULFATE HFA 108 (90 BASE) MCG/ACT IN AERS
1.0000 | INHALATION_SPRAY | Freq: Four times a day (QID) | RESPIRATORY_TRACT | 0 refills | Status: AC | PRN
  Filled 2024-04-11 – 2024-04-21 (×2): qty 6.7, 25d supply, fill #0

## 2024-04-11 MED ORDER — FAMOTIDINE 20 MG PO TABS
20.0000 mg | ORAL_TABLET | Freq: Every day | ORAL | 0 refills | Status: AC
  Filled 2024-04-11 – 2024-04-21 (×2): qty 30, 30d supply, fill #0

## 2024-04-11 MED ORDER — ATORVASTATIN CALCIUM 20 MG PO TABS
20.0000 mg | ORAL_TABLET | Freq: Every day | ORAL | 0 refills | Status: AC
  Filled 2024-04-11 – 2024-04-21 (×2): qty 30, 30d supply, fill #0

## 2024-04-11 MED ORDER — IBUPROFEN 800 MG PO TABS
800.0000 mg | ORAL_TABLET | Freq: Three times a day (TID) | ORAL | 0 refills | Status: AC | PRN
  Filled 2024-04-11 – 2024-04-21 (×2): qty 90, 30d supply, fill #0

## 2024-04-11 MED ORDER — CETIRIZINE HCL 10 MG PO TABS
10.0000 mg | ORAL_TABLET | Freq: Every day | ORAL | 0 refills | Status: AC
  Filled 2024-04-11 – 2024-04-21 (×2): qty 30, 30d supply, fill #0

## 2024-04-11 MED ORDER — METFORMIN HCL 500 MG PO TABS
500.0000 mg | ORAL_TABLET | Freq: Two times a day (BID) | ORAL | 0 refills | Status: AC
  Filled 2024-04-11 – 2024-04-21 (×2): qty 60, 30d supply, fill #0

## 2024-04-11 MED ORDER — CLONIDINE HCL 0.2 MG PO TABS
0.2000 mg | ORAL_TABLET | Freq: Two times a day (BID) | ORAL | 0 refills | Status: AC
  Filled 2024-04-11 – 2024-04-21 (×2): qty 60, 30d supply, fill #0

## 2024-04-11 MED ORDER — SERTRALINE HCL 100 MG PO TABS
150.0000 mg | ORAL_TABLET | Freq: Every day | ORAL | 0 refills | Status: AC
  Filled 2024-04-11 – 2024-04-21 (×2): qty 45, 30d supply, fill #0

## 2024-04-11 MED ORDER — MONTELUKAST SODIUM 10 MG PO TABS
10.0000 mg | ORAL_TABLET | Freq: Every day | ORAL | 0 refills | Status: AC
  Filled 2024-04-11 – 2024-04-21 (×2): qty 30, 30d supply, fill #0

## 2024-04-11 MED ORDER — LOSARTAN POTASSIUM 50 MG PO TABS
50.0000 mg | ORAL_TABLET | Freq: Every day | ORAL | 0 refills | Status: AC
  Filled 2024-04-11 – 2024-04-21 (×2): qty 30, 30d supply, fill #0

## 2024-04-11 MED ORDER — PANTOPRAZOLE SODIUM 40 MG PO TBEC
40.0000 mg | DELAYED_RELEASE_TABLET | Freq: Every day | ORAL | 0 refills | Status: AC
  Filled 2024-04-11 – 2024-04-21 (×2): qty 30, 30d supply, fill #0

## 2024-04-11 MED ORDER — HYDRALAZINE HCL 100 MG PO TABS
100.0000 mg | ORAL_TABLET | Freq: Four times a day (QID) | ORAL | 0 refills | Status: AC
  Filled 2024-04-11 – 2024-04-21 (×2): qty 120, 30d supply, fill #0

## 2024-04-11 MED ORDER — CYCLOBENZAPRINE HCL 10 MG PO TABS
10.0000 mg | ORAL_TABLET | Freq: Every day | ORAL | 0 refills | Status: AC
  Filled 2024-04-11 – 2024-04-21 (×2): qty 30, 30d supply, fill #0

## 2024-04-11 MED ORDER — AMLODIPINE BESYLATE 10 MG PO TABS
10.0000 mg | ORAL_TABLET | Freq: Every day | ORAL | 0 refills | Status: AC
  Filled 2024-04-11 – 2024-04-21 (×2): qty 30, 30d supply, fill #0

## 2024-04-11 NOTE — Progress Notes (Signed)
 She was seen for monthly FU. She has bilat hand pain with (+) Tinel's and Phalen's.  ? CTS. LEE:  on amlodipine .  Sits at work.  Eats salty snacks. VS: 130/70, 61, 96% RA.  RCBG 115.   Ok for refills she requested to pharmcy of choice. Ok for follow up 2/17 3p

## 2024-04-14 ENCOUNTER — Other Ambulatory Visit: Payer: Self-pay

## 2024-04-17 ENCOUNTER — Other Ambulatory Visit: Payer: Self-pay

## 2024-04-21 ENCOUNTER — Other Ambulatory Visit: Payer: Self-pay
